# Patient Record
Sex: Male | Born: 1955
Health system: Southern US, Community
[De-identification: ages and names within clinical notes are randomized; demographics above are authoritative.]

## PROBLEM LIST (undated history)

## (undated) DIAGNOSIS — E119 Type 2 diabetes mellitus without complications: Secondary | ICD-10-CM

## (undated) DIAGNOSIS — I10 Essential (primary) hypertension: Secondary | ICD-10-CM

## (undated) DIAGNOSIS — N179 Acute kidney failure, unspecified: Secondary | ICD-10-CM

## (undated) DIAGNOSIS — J302 Other seasonal allergic rhinitis: Secondary | ICD-10-CM

## (undated) DIAGNOSIS — M1990 Unspecified osteoarthritis, unspecified site: Secondary | ICD-10-CM

## (undated) DIAGNOSIS — K219 Gastro-esophageal reflux disease without esophagitis: Secondary | ICD-10-CM

## (undated) DIAGNOSIS — R55 Syncope and collapse: Secondary | ICD-10-CM

## (undated) DIAGNOSIS — E785 Hyperlipidemia, unspecified: Secondary | ICD-10-CM

## (undated) DIAGNOSIS — J189 Pneumonia, unspecified organism: Secondary | ICD-10-CM

## (undated) HISTORY — PX: BUNIONECTOMY WITH HAMMERTOE RECONSTRUCTION: SHX5600

## (undated) HISTORY — DX: Gastro-esophageal reflux disease without esophagitis: K21.9

## (undated) HISTORY — DX: Essential (primary) hypertension: I10

## (undated) HISTORY — DX: Hyperlipidemia, unspecified: E78.5

## (undated) HISTORY — PX: POLYPECTOMY: SHX149

---

## 2004-09-20 ENCOUNTER — Ambulatory Visit: Payer: Self-pay | Admitting: Internal Medicine

## 2004-09-27 ENCOUNTER — Ambulatory Visit: Payer: Self-pay | Admitting: Internal Medicine

## 2004-10-18 ENCOUNTER — Ambulatory Visit: Payer: Self-pay | Admitting: Gastroenterology

## 2004-10-27 ENCOUNTER — Ambulatory Visit: Payer: Self-pay | Admitting: Gastroenterology

## 2004-10-27 HISTORY — PX: COLONOSCOPY: SHX174

## 2005-09-01 ENCOUNTER — Ambulatory Visit: Payer: Self-pay | Admitting: Internal Medicine

## 2005-09-01 LAB — CONVERTED CEMR LAB: PSA: 0.48 ng/mL

## 2007-02-13 ENCOUNTER — Ambulatory Visit: Payer: Self-pay | Admitting: Internal Medicine

## 2007-03-13 ENCOUNTER — Ambulatory Visit: Payer: Self-pay | Admitting: Internal Medicine

## 2007-03-13 LAB — CONVERTED CEMR LAB
ALT: 28 units/L (ref 0–53)
Albumin: 3.9 g/dL (ref 3.5–5.2)
Basophils Relative: 5.5 % — ABNORMAL HIGH (ref 0.0–1.0)
Bilirubin, Direct: 0.1 mg/dL (ref 0.0–0.3)
Calcium: 9.7 mg/dL (ref 8.4–10.5)
Cholesterol: 142 mg/dL (ref 0–200)
Creatinine, Ser: 1.3 mg/dL (ref 0.4–1.5)
Eosinophils Relative: 3.8 % (ref 0.0–5.0)
GFR calc non Af Amer: 61 mL/min
HCT: 41.6 % (ref 39.0–52.0)
Hemoglobin: 14.4 g/dL (ref 13.0–17.0)
Lymphocytes Relative: 40.1 % (ref 12.0–46.0)
MCHC: 34.7 g/dL (ref 30.0–36.0)
Monocytes Relative: 5.9 % (ref 3.0–11.0)
Neutro Abs: 3.8 10*3/uL (ref 1.4–7.7)
Neutrophils Relative %: 44.7 % (ref 43.0–77.0)
RBC: 4.57 M/uL (ref 4.22–5.81)
RDW: 13.4 % (ref 11.5–14.6)
Sodium: 141 meq/L (ref 135–145)
Total Bilirubin: 0.7 mg/dL (ref 0.3–1.2)
Total CHOL/HDL Ratio: 2.9
VLDL: 20 mg/dL (ref 0–40)

## 2007-03-19 ENCOUNTER — Ambulatory Visit: Payer: Self-pay

## 2007-03-25 ENCOUNTER — Ambulatory Visit: Payer: Self-pay

## 2007-03-28 ENCOUNTER — Ambulatory Visit: Payer: Self-pay | Admitting: Internal Medicine

## 2007-04-01 ENCOUNTER — Encounter: Payer: Self-pay | Admitting: Internal Medicine

## 2007-04-01 DIAGNOSIS — E785 Hyperlipidemia, unspecified: Secondary | ICD-10-CM | POA: Insufficient documentation

## 2007-04-01 DIAGNOSIS — E669 Obesity, unspecified: Secondary | ICD-10-CM | POA: Insufficient documentation

## 2007-04-01 DIAGNOSIS — F411 Generalized anxiety disorder: Secondary | ICD-10-CM | POA: Insufficient documentation

## 2007-04-01 DIAGNOSIS — I10 Essential (primary) hypertension: Secondary | ICD-10-CM | POA: Insufficient documentation

## 2007-09-18 ENCOUNTER — Ambulatory Visit: Payer: Self-pay | Admitting: Internal Medicine

## 2007-09-18 DIAGNOSIS — E118 Type 2 diabetes mellitus with unspecified complications: Secondary | ICD-10-CM | POA: Insufficient documentation

## 2009-06-27 ENCOUNTER — Inpatient Hospital Stay (HOSPITAL_COMMUNITY): Admission: EM | Admit: 2009-06-27 | Discharge: 2009-06-28 | Payer: Self-pay | Admitting: Emergency Medicine

## 2009-07-05 ENCOUNTER — Ambulatory Visit: Payer: Self-pay | Admitting: Internal Medicine

## 2009-07-05 DIAGNOSIS — K5289 Other specified noninfective gastroenteritis and colitis: Secondary | ICD-10-CM | POA: Insufficient documentation

## 2009-09-13 ENCOUNTER — Ambulatory Visit: Payer: Self-pay | Admitting: Internal Medicine

## 2009-09-13 DIAGNOSIS — K6289 Other specified diseases of anus and rectum: Secondary | ICD-10-CM | POA: Insufficient documentation

## 2009-12-29 ENCOUNTER — Encounter: Payer: Self-pay | Admitting: Internal Medicine

## 2010-06-29 ENCOUNTER — Encounter: Payer: Self-pay | Admitting: Internal Medicine

## 2010-09-27 NOTE — Letter (Signed)
Summary: Dermatology/[facility]  Dermatology/[facility]   Imported By: Sherian Rein 01/27/2010 11:54:14  _____________________________________________________________________  External Attachment:    Type:   Image     Comment:   External Document

## 2010-09-27 NOTE — Assessment & Plan Note (Signed)
Summary: DEMANDING APPT TODAY WILL NOT WAIT FOR DR Jonny Ruiz FOR TOMORROW/CD   Vital Signs:  Patient profile:   55 year old male Height:      70 inches (177.80 cm) Weight:      231.8 pounds (105.36 kg) O2 Sat:      99 % on Room air Temp:     97.6 degrees F (36.44 degrees C) oral Pulse rate:   83 / minute BP sitting:   132 / 82  (left arm) Cuff size:   large  Vitals Entered By: Orlan Leavens (September 13, 2009 2:45 PM)  O2 Flow:  Room air CC: ? rash or hemorrhoids Is Patient Diabetic? No   Primary Care Provider:  Corwin Levins MD  CC:  ? rash or hemorrhoids.  History of Present Illness: here today with complaint of rectal pain -   . onset of symptoms was 2 months ago. course has been gradual onset and now occurs in intermittent pattern. problem precipitated by colitis hospitalization 06/2009 (hosp dc summ reviewed) symptom characterized as burning and itching in rectum problem associated with pain during BM but not associated with bleeding, constipation or fever. symptoms improved by nothing - tried OTC Prep H and hydrocortisone cream for rash. symptoms worsened with activity such as working out (running, [employer]). no prior hx of same symptoms before 06/2009 hospitalization.  reports normal colo 2006  Clinical Review Panels:  Prevention   Last Colonoscopy:  Done (10/27/2004)   Last PSA:  0.48 (09/01/2005)  CBC   WBC:  8.5 (03/13/2007)   RBC:  4.57 (03/13/2007)   Hgb:  14.4 (03/13/2007)   Hct:  41.6 (03/13/2007)   Platelets:  197 (03/13/2007)   MCV  90.8 (03/13/2007)   MCHC  34.7 (03/13/2007)   RDW  13.4 (03/13/2007)   PMN:  44.7 (03/13/2007)   Lymphs:  40.1 (03/13/2007)   Monos:  5.9 (03/13/2007)   Eosinophils:  3.8 (03/13/2007)   Basophil:  5.5 (03/13/2007)  Complete Metabolic Panel   Glucose:  88 (03/13/2007)   Sodium:  141 (03/13/2007)   Potassium:  4.1 (03/13/2007)   Chloride:  102 (03/13/2007)   CO2:  33 (03/13/2007)   BUN:  18 (03/13/2007)  Creatinine:  1.3 (03/13/2007)   Albumin:  3.9 (03/13/2007)   Total Protein:  7.4 (03/13/2007)   Calcium:  9.7 (03/13/2007)   Total Bili:  0.7 (03/13/2007)   Alk Phos:  70 (03/13/2007)   SGPT (ALT):  28 (03/13/2007)   SGOT (AST):  34 (03/13/2007)   Current Medications (verified): 1)  Ranitidine Hcl 150 Mg  Tabs (Ranitidine Hcl) .Marland Kitchen.. 1po Qd 2)  Pravastatin Sodium 40 Mg  Tabs (Pravastatin Sodium) .Marland Kitchen.. 1 By Mouth Qd 3)  Lisinopril 40 Mg  Tabs (Lisinopril) .Marland Kitchen.. 1 By Mouth Qd 4)  Hydrochlorothiazide 25 Mg  Tabs (Hydrochlorothiazide) .... Take 1 Tab By Mouth Every Morning  Allergies (verified): No Known Drug Allergies  Past History:  Past Medical History: Last updated: 09/18/2007 Anxiety Hyperlipidemia Hypertension Obesity Diabetes mellitus, type II hx of shingles  Review of Systems  The patient denies fever, chest pain, and headaches.    Physical Exam  General:  Well-developed,well-nourished,in no acute distress; alert,appropriate and cooperative throughout examination Lungs:  Normal respiratory effort, chest expands symmetrically. Lungs are clear to auscultation, no crackles or wheezes. Heart:  Normal rate and regular rhythm. S1 and S2 normal without gallop, murmur, click, rub or other extra sounds. Abdomen:  soft, non-tender, normal bowel sounds, no distention; no masses and  no appreciable hepatomegaly or splenomegaly.   Rectal:  no external abnormalities, no hemorrhoids, normal sphincter tone, no masses, and no perianal rash.  FOB negative (dark color, thin stool) - pain with digital exam Prostate:  Prostate gland firm and smooth, no enlargement, nodularity, tenderness, mass, asymmetry or induration. Neurologic:  mild flatness to right facial muscles and nasolabial fold  (?chronic bell's - pt denies any change in his appearance and face expression)   Impression & Recommendations:  Problem # 1:  ANAL OR RECTAL PAIN (ICD-569.42)  high fissure or hemrrhoid most likely -  only pain appreciated on exam no hx of blood/melena or fever - doubt persisiting infx (hosp dc summary reviewed from 06/2009) will treat with analpram to reduce inflammation -  pt educated on keeping stools soft and avoid constipation/straining if still with symptoms >2 weeks, call for referral to GI as persisitng symptoms since colitis hosp 06/2009 (prior colo 2006 normal)  Orders: Hemoccult Guaiac-1 spec.(in office) ([id number]) Prescription Created Electronically [phone])  Complete Medication List: 1)  Ranitidine Hcl 150 Mg Tabs (Ranitidine hcl) .Marland Kitchen.. 1po qd 2)  Pravastatin Sodium 40 Mg Tabs (Pravastatin sodium) .Marland Kitchen.. 1 by mouth qd 3)  Lisinopril 40 Mg Tabs (Lisinopril) .Marland Kitchen.. 1 by mouth qd 4)  Hydrochlorothiazide 25 Mg Tabs (Hydrochlorothiazide) .... Take 1 tab by mouth every morning 5)  Losartan Potassium 100 Mg Tabs (Losartan potassium) .... Take 1 by mouth qd 6)  Analpram-hc Singles 1-2.5 % Crea (Hydrocortisone ace-pramoxine) .Marland Kitchen.. 1 per rectum two times a day x 10 days  Patient Instructions: 1)  it was good to see you today.  2)  try analpram suppository treatments for your pain -  3)  avoid constipation and straining, may use stool softeners as needed to help with this 4)  if continued symptoms after treatment, call for referral to GI to consider other evaluation and treatment Prescriptions: ANALPRAM-HC SINGLES 1-2.5 % CREA (HYDROCORTISONE ACE-PRAMOXINE) 1 per rectum two times a day x 10 days  #20 x 0   Entered by:   Orlan Leavens   Authorized by:   Newt Lukes MD   Signed by:   Newt Lukes MD on 09/13/2009   Method used:   Electronically to        [facility] [phone]* (retail)       [address]       [place], [place]  [address]       Ph: [phone] or [phone]       Fax: [phone]   RxID:   [id number] ANALPRAM-HC SINGLES 1-2.5 % CREA (HYDROCORTISONE ACE-PRAMOXINE) 1 per rectum two times a day x 10 days  #20 x 0   Entered and Authorized by:   Newt Lukes  MD   Signed by:   Newt Lukes MD on 09/13/2009   Method used:   Historical   RxID:   [id number]

## 2010-10-13 NOTE — Letter (Signed)
Summary: Dermatology/[facility]  Dermatology/[facility]   Imported By: Sherian Rein 10/06/2010 10:42:12  _____________________________________________________________________  External Attachment:    Type:   Image     Comment:   External Document

## 2010-11-30 LAB — CBC
HCT: 43.3 % (ref 39.0–52.0)
MCHC: 33.3 g/dL (ref 30.0–36.0)
RDW: 14.1 % (ref 11.5–15.5)

## 2010-11-30 LAB — DIFFERENTIAL
Basophils Absolute: 0 10*3/uL (ref 0.0–0.1)
Basophils Relative: 0 % (ref 0–1)
Eosinophils Absolute: 0 10*3/uL (ref 0.0–0.7)
Lymphocytes Relative: 22 % (ref 12–46)
Lymphs Abs: 2.1 10*3/uL (ref 0.7–4.0)
Neutrophils Relative %: 72 % (ref 43–77)

## 2010-11-30 LAB — COMPREHENSIVE METABOLIC PANEL
AST: 37 U/L (ref 0–37)
Alkaline Phosphatase: 82 U/L (ref 39–117)
BUN: 11 mg/dL (ref 6–23)
Calcium: 8.6 mg/dL (ref 8.4–10.5)
Chloride: 105 mEq/L (ref 96–112)
Creatinine, Ser: 1.15 mg/dL (ref 0.4–1.5)
GFR calc non Af Amer: >60 mL/min (ref >60)
Glucose, Bld: 123 mg/dL — ABNORMAL HIGH (ref 70–99)
Sodium: 139 mEq/L (ref 135–145)
Total Bilirubin: 0.7 mg/dL (ref 0.3–1.2)
Total Protein: 6.4 g/dL (ref 6.0–8.3)

## 2010-11-30 LAB — MAGNESIUM: Magnesium: 2.1 mg/dL (ref 1.5–2.5)

## 2010-12-01 LAB — URINALYSIS, ROUTINE W REFLEX MICROSCOPIC
Bilirubin Urine: NEGATIVE
Hgb urine dipstick: NEGATIVE
Nitrite: NEGATIVE
Protein, ur: NEGATIVE mg/dL
pH: 7 (ref 5.0–8.0)

## 2010-12-01 LAB — COMPREHENSIVE METABOLIC PANEL
AST: 36 U/L (ref 0–37)
Albumin: 3.8 g/dL (ref 3.5–5.2)
Alkaline Phosphatase: 95 U/L (ref 39–117)
CO2: 28 mEq/L (ref 19–32)
Calcium: 9.1 mg/dL (ref 8.4–10.5)
Glucose, Bld: 130 mg/dL — ABNORMAL HIGH (ref 70–99)
Potassium: 4.2 mEq/L (ref 3.5–5.1)
Sodium: 135 mEq/L (ref 135–145)
Total Bilirubin: 0.8 mg/dL (ref 0.3–1.2)
Total Protein: 7.1 g/dL (ref 6.0–8.3)

## 2010-12-01 LAB — CBC
HCT: 45 % (ref 39.0–52.0)
Hemoglobin: 15.1 g/dL (ref 13.0–17.0)
MCHC: 33.6 g/dL (ref 30.0–36.0)
RBC: 4.85 MIL/uL (ref 4.22–5.81)
RDW: 13.8 % (ref 11.5–15.5)

## 2010-12-01 LAB — HEMOCCULT GUIAC POC 1CARD (OFFICE): Fecal Occult Bld: NEGATIVE

## 2010-12-01 LAB — DIFFERENTIAL: Monocytes Absolute: 0.4 10*3/uL (ref 0.1–1.0)

## 2010-12-01 LAB — URINE MICROSCOPIC-ADD ON

## 2011-01-10 NOTE — Letter (Signed)
March 28, 2007    Brandon Shannon Levins, MD  [address]  [place], [place] [address]   RE:  Brandon Shannon, Shannon  MRN:  [id number]  /  DOB:  1955-12-13   Dear [name],   It was a pleasure to see Brandon Shannon Shannon today at your request because of  recurrent syncope.   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  He is the  father of two, who has a history of syncope that dates back five or ten  years.  These are somewhat different from each other.  The first episode  occurred after having gone to the dentist.  He drove home.  He took pain  medications; he had not eaten that morning.  He then awakened on the  floor.  He is not able to recount any significant prodromal symptoms.  He does not recount any significant recovery symptoms.   His next episode occurred while his wife was cutting his hair.  This is  not a stressful experience for him, as it was for me.  He found himself  sliding to the floor, unconscious.  He was not unconscious long.  Again,  there was no recognizable prodrome and no recognized symptoms from the  recovery phase and no recognition that the recovery phase of these  episodes was similar.   More recently, he got up in the middle of the night to go to the  bathroom, specifically to urinate.  He got halfway to the bathroom,  became very diaphoretic and light-headed.  He slumped to the floor.   He has had another episode of presyncope, the specifics of which he does  not recall, that was associated with a prodrome similar to this last  episode.   His cardiac risk factors are notable for diabetes and hypertension and  you, very appropriately, undertook a cardiac evaluation, including an  ultrasound, that demonstrated normal left ventricular function and a  Myoview that demonstrated soft-tissue attenuation, but no scar or  ischemia.   PAST MEDICAL HISTORY:  In addition to the above, is notable for  allergies.   PAST SURGICAL HISTORY:  Notable for foot surgery.   SOCIAL HISTORY:  As noted previously, he has two children, 12 and 14,  does not use cigarettes, alcohol or recreational drugs.   MEDICATIONS INCLUDE:  1. Zantac.  2. Prevacid.  3. Lisinopril 20.  4. Hydrochlorothiazide 25.  5. Metformin 500.   ALLERGIES:  He has no known drug allergies.   EXAMINATION:  He is a middle-aged African-American male, appearing his  stated age of 35.  His blood pressure was 117/76 with a pulse of 78.  There was minimal  change of orthostasis with a slight increase in heart rate to the mid-  eighties.  His HEENT exam demonstrated no icterus or xanthoma.  The neck veins were flat.  The carotids were brisk and full bilaterally,  without bruits.  The back was without kyphosis or scoliosis.  The lungs were clear.  Heart sounds were regular, without murmurs or gallops.  The abdomen was soft, but protuberant.  No midline pulsation was  appreciated.  There was no hepatomegaly.  Femoral pulses were 2+, distal pulses were intact.  There was no  clubbing, cyanosis or edema.  The neurological exam was grossly normal.  The skin had significant keloid formation on his anterior chest wall  from no specific trauma.   Electrocardiogram from his stress test demonstrated sinus rhythm at 82  with intervals  of 0.16/0.08/0.43.  The electrocardiogram was otherwise  normal.   IMPRESSION:  1. Recurrent syncope, probably neurally mediated.  2. Cardiac risk factors including diabetes and hypertension.  3. Obesity.   [name], I think these episodes are neurally mediated, although the fact  that they lack a stereotypical prodrome is concerning to me.  Still,  things being common, this is the most likely explanation.   From a diagnostic point of view, without things present themselves.  One  is tilt table testing and the other is an implantable loop recorder,  probably to be done in that order.  I have reviewed this with the  patient and he is not interested in pursuing  further diagnostic testing  at this time.  I think that is a not unreasonable decision.   From a therapeutic standpoint, it is hard to know for sure as this  prodrome is so short.  I have encouraged him to try to recognize any  prodromal stigmata, so that he can protect himself from syncope.   I have left him to follow up with you.   We also had a discussion regarding his obesity, diabetes and  hypertension and the importance for exercise.  Specifically now that he  is unemployed, I have encouraged him to find time to exercise on a  regular basis, trying to accomplish an hour a day.   I have also asked him to speak with you and with his wife as to whether  he may have obstructive sleep apnea and whether we might be able to  ameliorate some of his symptoms thereby.   Thanks for the consultation.    Sincerely,      Brandon Shannon Salvia, MD, [facility]  Electronically Signed    SCK/MedQ  DD: 03/28/2007  DT: 03/29/2007  Job #: [id number]

## 2011-09-19 ENCOUNTER — Encounter: Payer: Self-pay | Admitting: Gastroenterology

## 2012-07-25 LAB — HM DIABETES EYE EXAM: HM Diabetic Eye Exam: NORMAL

## 2013-03-31 ENCOUNTER — Telehealth: Payer: Self-pay | Admitting: Internal Medicine

## 2013-03-31 NOTE — Telephone Encounter (Signed)
Patient would like to switch from Dr. Jonny Ruiz to Dr. Yetta Barre because that is who is wife is seeing, please advise

## 2013-03-31 NOTE — Telephone Encounter (Signed)
Ok with me 

## 2013-03-31 NOTE — Telephone Encounter (Signed)
ok 

## 2013-03-31 NOTE — Telephone Encounter (Signed)
lmom to call

## 2013-04-02 ENCOUNTER — Other Ambulatory Visit (INDEPENDENT_AMBULATORY_CARE_PROVIDER_SITE_OTHER)

## 2013-04-02 ENCOUNTER — Ambulatory Visit (INDEPENDENT_AMBULATORY_CARE_PROVIDER_SITE_OTHER): Admitting: Internal Medicine

## 2013-04-02 ENCOUNTER — Encounter: Payer: Self-pay | Admitting: Internal Medicine

## 2013-04-02 VITALS — BP 118/72 | HR 78 | Temp 97.0°F | Resp 16 | Ht 69.0 in | Wt 238.0 lb

## 2013-04-02 DIAGNOSIS — E785 Hyperlipidemia, unspecified: Secondary | ICD-10-CM

## 2013-04-02 DIAGNOSIS — R109 Unspecified abdominal pain: Secondary | ICD-10-CM

## 2013-04-02 DIAGNOSIS — Z136 Encounter for screening for cardiovascular disorders: Secondary | ICD-10-CM

## 2013-04-02 DIAGNOSIS — E669 Obesity, unspecified: Secondary | ICD-10-CM

## 2013-04-02 DIAGNOSIS — IMO0001 Reserved for inherently not codable concepts without codable children: Secondary | ICD-10-CM

## 2013-04-02 DIAGNOSIS — I10 Essential (primary) hypertension: Secondary | ICD-10-CM

## 2013-04-02 DIAGNOSIS — Z23 Encounter for immunization: Secondary | ICD-10-CM

## 2013-04-02 DIAGNOSIS — E559 Vitamin D deficiency, unspecified: Secondary | ICD-10-CM | POA: Insufficient documentation

## 2013-04-02 LAB — COMPREHENSIVE METABOLIC PANEL
ALT: 28 U/L (ref 0–53)
Albumin: 4.1 g/dL (ref 3.5–5.2)
Alkaline Phosphatase: 82 U/L (ref 39–117)
Glucose, Bld: 84 mg/dL (ref 70–99)
Potassium: 3.7 mEq/L (ref 3.5–5.1)
Sodium: 139 mEq/L (ref 135–145)
Total Protein: 7.8 g/dL (ref 6.0–8.3)

## 2013-04-02 LAB — URINALYSIS, ROUTINE W REFLEX MICROSCOPIC
Bilirubin Urine: NEGATIVE
Hgb urine dipstick: NEGATIVE
Nitrite: NEGATIVE
Urobilinogen, UA: 0.2 (ref 0.0–1.0)

## 2013-04-02 LAB — CBC
BASO#: 0 /uL
BASO%: 0 %
EOS%: 3 %
HCT: 44 %
MCHC: 30.2
MCV: 87.7 fL
MONO%: 6 %
NEUT#: 3.38
NEUT%: 46 %
Platelets: 178 10*3/uL
RBC: 5.04
RDW-CV: 13.2
lymph#: 3.18

## 2013-04-02 LAB — BASIC METABOLIC PANEL
Albumin: 3.8
Glucose: 110
Urea Nitrogen: 13

## 2013-04-02 LAB — HEPATIC FUNCTION PANEL
AST: 25 U/L
Albumin: 3.8
Alkaline Phosphatase: 112 U/L
Bilirubin, Direct: 0.1 mg/dL (ref 0.01–0.4)
Protein: 8.3

## 2013-04-02 LAB — CBC WITH DIFFERENTIAL/PLATELET
Basophils Absolute: 0.1 10*3/uL (ref 0.0–0.1)
Eosinophils Absolute: 0.4 10*3/uL (ref 0.0–0.7)
Lymphs Abs: 3.5 10*3/uL (ref 0.7–4.0)
MCHC: 33.6 g/dL (ref 30.0–36.0)
MCV: 90.7 fl (ref 78.0–100.0)
Monocytes Absolute: 0.6 10*3/uL (ref 0.1–1.0)
Neutrophils Relative %: 51.9 % (ref 43.0–77.0)
Platelets: 199 10*3/uL (ref 150.0–400.0)
RDW: 13.8 % (ref 11.5–14.6)
WBC: 9.5 10*3/uL (ref 4.5–10.5)

## 2013-04-02 LAB — PSA: PSA: 0.594

## 2013-04-02 LAB — LIPID PANEL
Cholesterol, Total: 219
HDL: 62 mg/dL (ref 35–70)

## 2013-04-02 LAB — HM DIABETES FOOT EXAM

## 2013-04-02 LAB — AMYLASE: Amylase: 103 U/L (ref 27–131)

## 2013-04-02 MED ORDER — CHOLECALCIFEROL 1.25 MG (50000 UT) PO TABS: 1.0000 | ORAL_TABLET | ORAL | Status: DC

## 2013-04-02 MED ORDER — PITAVASTATIN CALCIUM 4 MG PO TABS: 1.0000 | ORAL_TABLET | Freq: Every day | ORAL | Status: DC

## 2013-04-02 NOTE — Progress Notes (Signed)
Subjective:    Patient ID: Brandon Shannon, male    DOB: [birth year], 56 y.o.   MRN: [id number]  HPI Comments: He was cutting the grass last weekend and while exerting himself he developed nausea, vomiting, loose BM, and lightheadedness. Those symptoms have since resolved.  Abdominal Pain This is a recurrent problem. The current episode started more than 1 month ago. The onset quality is gradual. The problem occurs intermittently. The problem has been gradually worsening. The pain is located in the generalized abdominal region. The pain is at a severity of 2/10. The pain is mild. The quality of the pain is aching and sharp. The abdominal pain does not radiate. Associated symptoms include nausea and vomiting. Pertinent negatives include no anorexia, arthralgias, belching, constipation, diarrhea, dysuria, fever, flatus, frequency, headaches, hematochezia, hematuria or myalgias. Associated symptoms comments: He had an episode of nausea and vomiting 3 days ago and since then he has felt fatigued and weak..      Review of Systems  Constitutional: Negative.  Negative for fever, chills, diaphoresis, appetite change and fatigue.  HENT: Negative.   Eyes: Negative.   Respiratory: Negative.  Negative for cough, chest tightness, shortness of breath, wheezing and stridor.   Cardiovascular: Negative.  Negative for chest pain, palpitations and leg swelling.  Gastrointestinal: Positive for nausea, vomiting and abdominal pain. Negative for diarrhea, constipation, hematochezia, abdominal distention, anal bleeding, anorexia and flatus.  Endocrine: Negative.  Negative for polydipsia and polyuria.  Genitourinary: Negative.  Negative for dysuria, frequency and hematuria.  Musculoskeletal: Negative.  Negative for myalgias, back pain, joint swelling, arthralgias and gait problem.  Skin: Negative.   Allergic/Immunologic: Negative.   Neurological: Positive for dizziness and light-headedness. Negative for tremors,  seizures, syncope, facial asymmetry, speech difficulty, weakness, numbness and headaches.  Hematological: Negative.  Negative for adenopathy. Does not bruise/bleed easily.  Psychiatric/Behavioral: Negative.        Objective:   Physical Exam  Vitals reviewed. Constitutional: He is oriented to person, place, and time. He appears well-developed and well-nourished. No distress.  HENT:  Head: Normocephalic and atraumatic.  Mouth/Throat: Oropharynx is clear and moist. No oropharyngeal exudate.  Eyes: Conjunctivae are normal. Right eye exhibits no discharge. Left eye exhibits no discharge. No scleral icterus.  Neck: Normal range of motion. Neck supple. No JVD present. No tracheal deviation present. No thyromegaly present.  Cardiovascular: Normal rate, regular rhythm, normal heart sounds and intact distal pulses.  Exam reveals no gallop and no friction rub.   No murmur heard. Pulmonary/Chest: Effort normal and breath sounds normal. No stridor. No respiratory distress. He has no wheezes. He has no rales. He exhibits no tenderness.  Abdominal: Soft. Bowel sounds are normal. He exhibits no distension and no mass. There is no tenderness. There is no rebound and no guarding.  Musculoskeletal: Normal range of motion. He exhibits no edema and no tenderness.  Lymphadenopathy:    He has no cervical adenopathy.  Neurological: He is oriented to person, place, and time.  Skin: Skin is warm and dry. No rash noted. He is not diaphoretic. No erythema. No pallor.  Psychiatric: He has a normal mood and affect. His behavior is normal. Judgment and thought content normal.     Lab Results  Component Value Date   WBC 7.28 03/13/2013   HGB 15.2 03/13/2013   HCT 44 03/13/2013   PLT 178 03/13/2013   GLUCOSE 123* 06/28/2009   CHOL 142 03/13/2007   TRIG 78 03/13/2013   HDL 62 03/13/2013   LDLCALC  141 03/13/2013   ALT 27 06/28/2009   AST 37 06/28/2009   NA 139 06/28/2009   K 4.4 06/28/2009   CL 105 06/28/2009    CREATININE 1.15 06/28/2009   BUN 11 06/28/2009   CO2 27 06/28/2009   TSH 0.54 03/13/2007   PSA 0.594 03/13/2013   HGBA1C 6.2* 03/13/2007       Assessment & Plan:

## 2013-04-02 NOTE — Patient Instructions (Signed)

## 2013-04-03 ENCOUNTER — Telehealth: Payer: Self-pay | Admitting: *Deleted

## 2013-04-03 DIAGNOSIS — E785 Hyperlipidemia, unspecified: Secondary | ICD-10-CM

## 2013-04-03 DIAGNOSIS — IMO0001 Reserved for inherently not codable concepts without codable children: Secondary | ICD-10-CM

## 2013-04-03 MED ORDER — ATORVASTATIN CALCIUM 80 MG PO TABS: 80.0000 mg | ORAL_TABLET | Freq: Every day | ORAL | Status: DC

## 2013-04-03 NOTE — Telephone Encounter (Signed)
Please call in the atorvastatin

## 2013-04-03 NOTE — Telephone Encounter (Signed)
Insurance is requiring a prior auth for Livalo 4mg . They are requiring that the pt must try one of the following meds first: Atorvastatin Atorvastatin-amlodipine Lovastatin Pravastatin simvastatin

## 2013-04-03 NOTE — Telephone Encounter (Signed)
rx called into pharmacy

## 2013-04-04 NOTE — Assessment & Plan Note (Addendum)
His BP is well controlled His EKG is normal

## 2013-04-04 NOTE — Assessment & Plan Note (Addendum)
His exam is benign today Labs are normal I will order an u/s to see if he has gallstones

## 2013-04-04 NOTE — Assessment & Plan Note (Signed)
I have asked him to start lipitor

## 2013-04-04 NOTE — Assessment & Plan Note (Signed)
He will work on his lifestyle modifications to lose weight 

## 2013-04-04 NOTE — Assessment & Plan Note (Signed)
He will start cholecalciferol

## 2013-04-04 NOTE — Assessment & Plan Note (Signed)
His recent A1C shows good control of the blood sugar

## 2013-04-10 ENCOUNTER — Encounter: Payer: Self-pay | Admitting: Internal Medicine

## 2013-04-10 ENCOUNTER — Ambulatory Visit
Admission: RE | Admit: 2013-04-10 | Discharge: 2013-04-10 | Disposition: A | Discharge status: Home / Self Care | Source: Ambulatory Visit | Attending: Internal Medicine | Admitting: Internal Medicine

## 2013-04-10 DIAGNOSIS — R109 Unspecified abdominal pain: Secondary | ICD-10-CM

## 2013-04-21 ENCOUNTER — Ambulatory Visit (INDEPENDENT_AMBULATORY_CARE_PROVIDER_SITE_OTHER): Admitting: Internal Medicine

## 2013-04-21 ENCOUNTER — Encounter: Payer: Self-pay | Admitting: Internal Medicine

## 2013-04-21 VITALS — BP 116/70 | HR 77 | Temp 98.7°F | Resp 16 | Ht 71.0 in | Wt 240.0 lb

## 2013-04-21 DIAGNOSIS — E785 Hyperlipidemia, unspecified: Secondary | ICD-10-CM

## 2013-04-21 DIAGNOSIS — IMO0001 Reserved for inherently not codable concepts without codable children: Secondary | ICD-10-CM

## 2013-04-21 DIAGNOSIS — E669 Obesity, unspecified: Secondary | ICD-10-CM

## 2013-04-21 DIAGNOSIS — I10 Essential (primary) hypertension: Secondary | ICD-10-CM

## 2013-04-21 MED ORDER — ATORVASTATIN CALCIUM 80 MG PO TABS: 40.0000 mg | ORAL_TABLET | Freq: Every day | ORAL | Status: DC

## 2013-04-21 NOTE — Assessment & Plan Note (Signed)
He agrees to improve his lifestyle modifications

## 2013-04-21 NOTE — Assessment & Plan Note (Signed)
His BP is well controlled 

## 2013-04-21 NOTE — Patient Instructions (Signed)
Type 2 Diabetes Mellitus, Adult Type 2 diabetes mellitus, often simply referred to as type 2 diabetes, is a long-lasting (chronic) disease. In type 2 diabetes, the pancreas does not make enough insulin (a hormone), the cells are less responsive to the insulin that is made (insulin resistance), or both. Normally, insulin moves sugars from food into the tissue cells. The tissue cells use the sugars for energy. The lack of insulin or the lack of normal response to insulin causes excess sugars to build up in the blood instead of going into the tissue cells. As a result, high blood sugar (hyperglycemia) develops. The effect of high sugar (glucose) levels can cause many complications. Type 2 diabetes was also previously called adult-onset diabetes but it can occur at any age.  RISK FACTORS  A person is predisposed to developing type 2 diabetes if someone in the family has the disease and also has one or more of the following primary risk factors:  Overweight.  An inactive lifestyle.  A history of consistently eating high-calorie foods. Maintaining a normal weight and regular physical activity can reduce the chance of developing type 2 diabetes. SYMPTOMS  A person with type 2 diabetes may not show symptoms initially. The symptoms of type 2 diabetes appear slowly. The symptoms include:  Increased thirst (polydipsia).  Increased urination (polyuria).  Increased urination during the night (nocturia).  Weight loss. This weight loss may be rapid.  Frequent, recurring infections.  Tiredness (fatigue).  Weakness.  Vision changes, such as blurred vision.  Fruity smell to your breath.  Abdominal pain.  Nausea or vomiting.  Cuts or bruises which are slow to heal.  Tingling or numbness in the hands or feet. DIAGNOSIS Type 2 diabetes is frequently not diagnosed until complications of diabetes are present. Type 2 diabetes is diagnosed when symptoms or complications are present and when blood  glucose levels are increased. Your blood glucose level may be checked by one or more of the following blood tests:  A fasting blood glucose test. You will not be allowed to eat for at least 8 hours before a blood sample is taken.  A random blood glucose test. Your blood glucose is checked at any time of the day regardless of when you ate.  A hemoglobin A1c blood glucose test. A hemoglobin A1c test provides information about blood glucose control over the previous 3 months.  An oral glucose tolerance test (OGTT). Your blood glucose is measured after you have not eaten (fasted) for 2 hours and then after you drink a glucose-containing beverage. TREATMENT   You may need to take insulin or diabetes medicine daily to keep blood glucose levels in the desired range.  You will need to match insulin dosing with exercise and healthy food choices. The treatment goal is to maintain the before meal blood sugar (preprandial glucose) level at 70 130 mg/dL. HOME CARE INSTRUCTIONS   Have your hemoglobin A1c level checked twice a year.  Perform daily blood glucose monitoring as directed by your caregiver.  Monitor urine ketones when you are ill and as directed by your caregiver.  Take your diabetes medicine or insulin as directed by your caregiver to maintain your blood glucose levels in the desired range.  Never run out of diabetes medicine or insulin. It is needed every day.  Adjust insulin based on your intake of carbohydrates. Carbohydrates can raise blood glucose levels but need to be included in your diet. Carbohydrates provide vitamins, minerals, and fiber which are an essential part of   a healthy diet. Carbohydrates are found in fruits, vegetables, whole grains, dairy products, legumes, and foods containing added sugars.    Eat healthy foods. Alternate 3 meals with 3 snacks.  Lose weight if overweight.  Carry a medical alert card or wear your medical alert jewelry.  Carry a 15 gram  carbohydrate snack with you at all times to treat low blood glucose (hypoglycemia). Some examples of 15 gram carbohydrate snacks include:  Glucose tablets, 3 or 4   Glucose gel, 15 gram tube  Raisins, 2 tablespoons (24 grams)  Jelly beans, 6  Animal crackers, 8  Regular pop, 4 ounces (120 mL)  Gummy treats, 9  Recognize hypoglycemia. Hypoglycemia occurs with blood glucose levels of 70 mg/dL and below. The risk for hypoglycemia increases when fasting or skipping meals, during or after intense exercise, and during sleep. Hypoglycemia symptoms can include:  Tremors or shakes.  Decreased ability to concentrate.  Sweating.  Increased heart rate.  Headache.  Dry mouth.  Hunger.  Irritability.  Anxiety.  Restless sleep.  Altered speech or coordination.  Confusion.  Treat hypoglycemia promptly. If you are alert and able to safely swallow, follow the 15:15 rule:  Take 15 20 grams of rapid-acting glucose or carbohydrate. Rapid-acting options include glucose gel, glucose tablets, or 4 ounces (120 mL) of fruit juice, regular soda, or low fat milk.  Check your blood glucose level 15 minutes after taking the glucose.  Take 15 20 grams more of glucose if the repeat blood glucose level is still 70 mg/dL or below.  Eat a meal or snack within 1 hour once blood glucose levels return to normal.    Be alert to polyuria and polydipsia which are early signs of hyperglycemia. An early awareness of hyperglycemia allows for prompt treatment. Treat hyperglycemia as directed by your caregiver.  Engage in at least 150 minutes of moderate-intensity physical activity a week, spread over at least 3 days of the week or as directed by your caregiver. In addition, you should engage in resistance exercise at least 2 times a week or as directed by your caregiver.  Adjust your medicine and food intake as needed if you start a new exercise or sport.  Follow your sick day plan at any time you  are unable to eat or drink as usual.  Avoid tobacco use.  Limit alcohol intake to no more than 1 drink per day for nonpregnant women and 2 drinks per day for men. You should drink alcohol only when you are also eating food. Talk with your caregiver whether alcohol is safe for you. Tell your caregiver if you drink alcohol several times a week.  Follow up with your caregiver regularly.  Schedule an eye exam soon after the diagnosis of type 2 diabetes and then annually.  Perform daily skin and foot care. Examine your skin and feet daily for cuts, bruises, redness, nail problems, bleeding, blisters, or sores. A foot exam by a caregiver should be done annually.  Brush your teeth and gums at least twice a day and floss at least once a day. Follow up with your dentist regularly.  Share your diabetes management plan with your workplace or school.  Stay up-to-date with immunizations.  Learn to manage stress.  Obtain ongoing diabetes education and support as needed.  Participate in, or seek rehabilitation as needed to maintain or improve independence and quality of life. Request a physical or occupational therapy referral if you are having foot or hand numbness or difficulties with grooming,   dressing, eating, or physical activity. SEEK MEDICAL CARE IF:   You are unable to eat food or drink fluids for more than 6 hours.  You have nausea and vomiting for more than 6 hours.  Your blood glucose level is over 240 mg/dL.  There is a change in mental status.  You develop an additional serious illness.  You have diarrhea for more than 6 hours.  You have been sick or have had a fever for a couple of days and are not getting better.  You have pain during any physical activity.  SEEK IMMEDIATE MEDICAL CARE IF:  You have difficulty breathing.  You have moderate to large ketone levels. MAKE SURE YOU:  Understand these instructions.  Will watch your condition.  Will get help right away if  you are not doing well or get worse. Document Released: 08/14/2005 Document Revised: 05/08/2012 Document Reviewed: 03/12/2012 [employer] Patient Information 2014 [employer].  

## 2013-04-21 NOTE — Assessment & Plan Note (Signed)
No meds needed at this time He will work on his lifestyle modifications

## 2013-04-21 NOTE — Progress Notes (Signed)
  Subjective:    Patient ID: Brandon Shannon, male    DOB: Sep 26, 1955, 57 y.o.   MRN: [id number]  Hypertension This is a chronic problem. The current episode started more than 1 year ago. The problem has been gradually improving since onset. The problem is controlled. Pertinent negatives include no anxiety, blurred vision, chest pain, headaches, malaise/fatigue, neck pain, orthopnea, palpitations, peripheral edema, PND, shortness of breath or sweats. Past treatments include angiotensin blockers and diuretics. The current treatment provides significant improvement. Compliance problems include exercise and diet.       Review of Systems  Constitutional: Negative.  Negative for malaise/fatigue.  HENT: Negative.  Negative for neck pain.   Eyes: Negative.  Negative for blurred vision.  Respiratory: Negative.  Negative for apnea, cough, choking, chest tightness, shortness of breath, wheezing and stridor.   Cardiovascular: Negative.  Negative for chest pain, palpitations, orthopnea, leg swelling and PND.  Gastrointestinal: Negative.  Negative for nausea, vomiting, abdominal pain, diarrhea and constipation.  Endocrine: Negative.  Negative for polydipsia, polyphagia and polyuria.  Genitourinary: Negative.   Musculoskeletal: Negative.   Skin: Negative.   Allergic/Immunologic: Negative.   Neurological: Negative.  Negative for dizziness, syncope, weakness, light-headedness and headaches.  Hematological: Negative.  Negative for adenopathy. Does not bruise/bleed easily.  Psychiatric/Behavioral: Negative.        Objective:   Physical Exam  Vitals reviewed. Constitutional: He is oriented to person, place, and time. He appears well-developed and well-nourished. No distress.  HENT:  Head: Normocephalic and atraumatic.  Mouth/Throat: Oropharynx is clear and moist. No oropharyngeal exudate.  Eyes: Conjunctivae are normal. Right eye exhibits no discharge. Left eye exhibits no discharge. No scleral icterus.   Neck: Normal range of motion. Neck supple. No JVD present. No tracheal deviation present. No thyromegaly present.  Cardiovascular: Normal rate, regular rhythm, normal heart sounds and intact distal pulses.  Exam reveals no gallop and no friction rub.   No murmur heard. Pulmonary/Chest: Effort normal and breath sounds normal. No stridor. No respiratory distress. He has no wheezes. He has no rales. He exhibits no tenderness.  Abdominal: Soft. Bowel sounds are normal. He exhibits no distension and no mass. There is no tenderness. There is no rebound and no guarding.  Musculoskeletal: Normal range of motion. He exhibits no edema and no tenderness.  Lymphadenopathy:    He has no cervical adenopathy.  Neurological: He is oriented to person, place, and time.  Skin: Skin is warm and dry. No rash noted. He is not diaphoretic. No erythema. No pallor.  Psychiatric: He has a normal mood and affect. His behavior is normal. Judgment and thought content normal.     Lab Results  Component Value Date   WBC 9.5 04/02/2013   HGB 15.3 04/02/2013   HCT 45.6 04/02/2013   PLT 199.0 04/02/2013   GLUCOSE 84 04/02/2013   CHOL 142 03/13/2007   TRIG 78 03/13/2013   HDL 62 03/13/2013   LDLCALC 141 03/13/2013   ALT 28 04/02/2013   AST 25 04/02/2013   NA 139 04/02/2013   K 3.7 04/02/2013   CL 104 04/02/2013   CREATININE 1.2 04/02/2013   BUN 18 04/02/2013   CO2 27 04/02/2013   TSH 0.54 03/13/2007   PSA 0.594 03/13/2013   HGBA1C 6.5* 03/13/2013       Assessment & Plan:

## 2013-07-31 ENCOUNTER — Other Ambulatory Visit: Payer: Self-pay | Admitting: Internal Medicine

## 2013-09-02 ENCOUNTER — Encounter: Payer: Self-pay | Admitting: Internal Medicine

## 2013-09-02 ENCOUNTER — Other Ambulatory Visit (INDEPENDENT_AMBULATORY_CARE_PROVIDER_SITE_OTHER)

## 2013-09-02 ENCOUNTER — Ambulatory Visit (INDEPENDENT_AMBULATORY_CARE_PROVIDER_SITE_OTHER): Admitting: Internal Medicine

## 2013-09-02 VITALS — BP 126/82 | HR 64 | Temp 97.5°F | Resp 16 | Ht 70.0 in | Wt 242.0 lb

## 2013-09-02 DIAGNOSIS — E559 Vitamin D deficiency, unspecified: Secondary | ICD-10-CM

## 2013-09-02 DIAGNOSIS — E785 Hyperlipidemia, unspecified: Secondary | ICD-10-CM

## 2013-09-02 DIAGNOSIS — IMO0001 Reserved for inherently not codable concepts without codable children: Secondary | ICD-10-CM

## 2013-09-02 DIAGNOSIS — E1165 Type 2 diabetes mellitus with hyperglycemia: Principal | ICD-10-CM

## 2013-09-02 DIAGNOSIS — I10 Essential (primary) hypertension: Secondary | ICD-10-CM

## 2013-09-02 LAB — BASIC METABOLIC PANEL
BUN: 14 mg/dL (ref 6–23)
CALCIUM: 9.3 mg/dL (ref 8.4–10.5)
CO2: 29 meq/L (ref 19–32)
CREATININE: 1.2 mg/dL (ref 0.4–1.5)
Chloride: 103 mEq/L (ref 96–112)
GFR: 80.32 mL/min (ref >60.00)
Glucose, Bld: 130 mg/dL — ABNORMAL HIGH (ref 70–99)
Potassium: 4.1 mEq/L (ref 3.5–5.1)
Sodium: 137 mEq/L (ref 135–145)

## 2013-09-02 LAB — HEMOGLOBIN A1C: Hgb A1c MFr Bld: 6.7 % — ABNORMAL HIGH (ref 4.6–6.5)

## 2013-09-02 LAB — LIPID PANEL
Cholesterol: 133 mg/dL (ref 0–200)
HDL: 52 mg/dL (ref >39.00)
LDL Cholesterol: 70 mg/dL (ref 0–99)
TRIGLYCERIDES: 54 mg/dL (ref 0.0–149.0)
Total CHOL/HDL Ratio: 3
VLDL: 10.8 mg/dL (ref 0.0–40.0)

## 2013-09-02 NOTE — Patient Instructions (Signed)
Type 2 Diabetes Mellitus, Adult Type 2 diabetes mellitus, often simply referred to as type 2 diabetes, is a long-lasting (chronic) disease. In type 2 diabetes, the pancreas does not make enough insulin (a hormone), the cells are less responsive to the insulin that is made (insulin resistance), or both. Normally, insulin moves sugars from food into the tissue cells. The tissue cells use the sugars for energy. The lack of insulin or the lack of normal response to insulin causes excess sugars to build up in the blood instead of going into the tissue cells. As a result, high blood sugar (hyperglycemia) develops. The effect of high sugar (glucose) levels can cause many complications. Type 2 diabetes was also previously called adult-onset diabetes but it can occur at any age.  RISK FACTORS  A person is predisposed to developing type 2 diabetes if someone in the family has the disease and also has one or more of the following primary risk factors:  Overweight.  An inactive lifestyle.  A history of consistently eating high-calorie foods. Maintaining a normal weight and regular physical activity can reduce the chance of developing type 2 diabetes. SYMPTOMS  A person with type 2 diabetes may not show symptoms initially. The symptoms of type 2 diabetes appear slowly. The symptoms include:  Increased thirst (polydipsia).  Increased urination (polyuria).  Increased urination during the night (nocturia).  Weight loss. This weight loss may be rapid.  Frequent, recurring infections.  Tiredness (fatigue).  Weakness.  Vision changes, such as blurred vision.  Fruity smell to your breath.  Abdominal pain.  Nausea or vomiting.  Cuts or bruises which are slow to heal.  Tingling or numbness in the hands or feet. DIAGNOSIS Type 2 diabetes is frequently not diagnosed until complications of diabetes are present. Type 2 diabetes is diagnosed when symptoms or complications are present and when blood  glucose levels are increased. Your blood glucose level may be checked by one or more of the following blood tests:  A fasting blood glucose test. You will not be allowed to eat for at least 8 hours before a blood sample is taken.  A random blood glucose test. Your blood glucose is checked at any time of the day regardless of when you ate.  A hemoglobin A1c blood glucose test. A hemoglobin A1c test provides information about blood glucose control over the previous 3 months.  An oral glucose tolerance test (OGTT). Your blood glucose is measured after you have not eaten (fasted) for 2 hours and then after you drink a glucose-containing beverage. TREATMENT   You may need to take insulin or diabetes medicine daily to keep blood glucose levels in the desired range.  You will need to match insulin dosing with exercise and healthy food choices. The treatment goal is to maintain the before meal blood sugar (preprandial glucose) level at 70 130 mg/dL. HOME CARE INSTRUCTIONS   Have your hemoglobin A1c level checked twice a year.  Perform daily blood glucose monitoring as directed by your caregiver.  Monitor urine ketones when you are ill and as directed by your caregiver.  Take your diabetes medicine or insulin as directed by your caregiver to maintain your blood glucose levels in the desired range.  Never run out of diabetes medicine or insulin. It is needed every day.  Adjust insulin based on your intake of carbohydrates. Carbohydrates can raise blood glucose levels but need to be included in your diet. Carbohydrates provide vitamins, minerals, and fiber which are an essential part of   a healthy diet. Carbohydrates are found in fruits, vegetables, whole grains, dairy products, legumes, and foods containing added sugars.    Eat healthy foods. Alternate 3 meals with 3 snacks.  Lose weight if overweight.  Carry a medical alert card or wear your medical alert jewelry.  Carry a 15 gram  carbohydrate snack with you at all times to treat low blood glucose (hypoglycemia). Some examples of 15 gram carbohydrate snacks include:  Glucose tablets, 3 or 4   Glucose gel, 15 gram tube  Raisins, 2 tablespoons (24 grams)  Jelly beans, 6  Animal crackers, 8  Regular pop, 4 ounces (120 mL)  Gummy treats, 9  Recognize hypoglycemia. Hypoglycemia occurs with blood glucose levels of 70 mg/dL and below. The risk for hypoglycemia increases when fasting or skipping meals, during or after intense exercise, and during sleep. Hypoglycemia symptoms can include:  Tremors or shakes.  Decreased ability to concentrate.  Sweating.  Increased heart rate.  Headache.  Dry mouth.  Hunger.  Irritability.  Anxiety.  Restless sleep.  Altered speech or coordination.  Confusion.  Treat hypoglycemia promptly. If you are alert and able to safely swallow, follow the 15:15 rule:  Take 15 20 grams of rapid-acting glucose or carbohydrate. Rapid-acting options include glucose gel, glucose tablets, or 4 ounces (120 mL) of fruit juice, regular soda, or low fat milk.  Check your blood glucose level 15 minutes after taking the glucose.  Take 15 20 grams more of glucose if the repeat blood glucose level is still 70 mg/dL or below.  Eat a meal or snack within 1 hour once blood glucose levels return to normal.    Be alert to polyuria and polydipsia which are early signs of hyperglycemia. An early awareness of hyperglycemia allows for prompt treatment. Treat hyperglycemia as directed by your caregiver.  Engage in at least 150 minutes of moderate-intensity physical activity a week, spread over at least 3 days of the week or as directed by your caregiver. In addition, you should engage in resistance exercise at least 2 times a week or as directed by your caregiver.  Adjust your medicine and food intake as needed if you start a new exercise or sport.  Follow your sick day plan at any time you  are unable to eat or drink as usual.  Avoid tobacco use.  Limit alcohol intake to no more than 1 drink per day for nonpregnant women and 2 drinks per day for men. You should drink alcohol only when you are also eating food. Talk with your caregiver whether alcohol is safe for you. Tell your caregiver if you drink alcohol several times a week.  Follow up with your caregiver regularly.  Schedule an eye exam soon after the diagnosis of type 2 diabetes and then annually.  Perform daily skin and foot care. Examine your skin and feet daily for cuts, bruises, redness, nail problems, bleeding, blisters, or sores. A foot exam by a caregiver should be done annually.  Brush your teeth and gums at least twice a day and floss at least once a day. Follow up with your dentist regularly.  Share your diabetes management plan with your workplace or school.  Stay up-to-date with immunizations.  Learn to manage stress.  Obtain ongoing diabetes education and support as needed.  Participate in, or seek rehabilitation as needed to maintain or improve independence and quality of life. Request a physical or occupational therapy referral if you are having foot or hand numbness or difficulties with grooming,   dressing, eating, or physical activity. SEEK MEDICAL CARE IF:   You are unable to eat food or drink fluids for more than 6 hours.  You have nausea and vomiting for more than 6 hours.  Your blood glucose level is over 240 mg/dL.  There is a change in mental status.  You develop an additional serious illness.  You have diarrhea for more than 6 hours.  You have been sick or have had a fever for a couple of days and are not getting better.  You have pain during any physical activity.  SEEK IMMEDIATE MEDICAL CARE IF:  You have difficulty breathing.  You have moderate to large ketone levels. MAKE SURE YOU:  Understand these instructions.  Will watch your condition.  Will get help right away if  you are not doing well or get worse. Document Released: 08/14/2005 Document Revised: 05/08/2012 Document Reviewed: 03/12/2012 [employer] Patient Information 2014 [employer].  

## 2013-09-02 NOTE — Progress Notes (Signed)
Pre visit review using our clinic review tool, if applicable. No additional management support is needed unless otherwise documented below in the visit note. 

## 2013-09-02 NOTE — Progress Notes (Signed)
   Subjective:    Patient ID: Brandon Shannon, male    DOB: [birth year], 58 y.o.   MRN: [id number]  Hypertension This is a chronic problem. The current episode started more than 1 year ago. The problem has been gradually improving since onset. The problem is controlled. Pertinent negatives include no anxiety, blurred vision, chest pain, headaches, malaise/fatigue, neck pain, orthopnea, palpitations, peripheral edema, PND, shortness of breath or sweats. Past treatments include diuretics and angiotensin blockers. The current treatment provides moderate improvement. Compliance problems include exercise and diet.       Review of Systems  Constitutional: Negative.  Negative for fever, chills, malaise/fatigue, diaphoresis, appetite change and fatigue.  HENT: Negative.   Eyes: Negative.  Negative for blurred vision.  Respiratory: Negative.  Negative for cough, choking, chest tightness, shortness of breath and stridor.   Cardiovascular: Negative.  Negative for chest pain, palpitations, orthopnea, leg swelling and PND.  Gastrointestinal: Negative.  Negative for nausea, vomiting, abdominal pain, diarrhea, constipation and blood in stool.  Endocrine: Negative.  Negative for polydipsia, polyphagia and polyuria.  Genitourinary: Negative.   Musculoskeletal: Negative.  Negative for arthralgias, back pain, myalgias and neck pain.  Skin: Negative.   Allergic/Immunologic: Negative.   Neurological: Negative.  Negative for dizziness, syncope, speech difficulty, weakness, light-headedness and headaches.  Hematological: Negative.  Negative for adenopathy. Does not bruise/bleed easily.  Psychiatric/Behavioral: Negative.        Objective:   Physical Exam  Vitals reviewed. Constitutional: He is oriented to person, place, and time. He appears well-developed and well-nourished. No distress.  HENT:  Head: Normocephalic and atraumatic.  Mouth/Throat: Oropharynx is clear and moist. No oropharyngeal exudate.  Eyes:  Conjunctivae are normal. Right eye exhibits no discharge. Left eye exhibits no discharge. No scleral icterus.  Neck: Normal range of motion. Neck supple. No JVD present. No tracheal deviation present. No thyromegaly present.  Cardiovascular: Normal rate, regular rhythm, normal heart sounds and intact distal pulses.  Exam reveals no gallop and no friction rub.   No murmur heard. Pulmonary/Chest: Breath sounds normal. No stridor. No respiratory distress. He has no wheezes. He has no rales. He exhibits no tenderness.  Abdominal: Soft. Bowel sounds are normal. He exhibits no distension and no mass. There is no tenderness. There is no rebound and no guarding.  Musculoskeletal: Normal range of motion. He exhibits no edema and no tenderness.  Lymphadenopathy:    He has no cervical adenopathy.  Neurological: He is oriented to person, place, and time.  Skin: Skin is warm and dry. No rash noted. He is not diaphoretic. No erythema. No pallor.  Psychiatric: He has a normal mood and affect. His behavior is normal. Judgment and thought content normal.     Lab Results  Component Value Date   WBC 9.5 04/02/2013   HGB 15.3 04/02/2013   HCT 45.6 04/02/2013   PLT 199.0 04/02/2013   GLUCOSE 84 04/02/2013   CHOL 142 03/13/2007   TRIG 78 03/13/2013   HDL 62 03/13/2013   LDLCALC 141 03/13/2013   ALT 28 04/02/2013   AST 25 04/02/2013   NA 139 04/02/2013   K 3.7 04/02/2013   CL 104 04/02/2013   CREATININE 1.2 04/02/2013   BUN 18 04/02/2013   CO2 27 04/02/2013   TSH 0.54 03/13/2007   PSA 0.594 03/13/2013   HGBA1C 6.5* 03/13/2013       Assessment & Plan:

## 2013-09-03 ENCOUNTER — Encounter: Payer: Self-pay | Admitting: Internal Medicine

## 2013-09-03 LAB — VITAMIN D 25 HYDROXY (VIT D DEFICIENCY, FRACTURES): VIT D 25 HYDROXY: 47 ng/mL (ref 30–89)

## 2013-09-04 ENCOUNTER — Encounter: Payer: Self-pay | Admitting: Internal Medicine

## 2013-09-04 NOTE — Assessment & Plan Note (Signed)
His BP is well controlled 

## 2013-09-04 NOTE — Assessment & Plan Note (Signed)
Improvement noted 

## 2013-09-04 NOTE — Assessment & Plan Note (Signed)
His A1C has increased some but he does not need a medication yet He will improve his lifestyle modifications

## 2014-02-11 ENCOUNTER — Ambulatory Visit (INDEPENDENT_AMBULATORY_CARE_PROVIDER_SITE_OTHER): Admitting: Internal Medicine

## 2014-02-11 ENCOUNTER — Encounter: Payer: Self-pay | Admitting: Internal Medicine

## 2014-02-11 ENCOUNTER — Other Ambulatory Visit (INDEPENDENT_AMBULATORY_CARE_PROVIDER_SITE_OTHER)

## 2014-02-11 VITALS — BP 110/80 | HR 74 | Temp 97.8°F | Resp 16 | Ht 70.0 in | Wt 243.0 lb

## 2014-02-11 DIAGNOSIS — K5289 Other specified noninfective gastroenteritis and colitis: Secondary | ICD-10-CM

## 2014-02-11 DIAGNOSIS — R0989 Other specified symptoms and signs involving the circulatory and respiratory systems: Secondary | ICD-10-CM

## 2014-02-11 DIAGNOSIS — R0600 Dyspnea, unspecified: Secondary | ICD-10-CM | POA: Insufficient documentation

## 2014-02-11 DIAGNOSIS — IMO0001 Reserved for inherently not codable concepts without codable children: Secondary | ICD-10-CM

## 2014-02-11 DIAGNOSIS — E1165 Type 2 diabetes mellitus with hyperglycemia: Principal | ICD-10-CM

## 2014-02-11 DIAGNOSIS — R0609 Other forms of dyspnea: Secondary | ICD-10-CM | POA: Insufficient documentation

## 2014-02-11 DIAGNOSIS — K529 Noninfective gastroenteritis and colitis, unspecified: Secondary | ICD-10-CM

## 2014-02-11 DIAGNOSIS — I10 Essential (primary) hypertension: Secondary | ICD-10-CM

## 2014-02-11 DIAGNOSIS — R9431 Abnormal electrocardiogram [ECG] [EKG]: Secondary | ICD-10-CM

## 2014-02-11 LAB — CBC WITH DIFFERENTIAL/PLATELET
Basophils Absolute: 0 10*3/uL (ref 0.0–0.1)
Basophils Relative: 0.5 % (ref 0.0–3.0)
Eosinophils Absolute: 0.3 10*3/uL (ref 0.0–0.7)
Eosinophils Relative: 2.4 % (ref 0.0–5.0)
HCT: 47.7 % (ref 39.0–52.0)
Hemoglobin: 16.1 g/dL (ref 13.0–17.0)
Lymphocytes Relative: 33.1 % (ref 12.0–46.0)
Lymphs Abs: 3.5 10*3/uL (ref 0.7–4.0)
MCHC: 33.7 g/dL (ref 30.0–36.0)
MCV: 91.5 fl (ref 78.0–100.0)
Monocytes Absolute: 0.7 10*3/uL (ref 0.1–1.0)
Monocytes Relative: 6.3 % (ref 3.0–12.0)
NEUTROS PCT: 57.7 % (ref 43.0–77.0)
Neutro Abs: 6.1 10*3/uL (ref 1.4–7.7)
PLATELETS: 131 10*3/uL — AB (ref 150.0–400.0)
RBC: 5.22 Mil/uL (ref 4.22–5.81)
RDW: 13.9 % (ref 11.5–15.5)
WBC: 10.6 10*3/uL — ABNORMAL HIGH (ref 4.0–10.5)

## 2014-02-11 LAB — COMPREHENSIVE METABOLIC PANEL
ALT: 31 U/L (ref 0–53)
AST: 25 U/L (ref 0–37)
Albumin: 4.3 g/dL (ref 3.5–5.2)
Alkaline Phosphatase: 102 U/L (ref 39–117)
BILIRUBIN TOTAL: 0.8 mg/dL (ref 0.2–1.2)
BUN: 12 mg/dL (ref 6–23)
CALCIUM: 9.6 mg/dL (ref 8.4–10.5)
CO2: 28 mEq/L (ref 19–32)
CREATININE: 1.1 mg/dL (ref 0.4–1.5)
Chloride: 105 mEq/L (ref 96–112)
GFR: 87.82 mL/min (ref >60.00)
Glucose, Bld: 132 mg/dL — ABNORMAL HIGH (ref 70–99)
Potassium: 3.7 mEq/L (ref 3.5–5.1)
Sodium: 141 mEq/L (ref 135–145)
Total Protein: 8 g/dL (ref 6.0–8.3)

## 2014-02-11 LAB — BRAIN NATRIURETIC PEPTIDE: Pro B Natriuretic peptide (BNP): 18 pg/mL (ref 0.0–100.0)

## 2014-02-11 LAB — CARDIAC PANEL
CK MB: 1.9 ng/mL (ref 0.3–4.0)
CK TOTAL: 98 U/L (ref 7–232)
RELATIVE INDEX: 1.9 calc (ref 0.0–2.5)

## 2014-02-11 LAB — HEMOGLOBIN A1C: Hgb A1c MFr Bld: 7.1 % — ABNORMAL HIGH (ref 4.6–6.5)

## 2014-02-11 NOTE — Assessment & Plan Note (Signed)
This appears to be viral He is getting better and dose not require any treatment at this time

## 2014-02-11 NOTE — Progress Notes (Signed)
Subjective:    Patient ID: Brandon Shannon, male    DOB: 06-28-1956, 58 y.o.   MRN: [id number]  HPI Comments: He returns for f/up and he tells me that he has had nausea, chills, myalgias, and poor appetite for 4 days after eating at a buffet. He denies vomiting, pain, or diarrhea.  Hypertension This is a chronic problem. The current episode started more than 1 year ago. The problem is unchanged. The problem is controlled. Associated symptoms include malaise/fatigue and shortness of breath (he had a brief episode of DOE a few  weeks ago). Pertinent negatives include no anxiety, blurred vision, chest pain, headaches, neck pain, orthopnea, palpitations, peripheral edema, PND or sweats. There are no associated agents to hypertension. Past treatments include diuretics, angiotensin blockers and alpha 1 blockers. The current treatment provides moderate improvement. Compliance problems include medication side effects, exercise and diet (he complains of feeling lightheaded).       Review of Systems  Constitutional: Positive for chills, malaise/fatigue, appetite change and fatigue. Negative for fever, diaphoresis, activity change and unexpected weight change.  HENT: Negative.   Eyes: Negative.  Negative for blurred vision.  Respiratory: Positive for shortness of breath (he had a brief episode of DOE a few  weeks ago). Negative for apnea, cough, choking, chest tightness, wheezing and stridor.   Cardiovascular: Negative.  Negative for chest pain, palpitations, orthopnea, leg swelling and PND.  Gastrointestinal: Negative.  Negative for nausea, vomiting, abdominal pain, diarrhea, constipation and blood in stool.  Endocrine: Negative.   Genitourinary: Negative.   Musculoskeletal: Negative.  Negative for arthralgias, myalgias and neck pain.  Skin: Negative.  Negative for rash.  Allergic/Immunologic: Negative.   Neurological: Positive for light-headedness. Negative for dizziness, tremors, seizures, syncope,  speech difficulty, weakness, numbness and headaches.  Hematological: Negative.  Negative for adenopathy. Does not bruise/bleed easily.  Psychiatric/Behavioral: Negative.        Objective:   Physical Exam  Vitals reviewed. Constitutional: He is oriented to person, place, and time. He appears well-developed and well-nourished. No distress.  HENT:  Head: Normocephalic and atraumatic.  Mouth/Throat: Oropharynx is clear and moist. No oropharyngeal exudate.  Eyes: Conjunctivae are normal. Right eye exhibits no discharge. Left eye exhibits no discharge. No scleral icterus.  Neck: Normal range of motion. Neck supple. No JVD present. No tracheal deviation present. No thyromegaly present.  Cardiovascular: Normal rate, regular rhythm, normal heart sounds and intact distal pulses.  Exam reveals no gallop and no friction rub.   No murmur heard. Pulmonary/Chest: Effort normal and breath sounds normal. No stridor. No respiratory distress. He has no wheezes. He has no rales. He exhibits no tenderness.  Abdominal: Soft. Bowel sounds are normal. He exhibits no distension and no mass. There is no tenderness. There is no rebound and no guarding.  Musculoskeletal: Normal range of motion. He exhibits no edema and no tenderness.  Lymphadenopathy:    He has no cervical adenopathy.  Neurological: He is oriented to person, place, and time.  Skin: Skin is warm and dry. No rash noted. He is not diaphoretic. No erythema. No pallor.  Psychiatric: He has a normal mood and affect. His behavior is normal. Judgment and thought content normal.     Lab Results  Component Value Date   WBC 9.5 04/02/2013   HGB 15.3 04/02/2013   HCT 45.6 04/02/2013   PLT 199.0 04/02/2013   GLUCOSE 130* 09/02/2013   CHOL 133 09/02/2013   TRIG 54.0 09/02/2013   HDL 52.00 09/02/2013  LDLCALC 70 09/02/2013   ALT 28 04/02/2013   AST 25 04/02/2013   NA 137 09/02/2013   K 4.1 09/02/2013   CL 103 09/02/2013   CREATININE 1.2 09/02/2013   BUN 14 09/02/2013   CO2 29  09/02/2013   TSH 0.54 03/13/2007   PSA 0.594 03/13/2013   HGBA1C 6.7* 09/02/2013      Assessment & Plan:

## 2014-02-11 NOTE — Assessment & Plan Note (Signed)
The EKG shows early repol, but with his symptoms I will get a lexiscan done to see if there is CAD

## 2014-02-11 NOTE — Assessment & Plan Note (Signed)
His EKG is abnormal but it appears that her may have early repol, none the less he has s/s that may be ischemia and he has rick factors so I have asked him to have a lexiscan done.

## 2014-02-11 NOTE — Patient Instructions (Signed)

## 2014-02-11 NOTE — Assessment & Plan Note (Signed)
His blood sugars have gone up some so I have asked him to improve on his lifestyle modifications

## 2014-02-11 NOTE — Assessment & Plan Note (Signed)
His BP is well controlled and he reports episodes of light headedness so I have asked him to stop the HCTZ for now His lytes and renal function are stable

## 2014-02-12 ENCOUNTER — Telehealth: Payer: Self-pay | Admitting: Internal Medicine

## 2014-02-12 NOTE — Telephone Encounter (Signed)
Relevant patient education assigned to patient using Emmi. ° °

## 2014-02-19 ENCOUNTER — Ambulatory Visit (HOSPITAL_COMMUNITY): Attending: Internal Medicine | Admitting: Radiology

## 2014-02-19 VITALS — BP 138/94 | Ht 70.0 in | Wt 242.0 lb

## 2014-02-19 DIAGNOSIS — R9431 Abnormal electrocardiogram [ECG] [EKG]: Secondary | ICD-10-CM

## 2014-02-19 DIAGNOSIS — R0602 Shortness of breath: Secondary | ICD-10-CM

## 2014-02-19 DIAGNOSIS — R0989 Other specified symptoms and signs involving the circulatory and respiratory systems: Secondary | ICD-10-CM | POA: Insufficient documentation

## 2014-02-19 DIAGNOSIS — R42 Dizziness and giddiness: Secondary | ICD-10-CM | POA: Insufficient documentation

## 2014-02-19 DIAGNOSIS — R0600 Dyspnea, unspecified: Secondary | ICD-10-CM

## 2014-02-19 DIAGNOSIS — R0609 Other forms of dyspnea: Secondary | ICD-10-CM | POA: Insufficient documentation

## 2014-02-19 MED ORDER — TECHNETIUM TC 99M SESTAMIBI GENERIC - CARDIOLITE
11.0000 | Freq: Once | INTRAVENOUS | Status: AC | PRN
  Administered 2014-02-19: 11 via INTRAVENOUS

## 2014-02-19 MED ORDER — TECHNETIUM TC 99M SESTAMIBI GENERIC - CARDIOLITE
33.0000 | Freq: Once | INTRAVENOUS | Status: AC | PRN
  Administered 2014-02-19: 33 via INTRAVENOUS

## 2014-02-19 MED ORDER — REGADENOSON 0.4 MG/5ML IV SOLN
0.4000 mg | Freq: Once | INTRAVENOUS | Status: AC
  Administered 2014-02-19: 0.4 mg via INTRAVENOUS

## 2014-02-19 NOTE — Progress Notes (Signed)
[facility] [address] [place], [place] [address] [phone]    Cardiology Nuclear Med Study  Brandon Shannon is a 58 y.o. male     MRN : [id number]     DOB: July 09, 1956  Procedure Date: 02/19/2014  Nuclear Med Background Indication for Stress Test:  Evaluation for Ischemia and Abnormal EKG History:No Known History of CAD;Echo 03/25/07 EF:55-65%;Previous Nuclear Study 02/2007 EF:68 lower anterior uptake , no ischemia  Cardiac Risk Factors: Family History - CAD, Hypertension, Lipids and NIDDM  Symptoms:  Dizziness and DOE   Nuclear Pre-Procedure Caffeine/Decaff Intake:  None> 12 hrs NPO After: 7:00pm   Lungs:  clear O2 Sat: 96% on room air. IV 0.9% NS with Angio Cath:  22g  IV Site: R Forearm x 1, tolerated well IV Started by:  Irven Baltimore, RN  Chest Size (in):  44 Cup Size: n/a  Height: 5\' 10"  (1.778 m)  Weight:  242 lb (109.77 kg)  BMI:  Body mass index is 34.72 kg/(m^2). Tech Comments:  Patient took Cozaar, and Xanax  this am per patient.Irven Baltimore, RN.    Nuclear Med Study 1 or 2 day study: 1 day  Stress Test Type:  Treadmill/Lexiscan  Reading MD: N/A  Order Authorizing Provider:  Scarlette Calico, MD  Resting Radionuclide: Technetium 56m Sestamibi  Resting Radionuclide Dose: 11.0 mCi   Stress Radionuclide:  Technetium 44m Sestamibi  Stress Radionuclide Dose: 33.0 mCi           Stress Protocol Rest HR: 64 Stress HR: 107  Rest BP: 138/94 Stress BP: 121/60  Exercise Time (min): 2:00 METS: n/a   Predicted Max HR: 160 bpm % Max HR: 66.88 bpm Rate Pressure Product: 14766   Dose of Adenosine (mg):  n/a Dose of Lexiscan: 0.4 mg  Dose of Atropine (mg): n/a Dose of Dobutamine: n/a mcg/kg/min (at max HR)  Stress Test Technologist: Ileene Hutchinson, EMT-P  Nuclear Technologist:  Charlton Amor, CNMT     Rest Procedure:  Myocardial perfusion imaging was performed at rest 45 minutes following the intravenous administration of Technetium 83m  Sestamibi. Rest ECG: NSR - Normal EKG  Stress Procedure:  The patient received IV Lexiscan 0.4 mg over 15-seconds with concurrent low level exercise and then Technetium 59m Sestamibi was injected at 30-seconds while the patient continued walking one more minute.  Quantitative spect images were obtained after a 45-minute delay. Stress ECG: No significant change from baseline ECG  QPS Raw Data Images:  Mild diaphragmatic attenuation.  Normal left ventricular size. Stress Images:  Small area of decreased uptake in the apical anterior wall and basal and mid inferior wall Rest Images:  small area of decreased uptake in the apical anterior wall and entire inferior wall Subtraction (SDS):  There is a fixed defect in the inferior wall, worse on rest images, that is most consistent with diaphragmatic attenuation. There is a small fixed defect in the apical anterior wall. Transient Ischemic Dilatation (Normal <1.22):  1.02 Lung/Heart Ratio (Normal <0.45):  0.24  Quantitative Gated Spect Images QGS EDV:  120 ml QGS ESV:  44 ml  Impression Exercise Capacity:  Lexiscan with low level exercise. BP Response:  Normal blood pressure response. Clinical Symptoms:  lightheaded ECG Impression:  No significant ST segment change suggestive of ischemia. Comparison with Prior Nuclear Study: No images to compare  Overall Impression:  Low risk stress nuclear study There is a fixed defect in the apical anterior wall with no ischemia.  There  is a fixed defect in the inferior wall that is worse on rest images and is consistent with diaphragmatic attenuation.,.  LV Ejection Fraction: 63%.  LV Wall Motion:  NL LV Function; NL Wall Motion  Signed: Fransico Him, MD [facility]

## 2014-02-20 ENCOUNTER — Encounter: Payer: Self-pay | Admitting: Internal Medicine

## 2014-02-22 ENCOUNTER — Encounter: Payer: Self-pay | Admitting: Internal Medicine

## 2014-03-03 ENCOUNTER — Ambulatory Visit: Admitting: Internal Medicine

## 2014-03-04 ENCOUNTER — Encounter: Payer: Self-pay | Admitting: Internal Medicine

## 2014-03-04 ENCOUNTER — Ambulatory Visit (INDEPENDENT_AMBULATORY_CARE_PROVIDER_SITE_OTHER): Admitting: Internal Medicine

## 2014-03-04 VITALS — BP 126/80 | HR 80 | Temp 98.0°F | Resp 16 | Ht 70.0 in | Wt 246.0 lb

## 2014-03-04 DIAGNOSIS — R0989 Other specified symptoms and signs involving the circulatory and respiratory systems: Secondary | ICD-10-CM

## 2014-03-04 DIAGNOSIS — F411 Generalized anxiety disorder: Secondary | ICD-10-CM | POA: Insufficient documentation

## 2014-03-04 DIAGNOSIS — R0609 Other forms of dyspnea: Secondary | ICD-10-CM

## 2014-03-04 DIAGNOSIS — R0600 Dyspnea, unspecified: Secondary | ICD-10-CM

## 2014-03-04 DIAGNOSIS — E1165 Type 2 diabetes mellitus with hyperglycemia: Secondary | ICD-10-CM

## 2014-03-04 DIAGNOSIS — I10 Essential (primary) hypertension: Secondary | ICD-10-CM

## 2014-03-04 DIAGNOSIS — IMO0001 Reserved for inherently not codable concepts without codable children: Secondary | ICD-10-CM

## 2014-03-04 MED ORDER — SERTRALINE HCL 50 MG PO TABS: 50.0000 mg | ORAL_TABLET | Freq: Every day | ORAL | Status: DC

## 2014-03-04 NOTE — Assessment & Plan Note (Signed)
He does not want to start a medication He will work on his lifestyle modifications and will see nutrition/diabetic education

## 2014-03-04 NOTE — Progress Notes (Signed)
Pre visit review using our clinic review tool, if applicable. No additional management support is needed unless otherwise documented below in the visit note. 

## 2014-03-04 NOTE — Patient Instructions (Signed)

## 2014-03-04 NOTE — Assessment & Plan Note (Signed)
I have asked him to start an SSRI

## 2014-03-04 NOTE — Progress Notes (Signed)
Subjective:    Patient ID: Brandon Shannon, male    DOB: 06-08-1956, 58 y.o.   MRN: [id number]  Diabetes He presents for his follow-up diabetic visit. He has type 2 diabetes mellitus. His disease course has been stable. Hypoglycemia symptoms include nervousness/anxiousness. Pertinent negatives for hypoglycemia include no confusion, dizziness, headaches or tremors. Pertinent negatives for diabetes include no blurred vision, no chest pain, no fatigue, no foot paresthesias, no foot ulcerations, no polydipsia, no polyphagia, no polyuria, no visual change, no weakness and no weight loss. There are no hypoglycemic complications. Symptoms are stable. There are no diabetic complications. Current diabetic treatment includes diet. He is compliant with treatment most of the time. He is following a generally healthy diet. Meal planning includes avoidance of concentrated sweets. He participates in exercise intermittently. There is no change in his home blood glucose trend. An ACE inhibitor/angiotensin II receptor blocker is being taken. He does not see a podiatrist.Eye exam is not current.      Review of Systems  Constitutional: Negative.  Negative for fever, chills, weight loss, diaphoresis, appetite change and fatigue.  HENT: Negative.   Eyes: Negative.  Negative for blurred vision.  Respiratory: Negative.  Negative for cough, choking, chest tightness, shortness of breath and stridor.   Cardiovascular: Negative.  Negative for chest pain, palpitations and leg swelling.  Gastrointestinal: Negative.  Negative for vomiting, abdominal pain, diarrhea, constipation and blood in stool.  Endocrine: Negative.  Negative for polydipsia, polyphagia and polyuria.  Genitourinary: Negative.   Musculoskeletal: Negative.  Negative for arthralgias, back pain, myalgias and neck pain.  Skin: Negative.   Allergic/Immunologic: Negative.   Neurological: Negative.  Negative for dizziness, tremors, syncope, weakness,  light-headedness, numbness and headaches.  Hematological: Negative.  Negative for adenopathy. Does not bruise/bleed easily.  Psychiatric/Behavioral: Positive for sleep disturbance, dysphoric mood and decreased concentration. Negative for suicidal ideas, hallucinations, behavioral problems, confusion, self-injury and agitation. The patient is nervous/anxious. The patient is not hyperactive.        Objective:   Physical Exam  Vitals reviewed. Constitutional: He is oriented to person, place, and time. He appears well-developed and well-nourished. No distress.  HENT:  Head: Normocephalic and atraumatic.  Mouth/Throat: Oropharynx is clear and moist. No oropharyngeal exudate.  Eyes: Conjunctivae are normal. Right eye exhibits no discharge. Left eye exhibits no discharge. No scleral icterus.  Neck: Normal range of motion. Neck supple. No JVD present. No tracheal deviation present. No thyromegaly present.  Cardiovascular: Normal rate, regular rhythm, normal heart sounds and intact distal pulses.  Exam reveals no gallop and no friction rub.   No murmur heard. Pulmonary/Chest: Effort normal and breath sounds normal. No stridor. No respiratory distress. He has no wheezes. He has no rales. He exhibits no tenderness.  Abdominal: Soft. Bowel sounds are normal. He exhibits no distension and no mass. There is no tenderness. There is no rebound and no guarding.  Musculoskeletal: Normal range of motion. He exhibits no edema and no tenderness.  Lymphadenopathy:    He has no cervical adenopathy.  Neurological: He is oriented to person, place, and time.  Skin: Skin is warm and dry. No rash noted. He is not diaphoretic. No erythema. No pallor.  Psychiatric: His speech is normal and behavior is normal. Judgment and thought content normal. His mood appears anxious. His affect is not angry, not blunt, not labile and not inappropriate. Cognition and memory are normal. He exhibits a depressed mood. He expresses no  homicidal and no suicidal ideation. He expresses  no suicidal plans and no homicidal plans.          Assessment & Plan:

## 2014-03-04 NOTE — Assessment & Plan Note (Signed)
His BP is well controlled 

## 2014-03-04 NOTE — Assessment & Plan Note (Signed)
This appears to be related to conditioning, weight

## 2014-03-10 ENCOUNTER — Other Ambulatory Visit (INDEPENDENT_AMBULATORY_CARE_PROVIDER_SITE_OTHER)

## 2014-03-10 ENCOUNTER — Encounter: Payer: Self-pay | Admitting: Internal Medicine

## 2014-03-10 ENCOUNTER — Ambulatory Visit (INDEPENDENT_AMBULATORY_CARE_PROVIDER_SITE_OTHER)
Admission: RE | Admit: 2014-03-10 | Discharge: 2014-03-10 | Disposition: A | Discharge status: Home / Self Care | Source: Ambulatory Visit | Attending: Internal Medicine | Admitting: Internal Medicine

## 2014-03-10 ENCOUNTER — Ambulatory Visit (INDEPENDENT_AMBULATORY_CARE_PROVIDER_SITE_OTHER): Admitting: Internal Medicine

## 2014-03-10 VITALS — BP 130/78 | HR 72 | Temp 102.8°F | Resp 16 | Wt 244.0 lb

## 2014-03-10 DIAGNOSIS — R509 Fever, unspecified: Secondary | ICD-10-CM | POA: Insufficient documentation

## 2014-03-10 DIAGNOSIS — R059 Cough, unspecified: Secondary | ICD-10-CM

## 2014-03-10 DIAGNOSIS — R05 Cough: Secondary | ICD-10-CM

## 2014-03-10 DIAGNOSIS — R259 Unspecified abnormal involuntary movements: Secondary | ICD-10-CM

## 2014-03-10 DIAGNOSIS — G252 Other specified forms of tremor: Secondary | ICD-10-CM

## 2014-03-10 LAB — CBC WITH DIFFERENTIAL/PLATELET
BASOS PCT: 0.3 % (ref 0.0–3.0)
Basophils Absolute: 0 10*3/uL (ref 0.0–0.1)
Eosinophils Absolute: 0 10*3/uL (ref 0.0–0.7)
Eosinophils Relative: 0 % (ref 0.0–5.0)
HCT: 42.1 % (ref 39.0–52.0)
Hemoglobin: 13.8 g/dL (ref 13.0–17.0)
LYMPHS ABS: 1.4 10*3/uL (ref 0.7–4.0)
Lymphocytes Relative: 13.3 % (ref 12.0–46.0)
MCHC: 32.8 g/dL (ref 30.0–36.0)
MCV: 91.7 fl (ref 78.0–100.0)
MONO ABS: 1.5 10*3/uL — AB (ref 0.1–1.0)
MONOS PCT: 14 % — AB (ref 3.0–12.0)
Neutro Abs: 7.7 10*3/uL (ref 1.4–7.7)
Neutrophils Relative %: 72.4 % (ref 43.0–77.0)
Platelets: 194 10*3/uL (ref 150.0–400.0)
RBC: 4.59 Mil/uL (ref 4.22–5.81)
RDW: 14.4 % (ref 11.5–15.5)
WBC: 10.6 10*3/uL — AB (ref 4.0–10.5)

## 2014-03-10 LAB — URINALYSIS, ROUTINE W REFLEX MICROSCOPIC
BILIRUBIN URINE: NEGATIVE
Ketones, ur: NEGATIVE
Leukocytes, UA: NEGATIVE
Nitrite: NEGATIVE
Specific Gravity, Urine: 1.02 (ref 1.000–1.030)
TOTAL PROTEIN, URINE-UPE24: 100 — AB
URINE GLUCOSE: NEGATIVE
UROBILINOGEN UA: 0.2 (ref 0.0–1.0)
pH: 6 (ref 5.0–8.0)

## 2014-03-10 LAB — BASIC METABOLIC PANEL
BUN: 17 mg/dL (ref 6–23)
CALCIUM: 8.6 mg/dL (ref 8.4–10.5)
CO2: 25 mEq/L (ref 19–32)
Chloride: 91 mEq/L — ABNORMAL LOW (ref 96–112)
Creatinine, Ser: 1.2 mg/dL (ref 0.4–1.5)
GFR: 77.91 mL/min (ref >60.00)
Glucose, Bld: 157 mg/dL — ABNORMAL HIGH (ref 70–99)
POTASSIUM: 4.3 meq/L (ref 3.5–5.1)
Sodium: 125 mEq/L — ABNORMAL LOW (ref 135–145)

## 2014-03-10 LAB — HEPATIC FUNCTION PANEL
ALBUMIN: 3.1 g/dL — AB (ref 3.5–5.2)
ALK PHOS: 97 U/L (ref 39–117)
ALT: 47 U/L (ref 0–53)
AST: 59 U/L — AB (ref 0–37)
Bilirubin, Direct: 0.2 mg/dL (ref 0.0–0.3)
Total Bilirubin: 0.8 mg/dL (ref 0.2–1.2)
Total Protein: 7.3 g/dL (ref 6.0–8.3)

## 2014-03-10 LAB — SEDIMENTATION RATE: Sed Rate: 63 mm/hr — ABNORMAL HIGH (ref 0–22)

## 2014-03-10 MED ORDER — BENZONATATE 200 MG PO CAPS: 200.0000 mg | ORAL_CAPSULE | Freq: Two times a day (BID) | ORAL | Status: DC | PRN

## 2014-03-10 MED ORDER — ALPRAZOLAM 0.25 MG PO TABS: 0.2500 mg | ORAL_TABLET | Freq: Two times a day (BID) | ORAL | Status: DC | PRN

## 2014-03-10 MED ORDER — LEVOFLOXACIN 500 MG PO TABS: 500.0000 mg | ORAL_TABLET | Freq: Every day | ORAL | Status: DC

## 2014-03-10 MED ORDER — HYDROCOD POLST-CHLORPHEN POLST 10-8 MG/5ML PO LQCR: 5.0000 mL | Freq: Two times a day (BID) | ORAL | Status: DC | PRN

## 2014-03-10 NOTE — Assessment & Plan Note (Signed)
CXR Labs Levaquin To ER if worse Dr Ronnald Ramp in 2 d

## 2014-03-10 NOTE — Assessment & Plan Note (Signed)
R/o PNA Tessalon prn

## 2014-03-10 NOTE — Patient Instructions (Signed)
Stop Zoloft Go to ER if worse

## 2014-03-10 NOTE — Progress Notes (Signed)
Pre visit review using our clinic review tool, if applicable. No additional management support is needed unless otherwise documented below in the visit note. 

## 2014-03-10 NOTE — Progress Notes (Signed)
   Subjective:    Patient ID: Brandon Shannon, male    DOB: [birth year], 58 y.o.   MRN: [id number]  Cough This is a new problem. The current episode started 1 to 4 weeks ago. The problem has been gradually worsening. The problem occurs constantly. The cough is non-productive. Associated symptoms include chest pain, a fever, headaches and a sore throat. Pertinent negatives include no rhinorrhea or wheezing. The treatment provided no relief. There is no history of emphysema.      Review of Systems  Constitutional: Positive for fever and fatigue.  HENT: Positive for congestion and sore throat. Negative for rhinorrhea.   Respiratory: Positive for cough. Negative for wheezing.   Cardiovascular: Positive for chest pain. Negative for palpitations.  Genitourinary: Negative for urgency and flank pain.  Neurological: Positive for weakness and headaches.  Psychiatric/Behavioral: The patient is not nervous/anxious.        Objective:   Physical Exam  Constitutional: He is oriented to person, place, and time. He appears well-developed. No distress.  Obese Looks tired  HENT:  Mouth/Throat: No oropharyngeal exudate.  eryth throat  Eyes: Conjunctivae are normal. Pupils are equal, round, and reactive to light. Right eye exhibits no discharge.  Neck: Normal range of motion. No JVD present. No thyromegaly present.  Cardiovascular: Normal rate, regular rhythm, normal heart sounds and intact distal pulses.  Exam reveals no gallop and no friction rub.   No murmur heard. Pulmonary/Chest: Effort normal and breath sounds normal. No respiratory distress. He has no wheezes. He has no rales. He exhibits no tenderness.  No rales  Abdominal: Soft. Bowel sounds are normal. He exhibits no distension and no mass. There is no tenderness. There is no rebound and no guarding.  Musculoskeletal: Normal range of motion. He exhibits no edema and no tenderness.  Lymphadenopathy:    He has no cervical adenopathy.    Neurological: He is alert and oriented to person, place, and time. He has normal reflexes. No cranial nerve deficit. He exhibits normal muscle tone. He displays a negative Romberg sign. Coordination abnormal. Gait normal.  No meningeal signs Tremor  Skin: Skin is warm. No rash noted. He is diaphoretic. No erythema.  Psychiatric: He has a normal mood and affect. His behavior is normal. Judgment and thought content normal.          Assessment & Plan:

## 2014-03-10 NOTE — Assessment & Plan Note (Signed)
7/15 ?etiology -?due to [place]

## 2014-03-11 ENCOUNTER — Telehealth: Payer: Self-pay | Admitting: Internal Medicine

## 2014-03-11 NOTE — Telephone Encounter (Signed)
Pt informed

## 2014-03-11 NOTE — Telephone Encounter (Signed)
Brandon Shannon, please, inform patient that he has a pneumonia on the left and some abnormal labs. If not better - go to ER.  Thx  PS: I send him results via MyChart

## 2014-03-12 ENCOUNTER — Encounter: Payer: Self-pay | Admitting: Internal Medicine

## 2014-03-12 ENCOUNTER — Ambulatory Visit (INDEPENDENT_AMBULATORY_CARE_PROVIDER_SITE_OTHER): Admitting: Internal Medicine

## 2014-03-12 ENCOUNTER — Telehealth: Payer: Self-pay | Admitting: Internal Medicine

## 2014-03-12 VITALS — BP 92/60 | HR 72 | Resp 16 | Wt 241.0 lb

## 2014-03-12 DIAGNOSIS — G252 Other specified forms of tremor: Secondary | ICD-10-CM

## 2014-03-12 DIAGNOSIS — R0600 Dyspnea, unspecified: Secondary | ICD-10-CM

## 2014-03-12 DIAGNOSIS — R0989 Other specified symptoms and signs involving the circulatory and respiratory systems: Secondary | ICD-10-CM

## 2014-03-12 DIAGNOSIS — J189 Pneumonia, unspecified organism: Secondary | ICD-10-CM | POA: Insufficient documentation

## 2014-03-12 DIAGNOSIS — E871 Hypo-osmolality and hyponatremia: Secondary | ICD-10-CM | POA: Insufficient documentation

## 2014-03-12 DIAGNOSIS — R0609 Other forms of dyspnea: Secondary | ICD-10-CM

## 2014-03-12 DIAGNOSIS — R259 Unspecified abnormal involuntary movements: Secondary | ICD-10-CM

## 2014-03-12 DIAGNOSIS — R3129 Other microscopic hematuria: Secondary | ICD-10-CM | POA: Insufficient documentation

## 2014-03-12 NOTE — Progress Notes (Signed)
Pre visit review using our clinic review tool, if applicable. No additional management support is needed unless otherwise documented below in the visit note. 

## 2014-03-12 NOTE — Progress Notes (Signed)
   Subjective:    Patient ID: Brandon Shannon, male    DOB: 19-Mar-1956, 58 y.o.   MRN: [id number]  Cough This is a new problem. The current episode started 1 to 4 weeks ago. The problem has been gradually improving. The problem occurs constantly. The cough is non-productive. Pertinent negatives include no chest pain, fever, headaches, rhinorrhea, sore throat or wheezing. He has tried prescription cough suppressant for the symptoms. The treatment provided significant relief. There is no history of emphysema.   F/u PNA, low sodium, tremor, abn labs  Feeling better   Review of Systems  Constitutional: Positive for fatigue. Negative for fever.  HENT: Positive for congestion. Negative for rhinorrhea and sore throat.   Respiratory: Positive for cough. Negative for wheezing.   Cardiovascular: Negative for chest pain and palpitations.  Genitourinary: Negative for urgency and flank pain.  Neurological: Positive for weakness. Negative for headaches.  Psychiatric/Behavioral: The patient is not nervous/anxious.        Objective:   Physical Exam  Constitutional: He is oriented to person, place, and time. He appears well-developed. No distress.  Obese Looks tired  HENT:  Mouth/Throat: No oropharyngeal exudate.  eryth throat  Eyes: Conjunctivae are normal. Pupils are equal, round, and reactive to light. Right eye exhibits no discharge.  Neck: Normal range of motion. No JVD present. No thyromegaly present.  Cardiovascular: Normal rate, regular rhythm, normal heart sounds and intact distal pulses.  Exam reveals no gallop and no friction rub.   No murmur heard. Pulmonary/Chest: Effort normal and breath sounds normal. No respiratory distress. He has no wheezes. He has no rales. He exhibits no tenderness.  No rales  Abdominal: Soft. Bowel sounds are normal. He exhibits no distension and no mass. There is no tenderness. There is no rebound and no guarding.  Musculoskeletal: Normal range of motion. He  exhibits no edema and no tenderness.  Lymphadenopathy:    He has no cervical adenopathy.  Neurological: He is alert and oriented to person, place, and time. He has normal reflexes. No cranial nerve deficit. He exhibits normal muscle tone. He displays a negative Romberg sign. Coordination abnormal. Gait normal.  No meningeal signs Tremor  Skin: Skin is warm. No rash noted. He is diaphoretic. No erythema.  Psychiatric: He has a normal mood and affect. His behavior is normal. Judgment and thought content normal.    CXR IMPRESSION:  Left upper lobe -lingular opacity most consistent with pneumonia  with a small left effusion. Recommend followup to ensure clearing.   Lab Results  Component Value Date   WBC 10.6* 03/10/2014   HGB 13.8 03/10/2014   HCT 42.1 03/10/2014   PLT 194.0 03/10/2014   GLUCOSE 157* 03/10/2014   CHOL 133 09/02/2013   TRIG 54.0 09/02/2013   HDL 52.00 09/02/2013   LDLCALC 70 09/02/2013   ALT 47 03/10/2014   AST 59* 03/10/2014   NA 125* 03/10/2014   K 4.3 03/10/2014   CL 91* 03/10/2014   CREATININE 1.2 03/10/2014   BUN 17 03/10/2014   CO2 25 03/10/2014   TSH 0.54 03/13/2007   PSA 0.594 03/13/2013   HGBA1C 7.1* 02/11/2014        Assessment & Plan:

## 2014-03-12 NOTE — Telephone Encounter (Signed)
It was ordered Thx

## 2014-03-12 NOTE — Assessment & Plan Note (Signed)
Resolved

## 2014-03-12 NOTE — Assessment & Plan Note (Signed)
Better  

## 2014-03-12 NOTE — Telephone Encounter (Signed)
Pt stated that Dr. Camila Li told him that he going to be doing x-ray a week before he see Dr. Ronnald Ramp on 04/08/14. Please advise, do not see order in the system.

## 2014-03-12 NOTE — Assessment & Plan Note (Signed)
RTC 3 wks w/a UA

## 2014-03-12 NOTE — Assessment & Plan Note (Signed)
7/15 IMPRESSION:  Left upper lobe -lingular opacity most consistent with pneumonia  with a small left effusion. Recommend followup to ensure clearing.  On Levaquin RTC 2-3 wks

## 2014-03-12 NOTE — Assessment & Plan Note (Signed)
7/15 due to PNA Re-check

## 2014-03-12 NOTE — Telephone Encounter (Signed)
Pt is aware.  

## 2014-03-16 LAB — CULTURE, BLOOD (SINGLE): Organism ID, Bacteria: NO GROWTH

## 2014-04-01 ENCOUNTER — Ambulatory Visit (INDEPENDENT_AMBULATORY_CARE_PROVIDER_SITE_OTHER)
Admission: RE | Admit: 2014-04-01 | Discharge: 2014-04-01 | Disposition: A | Discharge status: Home / Self Care | Source: Ambulatory Visit | Attending: Internal Medicine | Admitting: Internal Medicine

## 2014-04-01 ENCOUNTER — Other Ambulatory Visit (INDEPENDENT_AMBULATORY_CARE_PROVIDER_SITE_OTHER)

## 2014-04-01 DIAGNOSIS — J189 Pneumonia, unspecified organism: Secondary | ICD-10-CM

## 2014-04-01 DIAGNOSIS — R0609 Other forms of dyspnea: Secondary | ICD-10-CM

## 2014-04-01 DIAGNOSIS — R3129 Other microscopic hematuria: Secondary | ICD-10-CM

## 2014-04-01 DIAGNOSIS — E871 Hypo-osmolality and hyponatremia: Secondary | ICD-10-CM

## 2014-04-01 DIAGNOSIS — R0989 Other specified symptoms and signs involving the circulatory and respiratory systems: Secondary | ICD-10-CM

## 2014-04-01 DIAGNOSIS — R0600 Dyspnea, unspecified: Secondary | ICD-10-CM

## 2014-04-01 LAB — CBC WITH DIFFERENTIAL/PLATELET
BASOS ABS: 0 10*3/uL (ref 0.0–0.1)
Basophils Relative: 0.5 % (ref 0.0–3.0)
EOS PCT: 5.3 % — AB (ref 0.0–5.0)
Eosinophils Absolute: 0.4 10*3/uL (ref 0.0–0.7)
HCT: 40 % (ref 39.0–52.0)
Hemoglobin: 13.4 g/dL (ref 13.0–17.0)
LYMPHS PCT: 45.7 % (ref 12.0–46.0)
Lymphs Abs: 3.1 10*3/uL (ref 0.7–4.0)
MCHC: 33.4 g/dL (ref 30.0–36.0)
MCV: 90.5 fl (ref 78.0–100.0)
Monocytes Absolute: 0.5 10*3/uL (ref 0.1–1.0)
Monocytes Relative: 7.8 % (ref 3.0–12.0)
NEUTROS PCT: 40.7 % — AB (ref 43.0–77.0)
Neutro Abs: 2.8 10*3/uL (ref 1.4–7.7)
PLATELETS: 197 10*3/uL (ref 150.0–400.0)
RBC: 4.42 Mil/uL (ref 4.22–5.81)
RDW: 13.9 % (ref 11.5–15.5)
WBC: 6.8 10*3/uL (ref 4.0–10.5)

## 2014-04-01 LAB — BASIC METABOLIC PANEL
BUN: 9 mg/dL (ref 6–23)
CALCIUM: 8.9 mg/dL (ref 8.4–10.5)
CHLORIDE: 108 meq/L (ref 96–112)
CO2: 28 meq/L (ref 19–32)
Creatinine, Ser: 1.2 mg/dL (ref 0.4–1.5)
GFR: 77.89 mL/min (ref >60.00)
Glucose, Bld: 114 mg/dL — ABNORMAL HIGH (ref 70–99)
POTASSIUM: 4.3 meq/L (ref 3.5–5.1)
SODIUM: 139 meq/L (ref 135–145)

## 2014-04-01 LAB — URINALYSIS
BILIRUBIN URINE: NEGATIVE
HGB URINE DIPSTICK: NEGATIVE
Ketones, ur: NEGATIVE
LEUKOCYTES UA: NEGATIVE
Nitrite: NEGATIVE
Specific Gravity, Urine: <=1.005 — AB (ref 1.000–1.030)
Total Protein, Urine: NEGATIVE
Urine Glucose: NEGATIVE
Urobilinogen, UA: 0.2 (ref 0.0–1.0)
pH: 7 (ref 5.0–8.0)

## 2014-04-08 ENCOUNTER — Other Ambulatory Visit (INDEPENDENT_AMBULATORY_CARE_PROVIDER_SITE_OTHER)

## 2014-04-08 ENCOUNTER — Ambulatory Visit (INDEPENDENT_AMBULATORY_CARE_PROVIDER_SITE_OTHER): Admitting: Internal Medicine

## 2014-04-08 ENCOUNTER — Encounter: Payer: Self-pay | Admitting: Internal Medicine

## 2014-04-08 VITALS — BP 140/80 | HR 87 | Temp 98.2°F | Resp 16 | Ht 70.0 in | Wt 234.6 lb

## 2014-04-08 DIAGNOSIS — IMO0001 Reserved for inherently not codable concepts without codable children: Secondary | ICD-10-CM

## 2014-04-08 DIAGNOSIS — R778 Other specified abnormalities of plasma proteins: Secondary | ICD-10-CM

## 2014-04-08 DIAGNOSIS — Z23 Encounter for immunization: Secondary | ICD-10-CM

## 2014-04-08 DIAGNOSIS — R799 Abnormal finding of blood chemistry, unspecified: Secondary | ICD-10-CM

## 2014-04-08 DIAGNOSIS — I10 Essential (primary) hypertension: Secondary | ICD-10-CM

## 2014-04-08 DIAGNOSIS — E785 Hyperlipidemia, unspecified: Secondary | ICD-10-CM

## 2014-04-08 DIAGNOSIS — R0609 Other forms of dyspnea: Secondary | ICD-10-CM

## 2014-04-08 DIAGNOSIS — R0600 Dyspnea, unspecified: Secondary | ICD-10-CM

## 2014-04-08 DIAGNOSIS — E1165 Type 2 diabetes mellitus with hyperglycemia: Secondary | ICD-10-CM

## 2014-04-08 DIAGNOSIS — R0989 Other specified symptoms and signs involving the circulatory and respiratory systems: Secondary | ICD-10-CM

## 2014-04-08 LAB — COMPREHENSIVE METABOLIC PANEL
ALK PHOS: 97 U/L (ref 39–117)
ALT: 22 U/L (ref 0–53)
AST: 20 U/L (ref 0–37)
Albumin: 3.4 g/dL — ABNORMAL LOW (ref 3.5–5.2)
BUN: 9 mg/dL (ref 6–23)
CALCIUM: 9.2 mg/dL (ref 8.4–10.5)
CO2: 26 mEq/L (ref 19–32)
CREATININE: 1.1 mg/dL (ref 0.4–1.5)
Chloride: 107 mEq/L (ref 96–112)
GFR: 90.62 mL/min (ref >60.00)
Glucose, Bld: 131 mg/dL — ABNORMAL HIGH (ref 70–99)
Potassium: 4 mEq/L (ref 3.5–5.1)
Sodium: 138 mEq/L (ref 135–145)
Total Bilirubin: 0.6 mg/dL (ref 0.2–1.2)
Total Protein: 7.1 g/dL (ref 6.0–8.3)

## 2014-04-08 MED ORDER — PNEUMOCOCCAL VAC POLYVALENT 25 MCG/0.5ML IJ INJ: 0.5000 mL | INJECTION | INTRAMUSCULAR | Status: DC

## 2014-04-08 NOTE — Patient Instructions (Signed)

## 2014-04-08 NOTE — Progress Notes (Signed)
Pre visit review using our clinic review tool, if applicable. No additional management support is needed unless otherwise documented below in the visit note. 

## 2014-04-08 NOTE — Progress Notes (Signed)
   Subjective:    Patient ID: Brandon Shannon, male    DOB: 1955/11/04, 58 y.o.   MRN: [id number]  HPI Comments: He had labs done at the [place] that revealed an elevated total protein, he returns here for further evaluation.     Review of Systems  Constitutional: Negative.  Negative for fever, chills, diaphoresis, appetite change and fatigue.  HENT: Negative.   Eyes: Negative.   Respiratory: Negative.  Negative for cough, choking, chest tightness, shortness of breath, wheezing and stridor.   Cardiovascular: Negative.  Negative for chest pain, palpitations and leg swelling.  Gastrointestinal: Negative.  Negative for nausea, vomiting, abdominal pain, diarrhea and blood in stool.  Endocrine: Negative.   Genitourinary: Negative.   Musculoskeletal: Negative.  Negative for arthralgias, back pain and myalgias.  Skin: Negative.  Negative for rash.  Allergic/Immunologic: Negative.   Neurological: Negative.   Hematological: Negative.  Negative for adenopathy. Does not bruise/bleed easily.  Psychiatric/Behavioral: Negative.        Objective:   Physical Exam  Vitals reviewed. Constitutional: He is oriented to person, place, and time. He appears well-developed and well-nourished. No distress.  HENT:  Head: Normocephalic and atraumatic.  Mouth/Throat: Oropharynx is clear and moist. No oropharyngeal exudate.  Eyes: Conjunctivae are normal. Right eye exhibits no discharge. Left eye exhibits no discharge. No scleral icterus.  Neck: Normal range of motion. Neck supple. No JVD present. No tracheal deviation present. No thyromegaly present.  Cardiovascular: Normal rate, regular rhythm, normal heart sounds and intact distal pulses.  Exam reveals no gallop and no friction rub.   No murmur heard. Pulmonary/Chest: Effort normal and breath sounds normal. No stridor. No respiratory distress. He has no wheezes. He has no rales. He exhibits no tenderness.  Abdominal: Soft. Bowel sounds are normal. He exhibits  no distension and no mass. There is no tenderness. There is no rebound and no guarding.  Musculoskeletal: Normal range of motion. He exhibits no edema and no tenderness.  Lymphadenopathy:    He has no cervical adenopathy.  Neurological: He is oriented to person, place, and time.  Skin: Skin is warm and dry. No rash noted. He is not diaphoretic. No erythema. No pallor.     Lab Results  Component Value Date   WBC 6.8 04/01/2014   HGB 13.4 04/01/2014   HCT 40.0 04/01/2014   PLT 197.0 04/01/2014   GLUCOSE 114* 04/01/2014   CHOL 133 09/02/2013   TRIG 54.0 09/02/2013   HDL 52.00 09/02/2013   LDLCALC 70 09/02/2013   ALT 47 03/10/2014   AST 59* 03/10/2014   NA 139 04/01/2014   K 4.3 04/01/2014   CL 108 04/01/2014   CREATININE 1.2 04/01/2014   BUN 9 04/01/2014   CO2 28 04/01/2014   TSH 0.54 03/13/2007   PSA 0.594 03/13/2013   HGBA1C 7.1* 02/11/2014       Assessment & Plan:

## 2014-04-09 ENCOUNTER — Encounter: Payer: Self-pay | Admitting: Internal Medicine

## 2014-04-09 LAB — CBC
BASO%: 1 %
BASOS ABS: 0 /uL
EOS ABS: 0 /uL
EOS%: 7 %
HEMATOCRIT: 44 %
HEMOGLOBIN: 14.8 g/dL
LYMPH#: 2.95
LYMPH%: 36 %
MCH: 29.8
MCHC: 33.6
MCV (KUC): 88.9 fL (ref 80–98)
MONO#: 0.67
MONO%: 8 %
MPV: 11.2 fL (ref 7.5–11.5)
NEUT%: 55 %
NEUTROS ABS: 4.56
Platelets: 353 10*3/uL
RBC: 4.96
RDW-CV: 13.2
RDW-SD: 41.9
WBC: 8.28

## 2014-04-09 LAB — LIPID PANEL
CHOLESTEROL: 187 mg/dL (ref 0–200)
HDL: 48 mg/dL (ref 35–70)
HEMOGLOBIN A1C: 6.8 % — AB (ref 4.0–6.0)
LDL (calc): 124
Triglycerides: 73

## 2014-04-09 LAB — URINALYSIS
LEUKOCYTE ESTERASE: NEGATIVE
Nitrite: NEGATIVE
PH UR, DRUG SCRN: 7
PROTEIN UR: NEGATIVE
SPECIFIC GRAVITY, URINE: 1.015
Urine Glucose: NEGATIVE

## 2014-04-09 LAB — CMP AND LIVER
ALBUMIN: 3.5
ALBUMIN: 3.5
ALT: 41
AST: 20 U/L
Alkaline Phosphatase: 147 U/L
Bilirubin, Direct (Micro): 0.2
CO2: 28 mmol/L
CREATININE: 1.2
Calcium: 9.4 mg/dL
Chloride: 104 mmol/L
GLUCOSE: 115
Potassium: 4.1 mmol/L
Protein: 8.4
Sodium: 138 mmol/L (ref 137–147)
Total Bilirubin: 0.7 mg/dL
UREA NITROGEN: 13

## 2014-04-10 ENCOUNTER — Encounter: Payer: Self-pay | Admitting: Internal Medicine

## 2014-04-10 LAB — SPEP & IFE WITH QIG
Albumin ELP: 51.5 % — ABNORMAL LOW (ref 55.8–66.1)
Alpha-1-Globulin: 5.8 % — ABNORMAL HIGH (ref 2.9–4.9)
Alpha-2-Globulin: 9.3 % (ref 7.1–11.8)
BETA 2: 7 % — AB (ref 3.2–6.5)
Beta Globulin: 6.9 % (ref 4.7–7.2)
Gamma Globulin: 19.5 % — ABNORMAL HIGH (ref 11.1–18.8)
IGA: 453 mg/dL — AB (ref 68–379)
IGM, SERUM: 132 mg/dL (ref 41–251)
IgG (Immunoglobin G), Serum: 1480 mg/dL (ref 650–1600)
TOTAL PROTEIN, SERUM ELECTROPHOR: 7.1 g/dL (ref 6.0–8.3)

## 2014-04-10 NOTE — Assessment & Plan Note (Signed)
His blood sugars are adequately well controlled 

## 2014-04-10 NOTE — Assessment & Plan Note (Signed)
lipitor was restarted

## 2014-04-10 NOTE — Assessment & Plan Note (Signed)
His BP is well controlled 

## 2014-04-10 NOTE — Assessment & Plan Note (Signed)
Repeat testing today is all WNL

## 2014-04-19 ENCOUNTER — Encounter: Payer: Self-pay | Admitting: Internal Medicine

## 2014-04-28 ENCOUNTER — Ambulatory Visit: Admitting: *Deleted

## 2014-05-08 ENCOUNTER — Telehealth: Payer: Self-pay | Admitting: Internal Medicine

## 2014-05-08 NOTE — Telephone Encounter (Signed)
Patient believes it is time to be referred to GI for a colonoscopy.  He is requesting a call back.

## 2014-05-08 NOTE — Telephone Encounter (Signed)
Spoke with patient and advised last colon was 10/27/04 and not due until 10/2014. Also updated system to reflect shingles given at [place].

## 2014-09-12 ENCOUNTER — Telehealth: Payer: Self-pay

## 2014-09-12 ENCOUNTER — Ambulatory Visit (INDEPENDENT_AMBULATORY_CARE_PROVIDER_SITE_OTHER): Admitting: Family Medicine

## 2014-09-12 VITALS — BP 130/70 | HR 110 | Temp 97.4°F | Resp 18 | Ht 71.0 in | Wt 252.4 lb

## 2014-09-12 DIAGNOSIS — I776 Arteritis, unspecified: Secondary | ICD-10-CM

## 2014-09-12 MED ORDER — PREDNISONE 20 MG PO TABS: 40.0000 mg | ORAL_TABLET | Freq: Every day | ORAL | Status: DC

## 2014-09-12 NOTE — Progress Notes (Signed)
° °  Subjective:    Patient ID: Brandon Shannon, male    DOB: Jan 01, 1956, 59 y.o.   MRN: [id number] This chart was scribed for Brandon Haber, MD by [name], Medical Scribe. This patient was seen in Room  and the patient's care was started a 10:00 AM.  Chief Complaint  Patient presents with   Rash    x4 days; itches and have tried OTC creams without any relief;  on both legs    HPI HPI Comments: Brandon Shannon is a 59 y.o. male with a recent surgery on the left leg and a hx of HTN, HLD and DM who presents to [facility] complaining of rash on bilateral legs for the last four days. Pt endorses associated pruritis, bilaterally. Pt denies rash on hands. Pt states he was prescribed hydrocodone for his surgery, which he stopped four days ago. Pt states that he has used OTC antihistamine creams on his rash without relief.     Review of Systems  Constitutional: Negative for fever and chills.  Musculoskeletal: Positive for gait problem.  Skin: Positive for color change, rash and wound.       Objective:   Physical Exam  Constitutional: He is oriented to person, place, and time. He appears well-developed and well-nourished.  HENT:  Head: Normocephalic and atraumatic.  Eyes: Pupils are equal, round, and reactive to light.  Neck: Neck supple.  Cardiovascular: Normal rate and regular rhythm.   Pulmonary/Chest: Effort normal and breath sounds normal. No respiratory distress.  Neurological: He is alert and oriented to person, place, and time.  Skin: Skin is warm and dry. Rash noted.  Eczematous rash on bilateral lower legs.  Psychiatric: He has a normal mood and affect. His behavior is normal.  Nursing note and vitals reviewed.      Assessment & Plan:   This chart was scribed in my presence and reviewed by me personally.    ICD-9-CM ICD-10-CM   1. Vasculitis 447.6 I77.6 predniSONE (DELTASONE) 20 MG tablet     Signed, Brandon Haber, MD

## 2014-09-12 NOTE — Telephone Encounter (Signed)
Error

## 2014-09-17 ENCOUNTER — Other Ambulatory Visit: Payer: Self-pay | Admitting: Internal Medicine

## 2014-09-17 ENCOUNTER — Encounter: Payer: Self-pay | Admitting: Internal Medicine

## 2014-09-17 DIAGNOSIS — Z1211 Encounter for screening for malignant neoplasm of colon: Secondary | ICD-10-CM | POA: Insufficient documentation

## 2014-10-07 ENCOUNTER — Encounter: Payer: Self-pay | Admitting: Gastroenterology

## 2014-11-24 ENCOUNTER — Ambulatory Visit (AMBULATORY_SURGERY_CENTER): Payer: Self-pay | Admitting: *Deleted

## 2014-11-24 VITALS — Ht 69.0 in | Wt 243.0 lb

## 2014-11-24 DIAGNOSIS — Z1211 Encounter for screening for malignant neoplasm of colon: Secondary | ICD-10-CM

## 2014-11-24 MED ORDER — NA SULFATE-K SULFATE-MG SULF 17.5-3.13-1.6 GM/177ML PO SOLN: 1.0000 | Freq: Once | ORAL | Status: DC

## 2014-11-24 NOTE — Progress Notes (Signed)
No egg or soy allergy No home 02 No diet pills No issues with past sedation No problems with constipation Declined emmi

## 2014-12-08 ENCOUNTER — Ambulatory Visit (AMBULATORY_SURGERY_CENTER): Admitting: Gastroenterology

## 2014-12-08 ENCOUNTER — Encounter: Payer: Self-pay | Admitting: Gastroenterology

## 2014-12-08 VITALS — BP 146/90 | HR 74 | Temp 96.6°F | Resp 19 | Ht 69.0 in | Wt 243.0 lb

## 2014-12-08 DIAGNOSIS — D124 Benign neoplasm of descending colon: Secondary | ICD-10-CM

## 2014-12-08 DIAGNOSIS — K5730 Diverticulosis of large intestine without perforation or abscess without bleeding: Secondary | ICD-10-CM

## 2014-12-08 DIAGNOSIS — Z1211 Encounter for screening for malignant neoplasm of colon: Secondary | ICD-10-CM | POA: Diagnosis present

## 2014-12-08 HISTORY — PX: COLONOSCOPY WITH PROPOFOL: SHX5780

## 2014-12-08 MED ORDER — SODIUM CHLORIDE 0.9 % IV SOLN: 500.0000 mL | INTRAVENOUS | Status: DC

## 2014-12-08 NOTE — Progress Notes (Signed)
Called to room to assist during endoscopic procedure.  Patient ID and intended procedure confirmed with present staff. Received instructions for my participation in the procedure from the performing physician.  

## 2014-12-08 NOTE — Op Note (Signed)
[place]  [address] [place], [address]   COLONOSCOPY PROCEDURE REPORT  PATIENT: Brandon, Shannon  MR#: [id number] BIRTHDATE: [birth year] , 58  yrs. old GENDER: male ENDOSCOPIST: Inda Castle, MD REFERRED QP:[id number] Evalina Field, M.D. PROCEDURE DATE:  12/08/2014 PROCEDURE:   Colonoscopy with snare polypectomy and Colonoscopy, screening First Screening Colonoscopy - Avg.  risk and is 50 yrs.  old or older - No.  Prior Negative Screening - Now for repeat screening. 10 or more years since last screening  History of Adenoma - Now for follow-up colonoscopy & has been > or = to 3 yrs.  N/A ASA CLASS:   Class II INDICATIONS:Colorectal Neoplasm Risk Assessment for this procedure is average risk. MEDICATIONS: Monitored anesthesia care and Propofol 250 mg IV  DESCRIPTION OF PROCEDURE:   After the risks benefits and alternatives of the procedure were thoroughly explained, informed consent was obtained.  The digital rectal exam revealed no abnormalities of the rectum.   The LB [id number]  endoscope was introduced through the anus and advanced to the ileum. No adverse events experienced.   The quality of the prep was (Suprep was used) good.  The instrument was then slowly withdrawn as the colon was fully examined.      COLON FINDINGS: A sessile polyp measuring 3 mm in size was found in the descending colon.  Polypectomies were performed with a cold snare.  The resection was complete, the polyp tissue was completely retrieved and sent to histology.   There was moderate diverticulosis noted in the sigmoid colon.  Retroflexed views revealed no abnormalities. The time to cecum = 5.7 Withdrawal time = 7.3   The scope was withdrawn and the procedure completed. COMPLICATIONS: There were no immediate complications.  ENDOSCOPIC IMPRESSION: 1.   Sessile polyp was found in the descending colon; polypectomies were performed with a cold snare 2.   Moderate diverticulosis  was noted in the sigmoid colon  RECOMMENDATIONS: If the polyp(s) removed today are proven to be adenomatous (pre-cancerous) polyps, you will need a repeat colonoscopy in 5 years.  Otherwise you should continue to follow colorectal cancer screening guidelines for "routine risk" patients with colonoscopy in 10 years.  You will receive a letter within 1-2 weeks with the results of your biopsy as well as final recommendations.  Please call my office if you have not received a letter after 3 weeks.  eSigned:  Inda Castle, MD 12/08/2014 9:18 AM   cc:   PATIENT NAME:  Brandon, Shannon MR#: [id number]

## 2014-12-08 NOTE — Progress Notes (Signed)
Pt awake and alert, pleased with MAC, Report to RN

## 2014-12-08 NOTE — Progress Notes (Signed)
Pt awake and alert, vss, pleased with MAC. Report to rn

## 2014-12-08 NOTE — Patient Instructions (Signed)
YOU HAD AN ENDOSCOPIC PROCEDURE TODAY AT [facility]:   Refer to the procedure report that was given to you for any specific questions about what was found during the examination.  If the procedure report does not answer your questions, please call your gastroenterologist to clarify.  If you requested that your care partner not be given the details of your procedure findings, then the procedure report has been included in a sealed envelope for you to review at your convenience later.  YOU SHOULD EXPECT: Some feelings of bloating in the abdomen. Passage of more gas than usual.  Walking can help get rid of the air that was put into your GI tract during the procedure and reduce the bloating. If you had a lower endoscopy (such as a colonoscopy or flexible sigmoidoscopy) you may notice spotting of blood in your stool or on the toilet paper. If you underwent a bowel prep for your procedure, you may not have a normal bowel movement for a few days.  Please Note:  You might notice some irritation and congestion in your nose or some drainage.  This is from the oxygen used during your procedure.  There is no need for concern and it should clear up in a day or so.  SYMPTOMS TO REPORT IMMEDIATELY:   Following lower endoscopy (colonoscopy or flexible sigmoidoscopy):  Excessive amounts of blood in the stool  Significant tenderness or worsening of abdominal pains  Swelling of the abdomen that is new, acute  Fever of 100F or higher  For urgent or emergent issues, a gastroenterologist can be reached at any hour by calling [phone].   DIET: Your first meal following the procedure should be a small meal and then it is ok to progress to your normal diet. Heavy or fried foods are harder to digest and may make you feel nauseous or bloated.  Likewise, meals heavy in dairy and vegetables can increase bloating.  Drink plenty of fluids but you should avoid alcoholic beverages for 24  hours.  ACTIVITY:  You should plan to take it easy for the rest of today and you should NOT DRIVE or use heavy machinery until tomorrow (because of the sedation medicines used during the test).    FOLLOW UP: Our staff will call the number listed on your records the next business day following your procedure to check on you and address any questions or concerns that you may have regarding the information given to you following your procedure. If we do not reach you, we will leave a message.  However, if you are feeling well and you are not experiencing any problems, there is no need to return our call.  We will assume that you have returned to your regular daily activities without incident.  If any biopsies were taken you will be contacted by phone or by letter within the next 1-3 weeks.  Please call us at [phone] if you have not heard about the biopsies in 3 weeks.    SIGNATURES/CONFIDENTIALITY: You and/or your care partner have signed paperwork which will be entered into your electronic medical record.  These signatures attest to the fact that that the information above on your After Visit Summary has been reviewed and is understood.  Full responsibility of the confidentiality of this discharge information lies with you and/or your care-partner.  Recommendations Next colonoscopy determined by pathology results; 5 or 10 years. Discharge instructions provided to patient and/or care partner. Polyp, diverticulosis, and high fiber diet  handouts provided.

## 2014-12-09 ENCOUNTER — Telehealth: Payer: Self-pay | Admitting: *Deleted

## 2014-12-09 NOTE — Telephone Encounter (Signed)
  Follow up Call-  Call back number 12/08/2014  Post procedure Call Back phone  # [phone]  Permission to leave phone message Yes     Patient questions:  Do you have a fever, pain , or abdominal swelling? No. Pain Score  0 *  Have you tolerated food without any problems? Yes.    Have you been able to return to your normal activities? Yes.    Do you have any questions about your discharge instructions: Diet   No. Medications  No. Follow up visit  No.  Do you have questions or concerns about your Care? No.  Actions: * If pain score is 4 or above: No action needed, pain <4.

## 2014-12-16 ENCOUNTER — Encounter: Payer: Self-pay | Admitting: Gastroenterology

## 2014-12-19 LAB — HM COLONOSCOPY

## 2015-01-04 ENCOUNTER — Ambulatory Visit (INDEPENDENT_AMBULATORY_CARE_PROVIDER_SITE_OTHER): Admitting: Physician Assistant

## 2015-01-04 VITALS — BP 132/82 | HR 65 | Temp 97.6°F | Resp 18 | Ht 70.75 in | Wt 237.6 lb

## 2015-01-04 DIAGNOSIS — J069 Acute upper respiratory infection, unspecified: Secondary | ICD-10-CM | POA: Diagnosis not present

## 2015-01-04 MED ORDER — BENZONATATE 100 MG PO CAPS: 100.0000 mg | ORAL_CAPSULE | Freq: Three times a day (TID) | ORAL | Status: DC | PRN

## 2015-01-04 MED ORDER — IPRATROPIUM BROMIDE 0.03 % NA SOLN: 2.0000 | Freq: Two times a day (BID) | NASAL | Status: DC

## 2015-01-04 MED ORDER — GUAIFENESIN ER 1200 MG PO TB12: 1.0000 | ORAL_TABLET | Freq: Two times a day (BID) | ORAL | Status: DC | PRN

## 2015-01-04 NOTE — Progress Notes (Signed)
   Subjective:    Patient ID: Brandon Shannon, male    DOB: [birth year], 59 y.o.   MRN: [id number]  HPI Patient presents for nasal and chest congestion that have been present for past 10 days. Symptom are accompanied by sore throat, sinus pressure, dry cough, and chills at times. Denies fever, HA, ear pain, SOB/CP, wheezing, change in appetite, or vomiting. H/o allergies, but not asthma. Does not smoke. Sick contacts include brother. Has tried Day/NightQuil without relief. NKDA.    Review of Systems  Constitutional: Positive for chills. Negative for fever, appetite change and fatigue.  HENT: Positive for congestion, rhinorrhea, sinus pressure and sore throat. Negative for ear discharge, ear pain, postnasal drip, sneezing and trouble swallowing.   Respiratory: Positive for cough. Negative for shortness of breath and wheezing.   Cardiovascular: Negative for chest pain.  Gastrointestinal: Positive for nausea. Negative for vomiting.  Neurological: Negative for dizziness and headaches.       Objective:   Physical Exam  Constitutional: He is oriented to person, place, and time. He appears well-developed and well-nourished. No distress.  Blood pressure 132/82, pulse 65, temperature 97.6 F (36.4 C), temperature source Oral, resp. rate 18, height 5' 10.75" (1.797 m), weight 237 lb 9.6 oz (107.775 kg), SpO2 98 %.  HENT:  Head: Normocephalic and atraumatic.  Right Ear: Tympanic membrane, external ear and ear canal normal.  Left Ear: Tympanic membrane, external ear and ear canal normal.  Nose: Rhinorrhea (with mild erythema) present. No mucosal edema or sinus tenderness. Right sinus exhibits no maxillary sinus tenderness and no frontal sinus tenderness. Left sinus exhibits no frontal sinus tenderness.  Mouth/Throat: Uvula is midline and mucous membranes are normal. Posterior oropharyngeal erythema present. No oropharyngeal exudate or posterior oropharyngeal edema.  Tonsils 2+ hypertrophic with  erythema and without exudate.  Eyes: Conjunctivae are normal. Pupils are equal, round, and reactive to light. Right eye exhibits no discharge. Left eye exhibits no discharge. No scleral icterus.  Neck: Neck supple. No thyromegaly present.  Cardiovascular: Normal rate, regular rhythm and normal heart sounds.  Exam reveals no gallop and no friction rub.   No murmur heard. Pulmonary/Chest: Effort normal and breath sounds normal. No respiratory distress. He has no wheezes. He has no rales.  Lymphadenopathy:    He has no cervical adenopathy.  Neurological: He is alert and oriented to person, place, and time.  Skin: Skin is warm and dry. No rash noted. He is not diaphoretic. No erythema. No pallor.       Assessment & Plan:  1. Acute upper respiratory infection Plenty of water with mucinex.  - benzonatate (TESSALON) 100 MG capsule; Take 1-2 capsules (100-200 mg total) by mouth 3 (three) times daily as needed for cough.  Dispense: 40 capsule; Refill: 0 - ipratropium (ATROVENT) 0.03 % nasal spray; Place 2 sprays into both nostrils 2 (two) times daily.  Dispense: 30 mL; Refill: 0 - Guaifenesin (MUCINEX MAXIMUM STRENGTH) 1200 MG TB12; Take 1 tablet (1,200 mg total) by mouth every 12 (twelve) hours as needed.  Dispense: 14 tablet; Refill: G[place] PA-C  [facility] 01/04/2015 8:42 AM

## 2015-01-04 NOTE — Patient Instructions (Signed)
Upper Respiratory Infection, Adult An upper respiratory infection (URI) is also sometimes known as the common cold. The upper respiratory tract includes the nose, sinuses, throat, trachea, and bronchi. Bronchi are the airways leading to the lungs. Most people improve within 1 week, but symptoms can last up to 2 weeks. A residual cough may last even longer.  CAUSES Many different viruses can infect the tissues lining the upper respiratory tract. The tissues become irritated and inflamed and often become very moist. Mucus production is also common. A cold is contagious. You can easily spread the virus to others by oral contact. This includes kissing, sharing a glass, coughing, or sneezing. Touching your mouth or nose and then touching a surface, which is then touched by another person, can also spread the virus. SYMPTOMS  Symptoms typically develop 1 to 3 days after you come in contact with a cold virus. Symptoms vary from person to person. They may include:  Runny nose.  Sneezing.  Nasal congestion.  Sinus irritation.  Sore throat.  Loss of voice (laryngitis).  Cough.  Fatigue.  Muscle aches.  Loss of appetite.  Headache.  Low-grade fever. DIAGNOSIS  You might diagnose your own cold based on familiar symptoms, since most people get a cold 2 to 3 times a year. Your caregiver can confirm this based on your exam. Most importantly, your caregiver can check that your symptoms are not due to another disease such as strep throat, sinusitis, pneumonia, asthma, or epiglottitis. Blood tests, throat tests, and X-rays are not necessary to diagnose a common cold, but they may sometimes be helpful in excluding other more serious diseases. Your caregiver will decide if any further tests are required. RISKS AND COMPLICATIONS  You may be at risk for a more severe case of the common cold if you smoke cigarettes, have chronic heart disease (such as heart failure) or lung disease (such as asthma), or if  you have a weakened immune system. The very young and very old are also at risk for more serious infections. Bacterial sinusitis, middle ear infections, and bacterial pneumonia can complicate the common cold. The common cold can worsen asthma and chronic obstructive pulmonary disease (COPD). Sometimes, these complications can require emergency medical care and may be life-threatening. PREVENTION  The best way to protect against getting a cold is to practice good hygiene. Avoid oral or hand contact with people with cold symptoms. Wash your hands often if contact occurs. There is no clear evidence that vitamin C, vitamin E, echinacea, or exercise reduces the chance of developing a cold. However, it is always recommended to get plenty of rest and practice good nutrition. TREATMENT  Treatment is directed at relieving symptoms. There is no cure. Antibiotics are not effective, because the infection is caused by a virus, not by bacteria. Treatment may include:  Increased fluid intake. Sports drinks offer valuable electrolytes, sugars, and fluids.  Breathing heated mist or steam (vaporizer or shower).  Eating chicken soup or other clear broths, and maintaining good nutrition.  Getting plenty of rest.  Using gargles or lozenges for comfort.  Controlling fevers with ibuprofen or acetaminophen as directed by your caregiver.  Increasing usage of your inhaler if you have asthma. Zinc gel and zinc lozenges, taken in the first 24 hours of the common cold, can shorten the duration and lessen the severity of symptoms. Pain medicines may help with fever, muscle aches, and throat pain. A variety of non-prescription medicines are available to treat congestion and runny nose. Your caregiver   can make recommendations and may suggest nasal or lung inhalers for other symptoms.  HOME CARE INSTRUCTIONS   Only take over-the-counter or prescription medicines for pain, discomfort, or fever as directed by your  caregiver.  Use a warm mist humidifier or inhale steam from a shower to increase air moisture. This may keep secretions moist and make it easier to breathe.  Drink enough water and fluids to keep your urine clear or pale yellow.  Rest as needed.  Return to work when your temperature has returned to normal or as your caregiver advises. You may need to stay home longer to avoid infecting others. You can also use a face mask and careful hand washing to prevent spread of the virus. SEEK MEDICAL CARE IF:   After the first few days, you feel you are getting worse rather than better.  You need your caregiver's advice about medicines to control symptoms.  You develop chills, worsening shortness of breath, or brown or red sputum. These may be signs of pneumonia.  You develop yellow or brown nasal discharge or pain in the face, especially when you bend forward. These may be signs of sinusitis.  You develop a fever, swollen neck glands, pain with swallowing, or white areas in the back of your throat. These may be signs of strep throat. SEEK IMMEDIATE MEDICAL CARE IF:   You have a fever.  You develop severe or persistent headache, ear pain, sinus pain, or chest pain.  You develop wheezing, a prolonged cough, cough up blood, or have a change in your usual mucus (if you have chronic lung disease).  You develop sore muscles or a stiff neck. Document Released: 02/07/2001 Document Revised: 11/06/2011 Document Reviewed: 11/19/2013 ExitCare Patient Information 2015 [employer]. This information is not intended to replace advice given to you by your health care provider. Make sure you discuss any questions you have with your health care provider.  

## 2015-01-07 ENCOUNTER — Telehealth: Payer: Self-pay | Admitting: Physician Assistant

## 2015-01-07 MED ORDER — AMOXICILLIN-POT CLAVULANATE 875-125 MG PO TABS: 1.0000 | ORAL_TABLET | Freq: Two times a day (BID) | ORAL | Status: DC

## 2015-01-07 NOTE — Telephone Encounter (Signed)
Patient saw Brandon Heimlich, PA-C on 01/04/2015 for an upper respiratory problem. Patient states that he was instructed to call back and ask for an antibiotic if he is not feeling any better. Patient states that he only wants to speak with Tishira. Also stated that if he does not hear anything within an hour he is going to call back and ask to speak with me [facility]) to see why his message has not been handled yet. Advised patient that it is not necessary to call back and ask for me. Our phone calls can take up to 24 hours and Tishira or a clinical staff member will be happy to evaluate his message and get him an antibiotic as soon as they are able to.  [phone]

## 2015-01-07 NOTE — Telephone Encounter (Signed)
Spoke with patient and is only marginally better from last OV. Congestion is unchanged and cough is not better. Would like antibiotic. Sent RX to pharmacy.

## 2015-01-12 ENCOUNTER — Encounter: Payer: Self-pay | Admitting: Internal Medicine

## 2015-01-12 ENCOUNTER — Ambulatory Visit (INDEPENDENT_AMBULATORY_CARE_PROVIDER_SITE_OTHER)
Admission: RE | Admit: 2015-01-12 | Discharge: 2015-01-12 | Disposition: A | Discharge status: Home / Self Care | Source: Ambulatory Visit | Attending: Internal Medicine | Admitting: Internal Medicine

## 2015-01-12 ENCOUNTER — Ambulatory Visit (INDEPENDENT_AMBULATORY_CARE_PROVIDER_SITE_OTHER): Admitting: Internal Medicine

## 2015-01-12 VITALS — BP 118/70 | HR 80 | Temp 97.9°F | Resp 16 | Ht 70.75 in | Wt 236.0 lb

## 2015-01-12 DIAGNOSIS — J209 Acute bronchitis, unspecified: Secondary | ICD-10-CM | POA: Diagnosis not present

## 2015-01-12 DIAGNOSIS — R05 Cough: Secondary | ICD-10-CM | POA: Diagnosis not present

## 2015-01-12 DIAGNOSIS — R059 Cough, unspecified: Secondary | ICD-10-CM | POA: Insufficient documentation

## 2015-01-12 MED ORDER — AZITHROMYCIN 500 MG PO TABS: 500.0000 mg | ORAL_TABLET | Freq: Every day | ORAL | Status: DC

## 2015-01-12 MED ORDER — HYDROCODONE-HOMATROPINE 5-1.5 MG/5ML PO SYRP: 5.0000 mL | ORAL_SOLUTION | Freq: Three times a day (TID) | ORAL | Status: DC | PRN

## 2015-01-12 NOTE — Progress Notes (Signed)
Subjective:  Patient ID: Brandon Shannon, male    DOB: 08-02-1956  Age: 59 y.o. MRN: [id number]  CC: Cough   HPI Brandon Shannon presents for cough, persistent for several weeks, productive of scant phlegm with ST, chills, and post-nasal drip.  Outpatient Prescriptions Prior to Visit  Medication Sig Dispense Refill  . atorvastatin (LIPITOR) 80 MG tablet TAKE 1 TABLET EVERY DAY 90 tablet 3  . Cholecalciferol 50000 UNITS TABS Take 1 tablet by mouth once a week. 12 tablet 3  . ipratropium (ATROVENT) 0.03 % nasal spray Place 2 sprays into both nostrils 2 (two) times daily. 30 mL 0  . valsartan (DIOVAN) 160 MG tablet Take 160 mg by mouth daily.    Marland Kitchen ALPRAZolam (XANAX) 0.25 MG tablet Take 1 tablet (0.25 mg total) by mouth 2 (two) times daily as needed for anxiety. (Patient not taking: Reported on 01/12/2015) 30 tablet 1  . amoxicillin-clavulanate (AUGMENTIN) 875-125 MG per tablet Take 1 tablet by mouth 2 (two) times daily. (Patient not taking: Reported on 01/12/2015) 14 tablet 0  . benzonatate (TESSALON) 100 MG capsule Take 1-2 capsules (100-200 mg total) by mouth 3 (three) times daily as needed for cough. (Patient not taking: Reported on 01/12/2015) 40 capsule 0  . Guaifenesin (MUCINEX MAXIMUM STRENGTH) 1200 MG TB12 Take 1 tablet (1,200 mg total) by mouth every 12 (twelve) hours as needed. (Patient not taking: Reported on 01/12/2015) 14 tablet 1  . pneumococcal 23 valent vaccine (PNU-IMMUNE) injection 0.5 mL      No facility-administered medications prior to visit.    ROS Review of Systems  Constitutional: Positive for chills. Negative for fever, diaphoresis, activity change, appetite change, fatigue and unexpected weight change.  HENT: Positive for congestion, postnasal drip and rhinorrhea. Negative for facial swelling, nosebleeds, sinus pressure, trouble swallowing and voice change.   Eyes: Negative.   Respiratory: Negative.  Negative for apnea, cough, choking, chest tightness, shortness of  breath, wheezing and stridor.   Cardiovascular: Negative.  Negative for chest pain, palpitations and leg swelling.  Gastrointestinal: Negative.  Negative for vomiting, abdominal pain, diarrhea, constipation and blood in stool.  Endocrine: Negative.   Genitourinary: Negative.   Musculoskeletal: Negative.  Negative for myalgias.  Skin: Negative.   Allergic/Immunologic: Negative.   Neurological: Negative.  Negative for dizziness.  Hematological: Negative.  Negative for adenopathy. Does not bruise/bleed easily.  Psychiatric/Behavioral: Negative for sleep disturbance, dysphoric mood and decreased concentration. The patient is nervous/anxious.     Objective:  BP 118/70 mmHg  Pulse 80  Temp(Src) 97.9 F (36.6 C) (Oral)  Resp 16  Ht 5' 10.75" (1.797 m)  Wt 236 lb (107.049 kg)  BMI 33.15 kg/m2  BP Readings from Last 3 Encounters:  01/12/15 118/70  01/04/15 132/82  12/08/14 146/90    Wt Readings from Last 3 Encounters:  01/12/15 236 lb (107.049 kg)  01/04/15 237 lb 9.6 oz (107.775 kg)  12/08/14 243 lb (110.224 kg)    Physical Exam  Constitutional: He is oriented to person, place, and time. He appears well-developed and well-nourished.  Non-toxic appearance. He does not have a sickly appearance. He does not appear ill. No distress.  HENT:  Head: Normocephalic and atraumatic.  Mouth/Throat: Oropharynx is clear and moist. No oropharyngeal exudate.  Eyes: Conjunctivae are normal. Right eye exhibits no discharge. Left eye exhibits no discharge. No scleral icterus.  Neck: Normal range of motion. Neck supple. No JVD present. No tracheal deviation present. No thyromegaly present.  Cardiovascular: Normal rate, regular rhythm  and intact distal pulses.  Exam reveals no gallop and no friction rub.   No murmur heard. Pulmonary/Chest: Effort normal and breath sounds normal. No stridor. No respiratory distress. He has no wheezes. He has no rales. He exhibits no tenderness.  Abdominal: Soft. Bowel  sounds are normal. He exhibits no distension and no mass. There is no tenderness. There is no rebound and no guarding.  Musculoskeletal: Normal range of motion. He exhibits no edema or tenderness.  Lymphadenopathy:    He has no cervical adenopathy.  Neurological: He is oriented to person, place, and time.  Skin: Skin is warm and dry. No rash noted. He is not diaphoretic. No erythema. No pallor.  Vitals reviewed.   Lab Results  Component Value Date   WBC 6.8 04/01/2014   HGB 13.4 04/01/2014   HCT 40.0 04/01/2014   PLT 197.0 04/01/2014   GLUCOSE 131* 04/08/2014   CHOL 187 03/25/2014   TRIG 73 03/25/2014   HDL 48 03/25/2014   LDLCALC 124 03/25/2014   ALT 22 04/08/2014   AST 20 04/08/2014   NA 138 04/08/2014   K 4.0 04/08/2014   CL 107 04/08/2014   CREATININE 1.1 04/08/2014   BUN 9 04/08/2014   CO2 26 04/08/2014   TSH 0.54 03/13/2007   PSA 0.594 03/13/2013   HGBA1C 6.8* 03/25/2014    Dg Chest 2 View  04/01/2014   CLINICAL DATA:  Pneumonia  EXAM: CHEST  2 VIEW  COMPARISON:  03/10/2014  FINDINGS: The heart size and mediastinal contours are within normal limits. Both lungs are clear. The visualized skeletal structures are unremarkable.  IMPRESSION: No active cardiopulmonary disease.   Electronically Signed   By: Maryclare Bean M.D.   On: 04/01/2014 09:02    Assessment & Plan:   Brandon Shannon was seen today for cough.  Diagnoses and all orders for this visit:  Cough Orders: -     DG Chest 2 View; Future -     HYDROcodone-homatropine (HYCODAN) 5-1.5 MG/5ML syrup; Take 5 mLs by mouth every 8 (eight) hours as needed for cough. -     Pulmonary Function Test; Future  Acute bronchitis, unspecified organism Orders: -     HYDROcodone-homatropine (HYCODAN) 5-1.5 MG/5ML syrup; Take 5 mLs by mouth every 8 (eight) hours as needed for cough. -     azithromycin (ZITHROMAX) 500 MG tablet; Take 1 tablet (500 mg total) by mouth daily.   I have discontinued Mr. Brandon Shannon's benzonatate, Guaifenesin, and  amoxicillin-clavulanate. I am also having him start on HYDROcodone-homatropine and azithromycin. Additionally, I am having him maintain his Cholecalciferol, atorvastatin, ALPRAZolam, valsartan, and ipratropium. We will stop administering pneumococcal 23 valent vaccine.  Meds ordered this encounter  Medications  . HYDROcodone-homatropine (HYCODAN) 5-1.5 MG/5ML syrup    Sig: Take 5 mLs by mouth every 8 (eight) hours as needed for cough.    Dispense:  120 mL    Refill:  0  . azithromycin (ZITHROMAX) 500 MG tablet    Sig: Take 1 tablet (500 mg total) by mouth daily.    Dispense:  3 tablet    Refill:  0     Follow-up: Return in about 3 weeks (around 02/02/2015).  Scarlette Calico, MD

## 2015-01-12 NOTE — Progress Notes (Signed)
Pre visit review using our clinic review tool, if applicable. No additional management support is needed unless otherwise documented below in the visit note. 

## 2015-01-12 NOTE — Patient Instructions (Signed)
Cough, Adult  A cough is a reflex that helps clear your throat and airways. It can help heal the body or may be a reaction to an irritated airway. A cough may only last 2 or 3 weeks (acute) or may last more than 8 weeks (chronic).  CAUSES Acute cough:  Viral or bacterial infections. Chronic cough:  Infections.  Allergies.  Asthma.  Post-nasal drip.  Smoking.  Heartburn or acid reflux.  Some medicines.  Chronic lung problems (COPD).  Cancer. SYMPTOMS   Cough.  Fever.  Chest pain.  Increased breathing rate.  High-pitched whistling sound when breathing (wheezing).  Colored mucus that you cough up (sputum). TREATMENT   A bacterial cough may be treated with antibiotic medicine.  A viral cough must run its course and will not respond to antibiotics.  Your caregiver may recommend other treatments if you have a chronic cough. HOME CARE INSTRUCTIONS   Only take over-the-counter or prescription medicines for pain, discomfort, or fever as directed by your caregiver. Use cough suppressants only as directed by your caregiver.  Use a cold steam vaporizer or humidifier in your bedroom or home to help loosen secretions.  Sleep in a semi-upright position if your cough is worse at night.  Rest as needed.  Stop smoking if you smoke. SEEK IMMEDIATE MEDICAL CARE IF:   You have pus in your sputum.  Your cough starts to worsen.  You cannot control your cough with suppressants and are losing sleep.  You begin coughing up blood.  You have difficulty breathing.  You develop pain which is getting worse or is uncontrolled with medicine.  You have a fever. MAKE SURE YOU:   Understand these instructions.  Will watch your condition.  Will get help right away if you are not doing well or get worse. Document Released: 02/10/2011 Document Revised: 11/06/2011 Document Reviewed: 02/10/2011 Exit[employer] Patient Information 2015 [employer]. This information is not intended  to replace advice given to you by your health care provider. Make sure you discuss any questions you have with your health care provider.  

## 2015-03-24 LAB — HM DIABETES EYE EXAM

## 2015-04-01 ENCOUNTER — Encounter

## 2015-04-08 ENCOUNTER — Telehealth: Payer: Self-pay | Admitting: Internal Medicine

## 2015-04-08 NOTE — Telephone Encounter (Signed)
Talked with pulmonary, they advised that the 3 procedure codes used are [id number], [phone], and [id number]--cost is appx $97-$175 billed to insurance co, no co-pay is required at time of test---patient was advised and patient is to contact his insurance co to see what percentage after insurance is billed that he will be responsible for

## 2015-04-08 NOTE — Telephone Encounter (Signed)
Please call patient back in regards  °

## 2015-04-08 NOTE — Telephone Encounter (Signed)
Pt called in and wanted to know what the procedure code is for his pulmonary function test?  He wants to check with ins to see how much it is going to cost for this test?     Best number is [phone]

## 2015-04-08 NOTE — Telephone Encounter (Signed)
Left message for patient to call me back, when patient calls back he needs to talk with tamara

## 2015-06-17 IMAGING — CR DG CHEST 2V
2 series · 2 of 2 positions shown · non-contrast
Comparison: Chest x-ray of 04/01/2014

CLINICAL DATA: Cough, congestion, shortness of breath for 1 month

EXAM:
CHEST  2 VIEW

[view not recorded (1 of 2)]
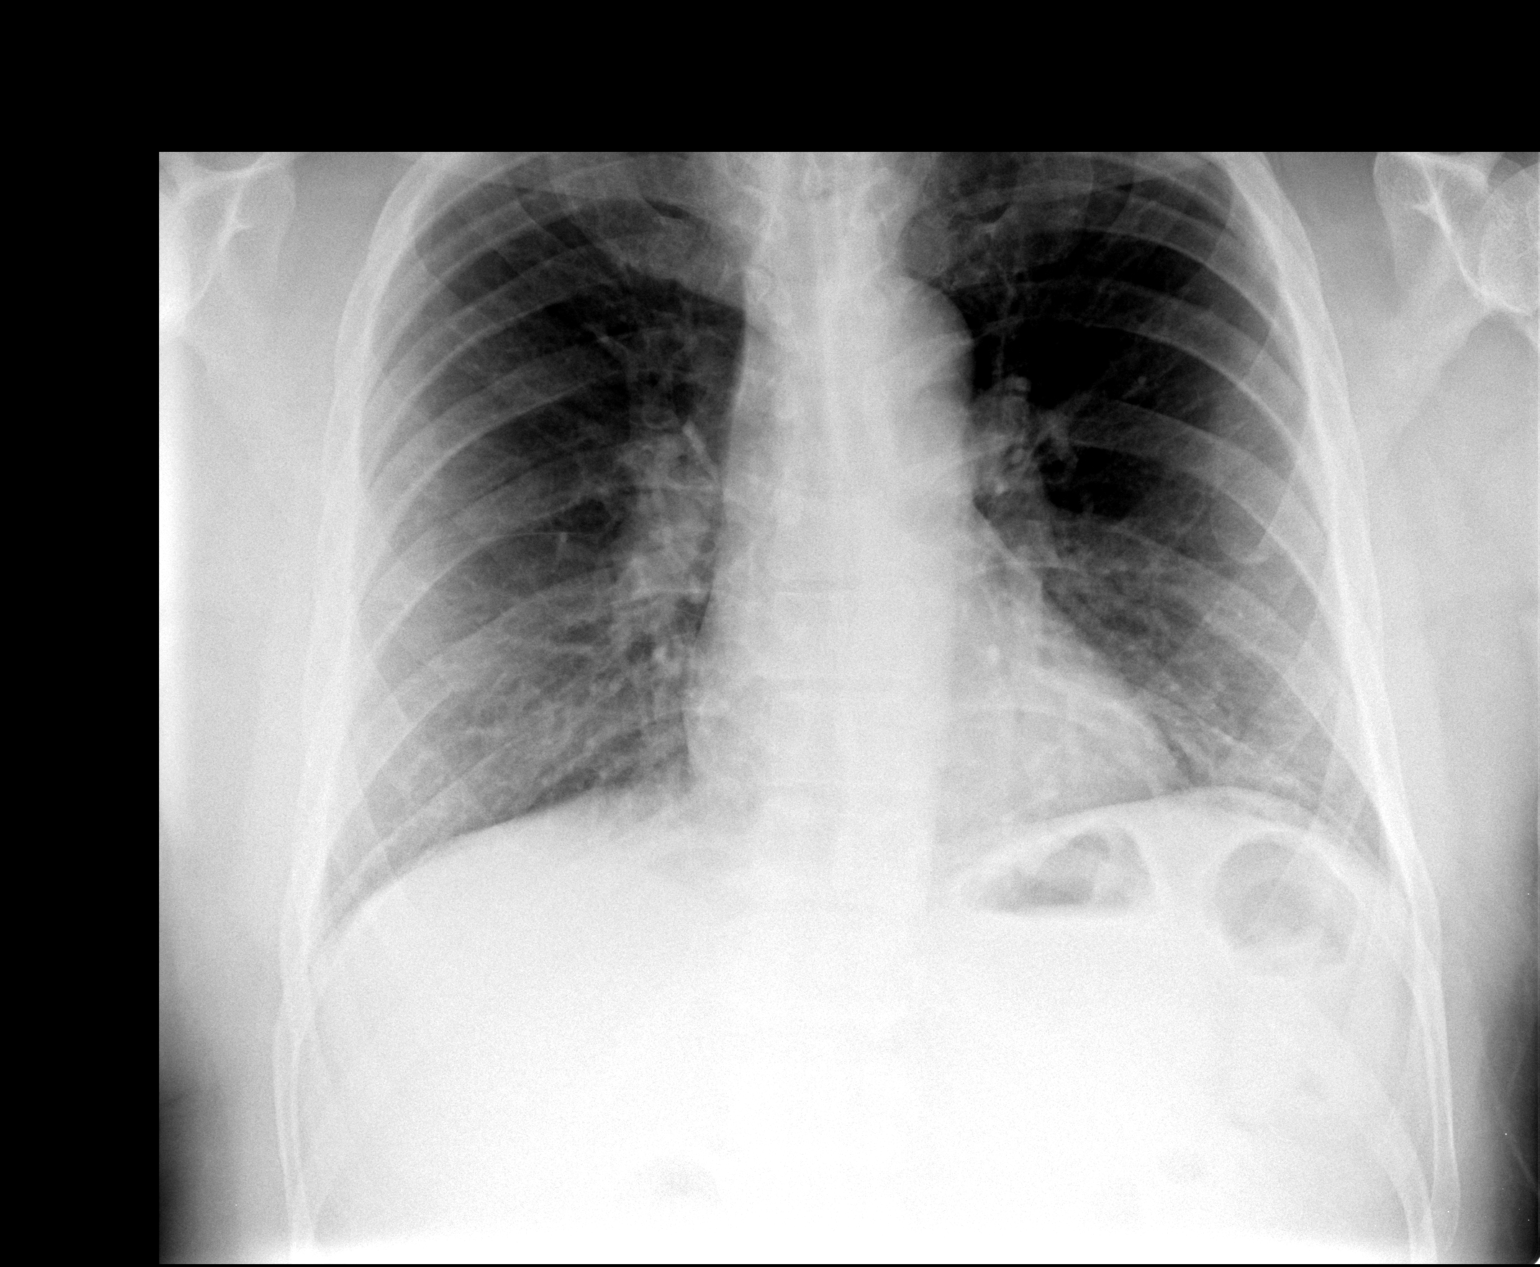

[view not recorded (2 of 2)]
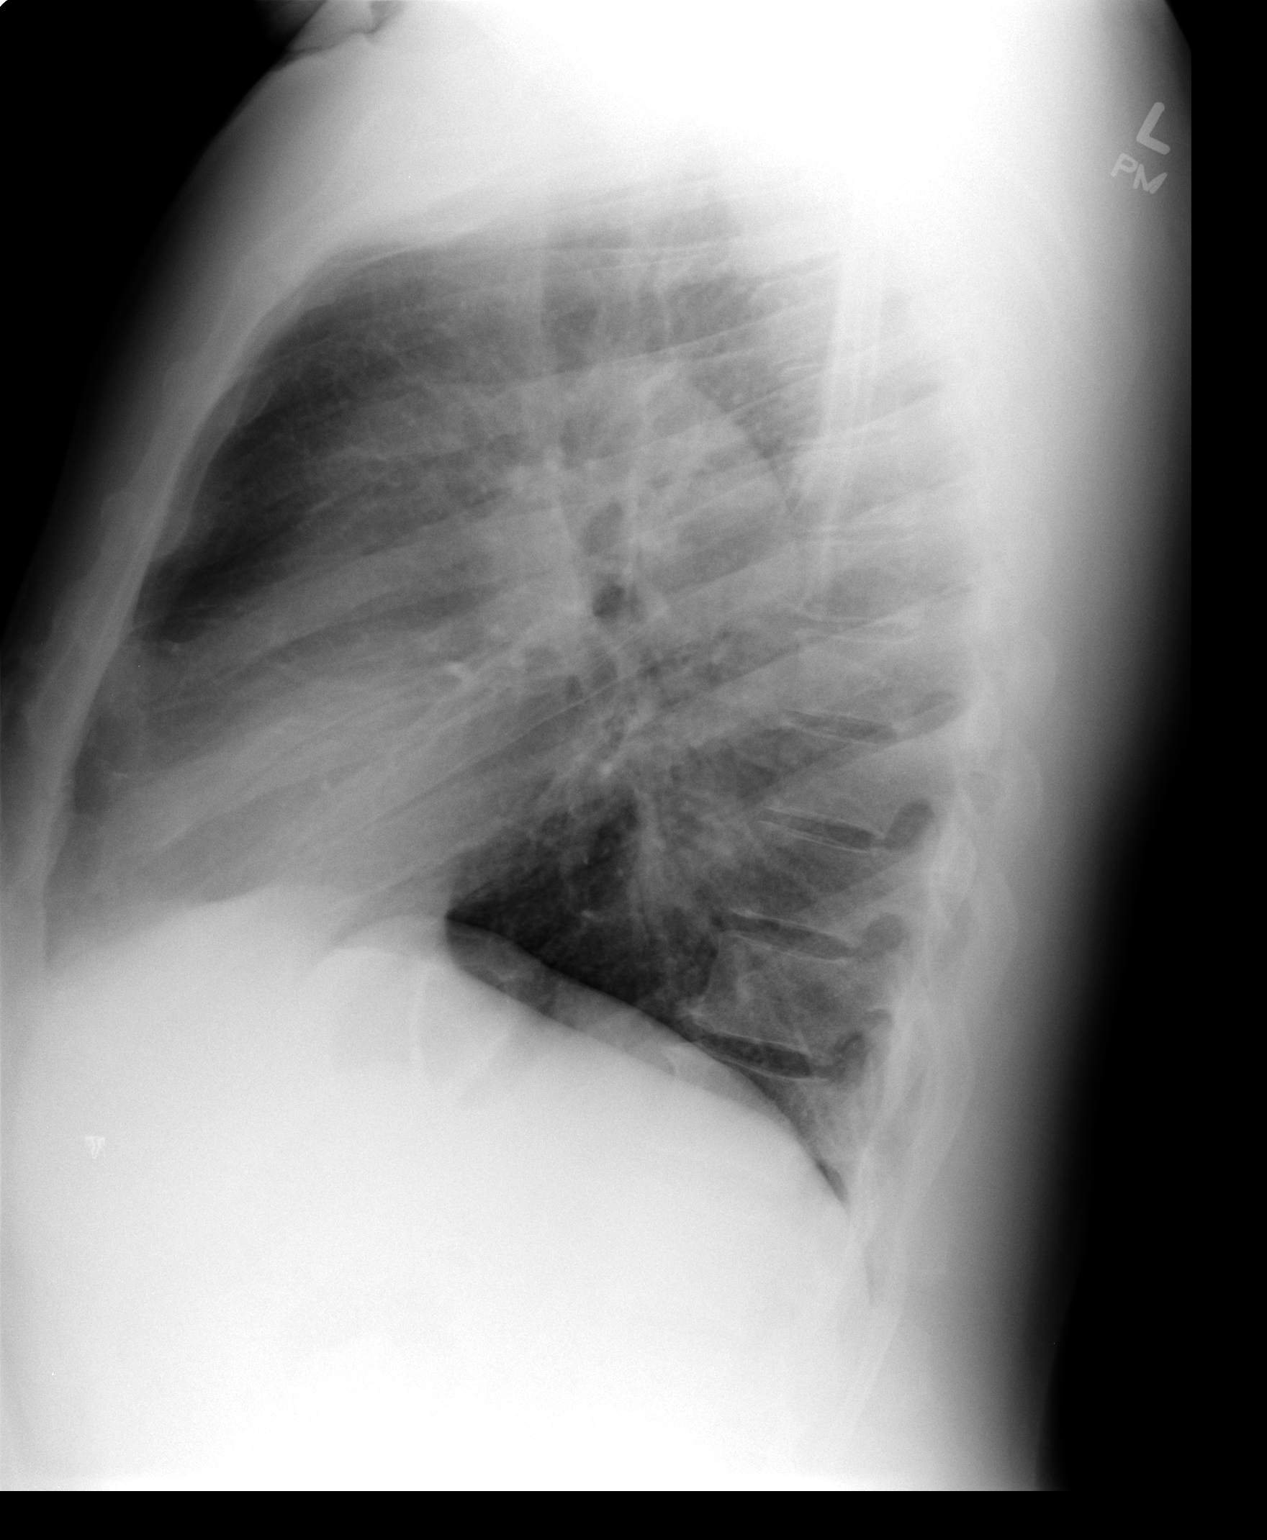

[2 of 2 positions shown; findings below may reference images not displayed]

FINDINGS: No active infiltrate or effusion is seen. Mediastinal and hilar
contours are unchanged. The heart is within normal limits in size.
No bony abnormality is seen.
IMPRESSION: No active cardiopulmonary disease.

## 2015-09-01 ENCOUNTER — Ambulatory Visit: Admitting: Internal Medicine

## 2015-09-16 ENCOUNTER — Ambulatory Visit (INDEPENDENT_AMBULATORY_CARE_PROVIDER_SITE_OTHER): Admitting: Internal Medicine

## 2015-09-16 ENCOUNTER — Encounter: Payer: Self-pay | Admitting: Internal Medicine

## 2015-09-16 ENCOUNTER — Telehealth: Payer: Self-pay | Admitting: *Deleted

## 2015-09-16 ENCOUNTER — Other Ambulatory Visit (INDEPENDENT_AMBULATORY_CARE_PROVIDER_SITE_OTHER)

## 2015-09-16 ENCOUNTER — Other Ambulatory Visit: Payer: Self-pay | Admitting: Internal Medicine

## 2015-09-16 VITALS — BP 130/96 | HR 69 | Temp 97.6°F | Resp 16 | Ht 71.0 in | Wt 240.0 lb

## 2015-09-16 DIAGNOSIS — E785 Hyperlipidemia, unspecified: Secondary | ICD-10-CM | POA: Diagnosis not present

## 2015-09-16 DIAGNOSIS — I10 Essential (primary) hypertension: Secondary | ICD-10-CM | POA: Diagnosis not present

## 2015-09-16 DIAGNOSIS — R778 Other specified abnormalities of plasma proteins: Secondary | ICD-10-CM

## 2015-09-16 DIAGNOSIS — E118 Type 2 diabetes mellitus with unspecified complications: Secondary | ICD-10-CM

## 2015-09-16 DIAGNOSIS — R202 Paresthesia of skin: Secondary | ICD-10-CM

## 2015-09-16 LAB — CBC WITH DIFFERENTIAL/PLATELET
BASOS ABS: 0 10*3/uL (ref 0.0–0.1)
BASOS PCT: 0.4 % (ref 0.0–3.0)
Eosinophils Absolute: 0.3 10*3/uL (ref 0.0–0.7)
Eosinophils Relative: 3.4 % (ref 0.0–5.0)
HCT: 48.3 % (ref 39.0–52.0)
Hemoglobin: 15.9 g/dL (ref 13.0–17.0)
LYMPHS ABS: 3.5 10*3/uL (ref 0.7–4.0)
Lymphocytes Relative: 44.3 % (ref 12.0–46.0)
MCHC: 32.8 g/dL (ref 30.0–36.0)
MCV: 91.7 fl (ref 78.0–100.0)
MONOS PCT: 6.3 % (ref 3.0–12.0)
Monocytes Absolute: 0.5 10*3/uL (ref 0.1–1.0)
NEUTROS ABS: 3.7 10*3/uL (ref 1.4–7.7)
NEUTROS PCT: 45.6 % (ref 43.0–77.0)
PLATELETS: 190 10*3/uL (ref 150.0–400.0)
RBC: 5.27 Mil/uL (ref 4.22–5.81)
RDW: 13.7 % (ref 11.5–15.5)
WBC: 8 10*3/uL (ref 4.0–10.5)

## 2015-09-16 LAB — URINALYSIS, ROUTINE W REFLEX MICROSCOPIC
Bilirubin Urine: NEGATIVE
Hgb urine dipstick: NEGATIVE
KETONES UR: NEGATIVE
Leukocytes, UA: NEGATIVE
Nitrite: NEGATIVE
PH: 6.5 (ref 5.0–8.0)
RBC / HPF: NONE SEEN (ref 0–?)
SPECIFIC GRAVITY, URINE: 1.01 (ref 1.000–1.030)
Total Protein, Urine: NEGATIVE
Urine Glucose: NEGATIVE
Urobilinogen, UA: 0.2 (ref 0.0–1.0)
WBC UA: NONE SEEN (ref 0–?)

## 2015-09-16 LAB — COMPREHENSIVE METABOLIC PANEL
ALT: 19 U/L (ref 0–53)
AST: 19 U/L (ref 0–37)
Albumin: 4.2 g/dL (ref 3.5–5.2)
Alkaline Phosphatase: 115 U/L (ref 39–117)
BILIRUBIN TOTAL: 0.6 mg/dL (ref 0.2–1.2)
BUN: 12 mg/dL (ref 6–23)
CHLORIDE: 104 meq/L (ref 96–112)
CO2: 29 meq/L (ref 19–32)
CREATININE: 1.2 mg/dL (ref 0.40–1.50)
Calcium: 9.7 mg/dL (ref 8.4–10.5)
GFR: 79.01 mL/min (ref >60.00)
GLUCOSE: 132 mg/dL — AB (ref 70–99)
Potassium: 4.9 mEq/L (ref 3.5–5.1)
Sodium: 140 mEq/L (ref 135–145)
Total Protein: 7.7 g/dL (ref 6.0–8.3)

## 2015-09-16 LAB — MICROALBUMIN / CREATININE URINE RATIO
CREATININE, U: 173.1 mg/dL
MICROALB/CREAT RATIO: 0.4 mg/g (ref 0.0–30.0)

## 2015-09-16 LAB — LIPID PANEL
CHOL/HDL RATIO: 4
Cholesterol: 174 mg/dL (ref 0–200)
HDL: 48.1 mg/dL (ref >39.00)
LDL CALC: 111 mg/dL — AB (ref 0–99)
NONHDL: 126.26
Triglycerides: 75 mg/dL (ref 0.0–149.0)
VLDL: 15 mg/dL (ref 0.0–40.0)

## 2015-09-16 LAB — TSH: TSH: 0.96 u[IU]/mL (ref 0.35–4.50)

## 2015-09-16 LAB — HEMOGLOBIN A1C: Hgb A1c MFr Bld: 7.5 % — ABNORMAL HIGH (ref 4.6–6.5)

## 2015-09-16 MED ORDER — ROSUVASTATIN CALCIUM 20 MG PO TABS: 20.0000 mg | ORAL_TABLET | Freq: Every day | ORAL | Status: DC

## 2015-09-16 MED ORDER — ASPIRIN EC 81 MG PO TBEC: 81.0000 mg | DELAYED_RELEASE_TABLET | Freq: Every day | ORAL | Status: DC

## 2015-09-16 MED ORDER — AZILSARTAN MEDOXOMIL 40 MG PO TABS: 1.0000 | ORAL_TABLET | Freq: Every day | ORAL | Status: DC

## 2015-09-16 MED ORDER — METFORMIN HCL ER 750 MG PO TB24: 1500.0000 mg | ORAL_TABLET | Freq: Every day | ORAL | Status: DC

## 2015-09-16 NOTE — Progress Notes (Signed)
Pre visit review using our clinic review tool, if applicable. No additional management support is needed unless otherwise documented below in the visit note. 

## 2015-09-16 NOTE — Telephone Encounter (Signed)
Received PA form has been completed and fax back to [facility] waiting on approval status...Johny Chess

## 2015-09-16 NOTE — Progress Notes (Signed)
Subjective:  Patient ID: Brandon Shannon, male    DOB: November 15, 1955  Age: 60 y.o. MRN: [id number]  CC: Hypertension; Diabetes; and Hyperlipidemia   HPI Brandon Shannon presents for follow up, he does not like diovan and atorvastatin, there have not been any specific side effects. He complains of tingling and burning in his feet and is concerned he may have diabetic nerve damage.  Outpatient Prescriptions Prior to Visit  Medication Sig Dispense Refill  . atorvastatin (LIPITOR) 80 MG tablet TAKE 1 TABLET EVERY DAY 90 tablet 3  . valsartan (DIOVAN) 160 MG tablet Take 160 mg by mouth daily.    Marland Kitchen ALPRAZolam (XANAX) 0.25 MG tablet Take 1 tablet (0.25 mg total) by mouth 2 (two) times daily as needed for anxiety. (Patient not taking: Reported on 01/12/2015) 30 tablet 1  . azithromycin (ZITHROMAX) 500 MG tablet Take 1 tablet (500 mg total) by mouth daily. 3 tablet 0  . Cholecalciferol 50000 UNITS TABS Take 1 tablet by mouth once a week. 12 tablet 3  . HYDROcodone-homatropine (HYCODAN) 5-1.5 MG/5ML syrup Take 5 mLs by mouth every 8 (eight) hours as needed for cough. 120 mL 0  . ipratropium (ATROVENT) 0.03 % nasal spray Place 2 sprays into both nostrils 2 (two) times daily. 30 mL 0   No facility-administered medications prior to visit.    ROS Review of Systems  Constitutional: Positive for unexpected weight change. Negative for fever, chills, diaphoresis, appetite change and fatigue.  HENT: Negative.   Eyes: Negative.   Respiratory: Negative.  Negative for cough, choking, chest tightness, shortness of breath and stridor.   Cardiovascular: Negative.  Negative for chest pain, palpitations and leg swelling.  Gastrointestinal: Negative.  Negative for nausea, vomiting, abdominal pain, diarrhea and constipation.  Endocrine: Negative.  Negative for polydipsia, polyphagia and polyuria.  Genitourinary: Negative.  Negative for difficulty urinating.  Musculoskeletal: Negative.  Negative for myalgias, back  pain and arthralgias.  Skin: Negative.  Negative for color change and rash.  Allergic/Immunologic: Negative.   Neurological: Negative.  Negative for dizziness.  Hematological: Negative.  Negative for adenopathy. Does not bruise/bleed easily.  Psychiatric/Behavioral: Negative.     Objective:  BP 130/96 mmHg  Pulse 69  Temp(Src) 97.6 F (36.4 C) (Oral)  Resp 16  Ht 5\' 11"  (1.803 m)  Wt 240 lb (108.863 kg)  BMI 33.49 kg/m2  SpO2 99%  BP Readings from Last 3 Encounters:  09/16/15 130/96  01/12/15 118/70  01/04/15 132/82    Wt Readings from Last 3 Encounters:  09/16/15 240 lb (108.863 kg)  01/12/15 236 lb (107.049 kg)  01/04/15 237 lb 9.6 oz (107.775 kg)    Physical Exam  Constitutional: He is oriented to person, place, and time. No distress.  HENT:  Mouth/Throat: Oropharynx is clear and moist. No oropharyngeal exudate.  Eyes: Conjunctivae are normal. Right eye exhibits no discharge. Left eye exhibits no discharge. No scleral icterus.  Neck: Normal range of motion. Neck supple. No JVD present. No tracheal deviation present. No thyromegaly present.  Cardiovascular: Normal rate, regular rhythm and intact distal pulses.  Exam reveals no gallop and no friction rub.   No murmur heard. Pulmonary/Chest: Effort normal and breath sounds normal. No stridor. No respiratory distress. He has no wheezes. He has no rales. He exhibits no tenderness.  Abdominal: Soft. Bowel sounds are normal. He exhibits no distension and no mass. There is no tenderness. There is no rebound and no guarding.  Musculoskeletal: Normal range of motion. He exhibits no  edema or tenderness.  Lymphadenopathy:    He has no cervical adenopathy.  Neurological: He is oriented to person, place, and time.  Skin: Skin is warm and dry. No rash noted. He is not diaphoretic. No erythema. No pallor.  Vitals reviewed.   Lab Results  Component Value Date   WBC 8.0 09/16/2015   HGB 15.9 09/16/2015   HCT 48.3 09/16/2015    PLT 190.0 09/16/2015   GLUCOSE 132* 09/16/2015   CHOL 174 09/16/2015   TRIG 75.0 09/16/2015   HDL 48.10 09/16/2015   LDLCALC 111* 09/16/2015   ALT 19 09/16/2015   AST 19 09/16/2015   NA 140 09/16/2015   K 4.9 09/16/2015   CL 104 09/16/2015   CREATININE 1.20 09/16/2015   BUN 12 09/16/2015   CO2 29 09/16/2015   TSH 0.96 09/16/2015   PSA 0.594 03/13/2013   HGBA1C 7.5* 09/16/2015   MICROALBUR <0.7 09/16/2015    Dg Chest 2 View  01/12/2015  CLINICAL DATA:  Cough, congestion, shortness of breath for 1 month EXAM: CHEST  2 VIEW COMPARISON:  Chest x-ray of 04/01/2014 FINDINGS: No active infiltrate or effusion is seen. Mediastinal and hilar contours are unchanged. The heart is within normal limits in size. No bony abnormality is seen. IMPRESSION: No active cardiopulmonary disease. Electronically Signed   By: Ivar Drape M.D.   On: 01/12/2015 15:05    Assessment & Plan:   Edger was seen today for hypertension, diabetes and hyperlipidemia.  Diagnoses and all orders for this visit:  Elevated total protein -     Comprehensive metabolic panel; Future -     CBC with Differential/Platelet; Future -     SPEP & IFE with QIG; Future  Essential hypertension- his blood pressure is not adequately well controlled, there is no evidence of secondary causes or end organ damage, will change into a different ARB. -     Comprehensive metabolic panel; Future -     TSH; Future -     Urinalysis, Routine w reflex microscopic (not at [facility]); Future -     Azilsartan Medoxomil (EDARBI) 40 MG TABS; Take 1 tablet by mouth daily.  Hyperlipidemia with target LDL less than 100- he has not achieved his LDL goal, will try different statin. -     Lipid panel; Future -     Comprehensive metabolic panel; Future -     TSH; Future -     rosuvastatin (CRESTOR) 20 MG tablet; Take 1 tablet (20 mg total) by mouth daily.  Type 2 diabetes mellitus with complication, without long-term current use of insulin (HCC)- his A1c  is up to 7.5%, will start treating this with metformin, will screen for diabetic neuropathy with a nerve conduction study and EMG. -     Lipid panel; Future -     Comprehensive metabolic panel; Future -     Hemoglobin A1c; Future -     Microalbumin / creatinine urine ratio; Future -     Azilsartan Medoxomil (EDARBI) 40 MG TABS; Take 1 tablet by mouth daily. -     rosuvastatin (CRESTOR) 20 MG tablet; Take 1 tablet (20 mg total) by mouth daily. -     aspirin EC 81 MG tablet; Take 1 tablet (81 mg total) by mouth daily. -     Ambulatory referral to Neurology -     metFORMIN (GLUCOPHAGE XR) 750 MG 24 hr tablet; Take 2 tablets (1,500 mg total) by mouth daily with breakfast.  Paresthesia of both feet- will  schedule a NCS and EMG -     Ambulatory referral to Neurology   I have discontinued Mr. Minten Cholecalciferol, atorvastatin, ALPRAZolam, valsartan, ipratropium, HYDROcodone-homatropine, and azithromycin. I am also having him start on Azilsartan Medoxomil, rosuvastatin, aspirin EC, and metFORMIN.  Meds ordered this encounter  Medications  . Azilsartan Medoxomil (EDARBI) 40 MG TABS    Sig: Take 1 tablet by mouth daily.    Dispense:  30 tablet    Refill:  11  . rosuvastatin (CRESTOR) 20 MG tablet    Sig: Take 1 tablet (20 mg total) by mouth daily.    Dispense:  30 tablet    Refill:  11  . aspirin EC 81 MG tablet    Sig: Take 1 tablet (81 mg total) by mouth daily.    Dispense:  90 tablet    Refill:  3  . metFORMIN (GLUCOPHAGE XR) 750 MG 24 hr tablet    Sig: Take 2 tablets (1,500 mg total) by mouth daily with breakfast.    Dispense:  180 tablet    Refill:  1     Follow-up: Return in about 4 months (around 01/14/2016).  Scarlette Calico, MD

## 2015-09-16 NOTE — Telephone Encounter (Signed)
Pharmacy left msg on triage stating pt is needing PA on Crestor. Pls call [phone]. Called [employer] spoke with rep gave pt info. They will faxed PA over to be completed. Case ID # [id number]....Johny Chess

## 2015-09-16 NOTE — Patient Instructions (Signed)

## 2015-09-21 LAB — PROTEIN ELECTROPHORESIS, SERUM
Albumin ELP: 4 g/dL (ref 3.8–4.8)
Alpha-1-Globulin: 0.4 g/dL — ABNORMAL HIGH (ref 0.2–0.3)
Alpha-2-Globulin: 0.6 g/dL (ref 0.5–0.9)
Beta 2: 0.5 g/dL (ref 0.2–0.5)
Beta Globulin: 0.5 g/dL (ref 0.4–0.6)
GAMMA GLOBULIN: 1.3 g/dL (ref 0.8–1.7)
TOTAL PROTEIN, SERUM ELECTROPHOR: 7.3 g/dL (ref 6.1–8.1)

## 2015-09-21 LAB — IMMUNOFIX ELECTROPH
IGA: 498 mg/dL — AB (ref 68–379)
IGM, SERUM: 170 mg/dL (ref 41–251)
IgG (Immunoglobin G), Serum: 1490 mg/dL (ref 650–1600)

## 2015-09-22 ENCOUNTER — Telehealth: Payer: Self-pay

## 2015-09-22 NOTE — Telephone Encounter (Signed)
Called [employer] to verify if PA was approved she stated  PA is not applicable for this pt. No PA is further needed...Brandon Shannon

## 2015-09-22 NOTE — Telephone Encounter (Signed)
Quinntin Miedema (Key: [id number])   Approved. Pharmacy notified

## 2015-09-23 ENCOUNTER — Other Ambulatory Visit: Payer: Self-pay | Admitting: *Deleted

## 2015-09-23 DIAGNOSIS — R202 Paresthesia of skin: Secondary | ICD-10-CM

## 2015-09-28 ENCOUNTER — Ambulatory Visit (INDEPENDENT_AMBULATORY_CARE_PROVIDER_SITE_OTHER): Admitting: Neurology

## 2015-09-28 DIAGNOSIS — R202 Paresthesia of skin: Secondary | ICD-10-CM | POA: Diagnosis not present

## 2015-09-28 DIAGNOSIS — M5417 Radiculopathy, lumbosacral region: Secondary | ICD-10-CM

## 2015-09-28 NOTE — Procedures (Signed)
[facility]  [address], [address]  [place], [place] [address] Tel: [phone] Fax:  [phone] Test Date:  09/28/2015  Patient: Brandon Shannon DOB: 11/14/1955 Physician: Narda Amber, DO  Sex: Male Height: 5\' 10"  Ref Phys: Scarlette Calico, M.D.  ID#: [id number] Temp: 33.1C Technician: Jerilynn Mages. Dean   Patient Complaints: This is a 60 year old gentleman referred for evaluation of bilateral feet numbness.  NCV & EMG Findings: Extensive electrodiagnostic testing of the left lower extremity and additional studies of the right shows: 1. Bilateral sural and superficial peroneal sensory responses are within normal limits. 2. Bilateral peroneal and tibial motor responses are within normal limits. 3. Bilateral H reflex studies are absent and is of unclear clinical significance given no evidence of an S1 radiculopathy or sensorimotor polyneuropathy. 4. Chronic motor axon loss changes are seen affecting L5 myotomes bilaterally, without accompanied active denervation.  Impression: 1. Chronic L5 radiculopathy affecting bilateral lower extremities, mild in degree electrically. 2. There is no evidence of a large fiber sensorimotor polyneuropathy affecting the lower extremities. However, small fiber neuropathy cannot be excluded by this study.   ___________________________ Narda Amber, DO    Nerve Conduction Studies Anti Sensory Summary Table   Site NR Peak (ms) Norm Peak (ms) P-T Amp (V) Norm P-T Amp  Left Sup Peroneal Anti Sensory (Ant Lat Mall)  33.1C  12 cm    3.1 <4.6 6.7 >3  Right Sup Peroneal Anti Sensory (Ant Lat Mall)  33.1C  12 cm    4.5 <4.6 6.6 >3  Left Sural Anti Sensory (Lat Mall)  33.1C    Long distance used  Calf    5.5 <4.6 3.9 >3  Right Sural Anti Sensory (Lat Mall)  33.1C    Long distance used  Calf    5.1 <4.6 4.2 >3   Motor Summary Table   Site NR Onset (ms) Norm Onset (ms) O-P Amp (mV) Norm O-P Amp Site1 Site2 Delta-0 (ms) Dist (cm) Vel (m/s) Norm Vel  (m/s)  Left Peroneal Motor (Ext Dig Brev)  33.1C  Ankle    4.7 <6.0 4.5 >2.5 B Fib Ankle 8.7 38.0 44 >40  B Fib    13.4  4.3  Poplt B Fib 2.1 10.0 48 >40  Poplt    15.5  4.2         Right Peroneal Motor (Ext Dig Brev)  Ankle    4.3 <6.0 6.9 >2.5 B Fib Ankle 8.8 39.0 44 >40  B Fib    13.1  6.6  Poplt B Fib 1.9 10.0 53 >40  Poplt    15.0  6.5         Left Tibial Motor (Abd Hall Brev)  33.1C  Ankle    5.0 <6.0 4.8 >4 Knee Ankle 9.5 44.0 46 >40  Knee    14.5  2.7         Right Tibial Motor (Abd Hall Brev)  Ankle    4.5 <6.0 4.4 >4 Knee Ankle 10.3 43.0 42 >40  Knee    14.8  2.5          H Reflex Studies   NR H-Lat (ms) Lat Norm (ms) L-R H-Lat (ms)  Left Tibial (Gastroc)  33.1C  NR  <35   Right Tibial (Gastroc)  NR  <35    EMG   Side Muscle Ins Act Fibs Psw Fasc Number Recrt Dur Dur. Amp Amp. Poly Poly. Comment  Right GluteusMed Nml Nml Nml Nml 1- Rapid Some 1+ Some  1+ Nml Nml N/A  Right AntTibialis Nml Nml Nml Nml 1- Rapid Some 1+ Some 1+ Nml Nml N/A  Right Gastroc Nml Nml Nml Nml Nml Nml Nml Nml Nml Nml Nml Nml N/A  Right RectFemoris Nml Nml Nml Nml Nml Nml Nml Nml Nml Nml Nml Nml N/A  Left AntTibialis Nml Nml Nml Nml 1- Mod-R Few 1+ Few 1+ Nml Nml N/A  Left Flex Dig Long Nml Nml Nml Nml 2- Mod-R Few 1+ Few 1+ Nml Nml N/A  Left RectFemoris Nml Nml Nml Nml Nml Nml Nml Nml Nml Nml Nml Nml N/A  Left GluteusMed Nml Nml Nml Nml 1- Rapid Some 1+ Some 1+ Nml Nml N/A  Left BicepsFemS Nml Nml Nml Nml Nml Nml Nml Nml Nml Nml Nml Nml N/A      Waveforms:

## 2015-09-29 ENCOUNTER — Encounter: Payer: Self-pay | Admitting: Internal Medicine

## 2015-10-19 ENCOUNTER — Other Ambulatory Visit: Payer: Self-pay | Admitting: *Deleted

## 2015-10-19 DIAGNOSIS — E785 Hyperlipidemia, unspecified: Secondary | ICD-10-CM

## 2015-10-19 DIAGNOSIS — E118 Type 2 diabetes mellitus with unspecified complications: Secondary | ICD-10-CM

## 2015-10-19 DIAGNOSIS — I10 Essential (primary) hypertension: Secondary | ICD-10-CM

## 2015-10-19 MED ORDER — AZILSARTAN MEDOXOMIL 40 MG PO TABS: 1.0000 | ORAL_TABLET | Freq: Every day | ORAL | Status: DC

## 2015-10-19 MED ORDER — ROSUVASTATIN CALCIUM 20 MG PO TABS: 20.0000 mg | ORAL_TABLET | Freq: Every day | ORAL | Status: DC

## 2015-10-19 NOTE — Telephone Encounter (Signed)
Received call from Margarita Grizzle w/[employer] [facility] stating fax over request to have pt Brandon Shannon & crestor fill have'nt receive back. Gave verbal ok to fill updated epic...Johny Chess

## 2015-11-05 ENCOUNTER — Ambulatory Visit (INDEPENDENT_AMBULATORY_CARE_PROVIDER_SITE_OTHER): Admitting: Physician Assistant

## 2015-11-05 VITALS — BP 123/85 | HR 88 | Temp 98.2°F | Resp 16 | Ht 71.0 in | Wt 238.0 lb

## 2015-11-05 DIAGNOSIS — K6289 Other specified diseases of anus and rectum: Secondary | ICD-10-CM | POA: Diagnosis not present

## 2015-11-05 MED ORDER — PSYLLIUM 58.6 % PO POWD: 1.0000 | Freq: Three times a day (TID) | ORAL | Status: DC

## 2015-11-05 MED ORDER — HYDROCORTISONE ACETATE 25 MG RE SUPP: 25.0000 mg | Freq: Two times a day (BID) | RECTAL | Status: DC

## 2015-11-05 NOTE — Patient Instructions (Signed)
     IF you received an x-ray today, you will receive an invoice from [facility]. Please contact  Radiology at [phone] with questions or concerns regarding your invoice.   IF you received labwork today, you will receive an invoice from [facility]. Please contact [facility] at [phone] with questions or concerns regarding your invoice.   Our billing staff will not be able to assist you with questions regarding bills from these companies.  You will be contacted with the lab results as soon as they are available. The fastest way to get your results is to activate your My Chart account. Instructions are located on the last page of this paperwork. If you have not heard from us regarding the results in 2 weeks, please contact this office.      

## 2015-11-05 NOTE — Progress Notes (Signed)
   11/05/2015 4:35 PM   DOB: October 19, 1955 / MRN: [id number]  SUBJECTIVE:  Brandon Shannon is a 60 y.o. male presenting for a rash around rectum.  This has been present for roughly 1 year.  He has colonoscopy since this began and nothing was found with regard to this problem. He reports a history of constipation and takes stool softeners often.    He has a history of diabetes and last A1c is 7.5 roughly 2 months ago.    He has No Known Allergies.   He  has a past medical history of Hyperlipidemia; Hypertension; Diabetes mellitus without complication ([place]); Allergy; and GERD (gastroesophageal reflux disease).    He  reports that he has never smoked. He has never used smokeless tobacco. He reports that he drinks alcohol. He reports that he does not use illicit drugs. He  reports that he currently engages in sexual activity. The patient  has past surgical history that includes Foot surgery and Colonoscopy (10-27-2004).  His family history includes Arthritis in his mother; Diabetes in his mother; Heart disease in his mother; Hypertension in his mother, sister, and sister. There is no history of Cancer, Alcohol abuse, Drug abuse, Early death, Kidney disease, Stroke, or Colon cancer.  Review of Systems  Constitutional: Negative for fever.  Gastrointestinal: Positive for constipation. Negative for abdominal pain, diarrhea and blood in stool.  Skin: Positive for itching and rash.  Neurological: Negative for tingling and headaches.    Problem list and medications reviewed and updated by myself where necessary, and exist elsewhere in the encounter.   OBJECTIVE:  BP 123/85 mmHg  Pulse 88  Temp(Src) 98.2 F (36.8 C) (Oral)  Resp 16  Ht 5\' 11"  (1.803 m)  Wt 238 lb (107.956 kg)  BMI 33.21 kg/m2  SpO2 99%  Physical Exam  Constitutional: He is oriented to person, place, and time. He appears well-developed. He does not appear ill.  Eyes: Conjunctivae and EOM are normal. Pupils are equal, round, and  reactive to light.  Cardiovascular: Normal rate.   Pulmonary/Chest: Effort normal.  Abdominal: He exhibits no distension.  Genitourinary:     Musculoskeletal: Normal range of motion.  Neurological: He is alert and oriented to person, place, and time. No cranial nerve deficit. Coordination normal.  Skin: Skin is warm and dry. He is not diaphoretic.  Psychiatric: He has a normal mood and affect.  Nursing note and vitals reviewed.   No results found for this or any previous visit (from the past 72 hour(s)).  No results found.  ASSESSMENT AND PLAN  Brandon Shannon was seen today for rash.  Diagnoses and all orders for this visit:  Rectal irritation: It appears that his irritation may be due to friction.  Will try a rectal steroid and start him on psyllium.  He will contact me in 5 days if the plan is not working.   -     hydrocortisone (ANUSOL-HC) 25 MG suppository; Place 1 suppository (25 mg total) rectally 2 (two) times daily. -     psyllium (METAMUCIL SMOOTH TEXTURE) 58.6 % powder; Take 1 packet by mouth 3 (three) times daily.    The patient was advised to call or return to clinic if he does not see an improvement in symptoms or to seek the care of the closest emergency department if he worsens with the above plan.   Philis Fendt, MHS, PA-C [facility] 11/05/2015 4:35 PM

## 2015-12-27 ENCOUNTER — Telehealth: Payer: Self-pay

## 2015-12-27 NOTE — Telephone Encounter (Signed)
Ruby states her husband is in need of his records. Please call [phone] and pt was told it could take up to 72 hrs.

## 2016-01-17 ENCOUNTER — Encounter: Payer: Self-pay | Admitting: Internal Medicine

## 2016-01-17 ENCOUNTER — Ambulatory Visit (INDEPENDENT_AMBULATORY_CARE_PROVIDER_SITE_OTHER): Admitting: Internal Medicine

## 2016-01-17 ENCOUNTER — Other Ambulatory Visit (INDEPENDENT_AMBULATORY_CARE_PROVIDER_SITE_OTHER)

## 2016-01-17 VITALS — BP 120/82 | HR 72 | Temp 98.0°F | Ht 71.0 in | Wt 234.0 lb

## 2016-01-17 DIAGNOSIS — R202 Paresthesia of skin: Secondary | ICD-10-CM

## 2016-01-17 DIAGNOSIS — R0609 Other forms of dyspnea: Secondary | ICD-10-CM | POA: Diagnosis not present

## 2016-01-17 DIAGNOSIS — I10 Essential (primary) hypertension: Secondary | ICD-10-CM

## 2016-01-17 DIAGNOSIS — R778 Other specified abnormalities of plasma proteins: Secondary | ICD-10-CM

## 2016-01-17 DIAGNOSIS — R0600 Dyspnea, unspecified: Secondary | ICD-10-CM | POA: Insufficient documentation

## 2016-01-17 DIAGNOSIS — E118 Type 2 diabetes mellitus with unspecified complications: Secondary | ICD-10-CM

## 2016-01-17 LAB — CBC WITH DIFFERENTIAL/PLATELET
BASOS ABS: 0 10*3/uL (ref 0.0–0.1)
Basophils Relative: 0.5 % (ref 0.0–3.0)
EOS ABS: 0.3 10*3/uL (ref 0.0–0.7)
Eosinophils Relative: 3.3 % (ref 0.0–5.0)
HEMATOCRIT: 42.6 % (ref 39.0–52.0)
HEMOGLOBIN: 14.5 g/dL (ref 13.0–17.0)
LYMPHS PCT: 31.7 % (ref 12.0–46.0)
Lymphs Abs: 2.7 10*3/uL (ref 0.7–4.0)
MCHC: 34 g/dL (ref 30.0–36.0)
MCV: 90.1 fl (ref 78.0–100.0)
MONOS PCT: 8 % (ref 3.0–12.0)
Monocytes Absolute: 0.7 10*3/uL (ref 0.1–1.0)
Neutro Abs: 4.8 10*3/uL (ref 1.4–7.7)
Neutrophils Relative %: 56.5 % (ref 43.0–77.0)
PLATELETS: 172 10*3/uL (ref 150.0–400.0)
RBC: 4.73 Mil/uL (ref 4.22–5.81)
RDW: 14.2 % (ref 11.5–15.5)
WBC: 8.6 10*3/uL (ref 4.0–10.5)

## 2016-01-17 LAB — COMPREHENSIVE METABOLIC PANEL
ALT: 22 U/L (ref 0–53)
AST: 19 U/L (ref 0–37)
Albumin: 4.2 g/dL (ref 3.5–5.2)
Alkaline Phosphatase: 77 U/L (ref 39–117)
BILIRUBIN TOTAL: 0.6 mg/dL (ref 0.2–1.2)
BUN: 14 mg/dL (ref 6–23)
CHLORIDE: 106 meq/L (ref 96–112)
CO2: 29 meq/L (ref 19–32)
CREATININE: 1.09 mg/dL (ref 0.40–1.50)
Calcium: 9.2 mg/dL (ref 8.4–10.5)
GFR: 88.18 mL/min (ref >60.00)
GLUCOSE: 127 mg/dL — AB (ref 70–99)
Potassium: 4.3 mEq/L (ref 3.5–5.1)
SODIUM: 141 meq/L (ref 135–145)
Total Protein: 7 g/dL (ref 6.0–8.3)

## 2016-01-17 LAB — HEMOGLOBIN A1C: Hgb A1c MFr Bld: 6.5 % (ref 4.6–6.5)

## 2016-01-17 NOTE — Progress Notes (Signed)
Pre visit review using our clinic review tool, if applicable. No additional management support is needed unless otherwise documented below in the visit note. 

## 2016-01-17 NOTE — Progress Notes (Signed)
Subjective:  Patient ID: Brandon Shannon, male    DOB: 06/14/1956  Age: 60 y.o. MRN: [id number]  CC: Hypertension and Diabetes   HPI Brandon Shannon presents for follow-up on hypertension and diabetes. He complains that about 2 weeks ago he had an episode of fatigue and dyspnea on exertion while he was cutting the grass. Since then he has felt well with no recurrence of those symptoms and he denies chest pain, diaphoresis, edema, palpitations, or syncope.  The last time I saw him his A1c was up to 7.5% so I asked him to start taking metformin which he has done. He is also lost some weight with lifestyle modifications. He denies polyuria, polydipsia, or polyphagia.  The paresthesias in his lower extremities have resolved. He underwent an EMG/NCS a few months ago which showed some concerns about the lumbosacral spine but there was no evidence of peripheral neuropathy. He denies back pain.   Outpatient Prescriptions Prior to Visit  Medication Sig Dispense Refill  . aspirin EC 81 MG tablet Take 1 tablet (81 mg total) by mouth daily. 90 tablet 3  . Azilsartan Medoxomil (EDARBI) 40 MG TABS Take 1 tablet by mouth daily. 90 tablet 3  . hydrocortisone (ANUSOL-HC) 25 MG suppository Place 1 suppository (25 mg total) rectally 2 (two) times daily. 12 suppository 0  . metFORMIN (GLUCOPHAGE XR) 750 MG 24 hr tablet Take 2 tablets (1,500 mg total) by mouth daily with breakfast. 180 tablet 1  . psyllium (METAMUCIL SMOOTH TEXTURE) 58.6 % powder Take 1 packet by mouth 3 (three) times daily. 283 g 12  . rosuvastatin (CRESTOR) 20 MG tablet Take 1 tablet (20 mg total) by mouth daily. 90 tablet 3   No facility-administered medications prior to visit.    ROS Review of Systems  Constitutional: Negative.  Negative for fever, chills, diaphoresis, appetite change and fatigue.  HENT: Negative.   Eyes: Negative.  Negative for visual disturbance.  Respiratory: Negative.  Negative for cough, choking, chest tightness,  shortness of breath and stridor.   Cardiovascular: Negative.  Negative for chest pain, palpitations and leg swelling.  Gastrointestinal: Negative.  Negative for nausea, vomiting, abdominal pain, diarrhea, constipation and blood in stool.  Endocrine: Negative.  Negative for polydipsia, polyphagia and polyuria.  Genitourinary: Negative.  Negative for difficulty urinating.  Musculoskeletal: Negative.  Negative for myalgias, back pain, joint swelling and neck pain.  Skin: Negative.  Negative for color change and rash.  Allergic/Immunologic: Negative.   Neurological: Negative.  Negative for dizziness, tremors, facial asymmetry, weakness, light-headedness and numbness.  Hematological: Negative.  Negative for adenopathy. Does not bruise/bleed easily.  Psychiatric/Behavioral: Negative.     Objective:  BP 120/82 mmHg  Pulse 72  Temp(Src) 98 F (36.7 C) (Oral)  Ht 5\' 11"  (1.803 m)  Wt 234 lb (106.142 kg)  BMI 32.65 kg/m2  SpO2 97%  BP Readings from Last 3 Encounters:  01/17/16 120/82  11/05/15 123/85  09/16/15 130/96    Wt Readings from Last 3 Encounters:  01/17/16 234 lb (106.142 kg)  11/05/15 238 lb (107.956 kg)  09/16/15 240 lb (108.863 kg)    Physical Exam  Constitutional: He is oriented to person, place, and time. No distress.  HENT:  Mouth/Throat: Oropharynx is clear and moist. No oropharyngeal exudate.  Eyes: Conjunctivae are normal. Right eye exhibits no discharge. Left eye exhibits no discharge. No scleral icterus.  Neck: Normal range of motion. Neck supple. No JVD present. No tracheal deviation present. No thyromegaly present.  Cardiovascular:  Normal rate, regular rhythm, normal heart sounds and intact distal pulses.  Exam reveals no gallop and no friction rub.   No murmur heard. Pulses:      Carotid pulses are 1+ on the right side, and 1+ on the left side.      Radial pulses are 1+ on the right side, and 1+ on the left side.       Femoral pulses are 1+ on the right  side, and 1+ on the left side.      Popliteal pulses are 1+ on the right side, and 1+ on the left side.       Dorsalis pedis pulses are 1+ on the right side, and 1+ on the left side.       Posterior tibial pulses are 1+ on the right side, and 1+ on the left side.  EKG -----  Sinus  Rhythm  WITHIN NORMAL LIMITS   Pulmonary/Chest: Effort normal and breath sounds normal. No stridor. No respiratory distress. He has no wheezes. He has no rales. He exhibits no tenderness.  Abdominal: Soft. Bowel sounds are normal. He exhibits no distension and no mass. There is no tenderness. There is no rebound and no guarding.  Musculoskeletal: Normal range of motion. He exhibits no edema or tenderness.  Lymphadenopathy:    He has no cervical adenopathy.  Neurological: He is alert and oriented to person, place, and time. He has normal strength. He displays no atrophy, no tremor and normal reflexes. No cranial nerve deficit or sensory deficit. He exhibits normal muscle tone. He displays a negative Romberg sign. He displays no seizure activity. Coordination and gait normal.  Skin: Skin is warm and dry. No rash noted. He is not diaphoretic. No erythema. No pallor.  Vitals reviewed.   Lab Results  Component Value Date   WBC 8.6 01/17/2016   HGB 14.5 01/17/2016   HCT 42.6 01/17/2016   PLT 172.0 01/17/2016   GLUCOSE 127* 01/17/2016   CHOL 174 09/16/2015   TRIG 75.0 09/16/2015   HDL 48.10 09/16/2015   LDLCALC 111* 09/16/2015   ALT 22 01/17/2016   AST 19 01/17/2016   NA 141 01/17/2016   K 4.3 01/17/2016   CL 106 01/17/2016   CREATININE 1.09 01/17/2016   BUN 14 01/17/2016   CO2 29 01/17/2016   TSH 0.96 09/16/2015   PSA 0.594 03/13/2013   HGBA1C 6.5 01/17/2016   MICROALBUR <0.7 09/16/2015    Dg Chest 2 View  01/12/2015  CLINICAL DATA:  Cough, congestion, shortness of breath for 1 month EXAM: CHEST  2 VIEW COMPARISON:  Chest x-ray of 04/01/2014 FINDINGS: No active infiltrate or effusion is seen.  Mediastinal and hilar contours are unchanged. The heart is within normal limits in size. No bony abnormality is seen. IMPRESSION: No active cardiopulmonary disease. Electronically Signed   By: Ivar Drape M.D.   On: 01/12/2015 15:05    Assessment & Plan:   Derron was seen today for hypertension and diabetes.  Diagnoses and all orders for this visit:  Essential hypertension- his blood pressure is well-controlled, electrolytes and renal function are stable. -     Comprehensive metabolic panel; Future  Elevated total protein- this has resolved -     Comprehensive metabolic panel; Future -     CBC with Differential/Platelet; Future  Type 2 diabetes mellitus with complication, without long-term current use of insulin ([place])- his A1c is down to 6.5%, his blood sugars are adequately well controlled, I praised him for the weight  loss and asked him to continue taking metformin as directed. -     Comprehensive metabolic panel; Future -     Hemoglobin A1c; Future  DOE (dyspnea on exertion)- his EKG and exam are normal but he has risk factors for coronary artery disease so I have asked him to undergo an exercise tolerance test. -     EKG 12-Lead -     Exercise Tolerance Test; Future  Paresthesia of both feet- this is resolved and his recent nerve testing was normal, will continue to observe for any changes related to this.   I am having Brandon Shannon maintain his aspirin EC, metFORMIN, Azilsartan Medoxomil, rosuvastatin, hydrocortisone, and psyllium.  No orders of the defined types were placed in this encounter.     Follow-up: Return in about 6 months (around 07/19/2016).  Scarlette Calico, MD

## 2016-01-17 NOTE — Patient Instructions (Signed)

## 2016-01-19 ENCOUNTER — Telehealth (HOSPITAL_COMMUNITY): Payer: Self-pay | Admitting: *Deleted

## 2016-01-19 NOTE — Telephone Encounter (Signed)
Spoke with patient regarding ETT--he states he will call when he is ready to schedule

## 2016-02-23 ENCOUNTER — Other Ambulatory Visit: Payer: Self-pay | Admitting: Physician Assistant

## 2016-03-02 ENCOUNTER — Telehealth: Payer: Self-pay | Admitting: Emergency Medicine

## 2016-03-02 NOTE — Telephone Encounter (Signed)
Pt called and wanted to know the results of his EKG on 01/17/16. Didn't see any results in. Please give pt a call back with those results. Thanks

## 2016-03-02 NOTE — Telephone Encounter (Signed)
I called pt and informed 01/17/16 EKG is normal. He requests 01/17/16 OV notes and EKG. All are upfront for p/u. Pt aware.

## 2016-03-06 ENCOUNTER — Ambulatory Visit (INDEPENDENT_AMBULATORY_CARE_PROVIDER_SITE_OTHER): Admitting: Emergency Medicine

## 2016-03-06 VITALS — BP 124/82 | HR 91 | Temp 98.1°F | Resp 17 | Ht 70.0 in | Wt 236.0 lb

## 2016-03-06 DIAGNOSIS — J029 Acute pharyngitis, unspecified: Secondary | ICD-10-CM

## 2016-03-06 DIAGNOSIS — T63483A Toxic effect of venom of other arthropod, assault, initial encounter: Secondary | ICD-10-CM

## 2016-03-06 DIAGNOSIS — S90562A Insect bite (nonvenomous), left ankle, initial encounter: Secondary | ICD-10-CM

## 2016-03-06 DIAGNOSIS — T63893A Toxic effect of contact with other venomous animals, assault, initial encounter: Secondary | ICD-10-CM | POA: Diagnosis not present

## 2016-03-06 DIAGNOSIS — R0981 Nasal congestion: Secondary | ICD-10-CM | POA: Diagnosis not present

## 2016-03-06 LAB — POCT RAPID STREP A (OFFICE): RAPID STREP A SCREEN: NEGATIVE

## 2016-03-06 MED ORDER — BETAMETHASONE DIPROPIONATE AUG 0.05 % EX OINT: TOPICAL_OINTMENT | Freq: Two times a day (BID) | CUTANEOUS | Status: DC

## 2016-03-06 MED ORDER — AZELASTINE HCL 0.1 % NA SOLN: 2.0000 | Freq: Two times a day (BID) | NASAL | Status: DC

## 2016-03-06 NOTE — Patient Instructions (Addendum)
Use your nasal spray twice a day. Take Zyrtec 10 mg 1 a day. This is over-the-counter. Apply cream to the insect sting left ankle twice a day.    IF you received an x-ray today, you will receive an invoice from [facility]. Please contact [facility] at [phone] with questions or concerns regarding your invoice.   IF you received labwork today, you will receive an invoice from [employer]. Please contact [facility] at [phone] with questions or concerns regarding your invoice.   Our billing staff will not be able to assist you with questions regarding bills from these companies.  You will be contacted with the lab results as soon as they are available. The fastest way to get your results is to activate your My Chart account. Instructions are located on the last page of this paperwork. If you have not heard from [place] regarding the results in 2 weeks, please contact this office.

## 2016-03-06 NOTE — Progress Notes (Signed)
By signing my name below, I, Raven Small, attest that this documentation has been prepared under the direction and in the presence of Arlyss Queen, MD.  Electronically Signed: Thea Alken, ED Scribe. 03/06/2016. 8:15 AM.  Chief Complaint:  Chief Complaint  Patient presents with  . Insect Bite    on back of left ankle     HPI: Brandon Shannon is a 60 y.o. male who reports to [facility] today complaining of cough and sore throat.  Pt states he's had a cold for the past week consisting of congestion with clear drainage, sore throat, cough producing minimal clear sputum and chills.  He's tried mucinex and other OTC cold medication with minimal relief. No nasal sprays tried. Pt has hx allergies but feels symptoms are more so a cold. Pt denies fever,  Otalgia and sinus pressure.   Pt also complains of being stung to posterior left ankle, sustained 1 weeks ago. Pt did not see an insect but assumes it was a bee.  States after he was stung area became swollen and is now itchy. He's tried OTC anti-itch cream.  Pt is retired and worked in Orthoptist.  Past Medical History  Diagnosis Date  . Hyperlipidemia   . Hypertension   . Diabetes mellitus without complication (HCC)     diet controlled  . Allergy     seasonal  . GERD (gastroesophageal reflux disease)    Past Surgical History  Procedure Laterality Date  . Foot surgery    . Colonoscopy  10-27-2004    normal   Social History   Social History  . Marital Status: Married    Spouse Name: N/A  . Number of Children: N/A  . Years of Education: N/A   Social History Main Topics  . Smoking status: Never Smoker   . Smokeless tobacco: Never Used  . Alcohol Use: 0.0 oz/week    0 Standard drinks or equivalent per week     Comment: occasionally  . Drug Use: No  . Sexual Activity: Yes   Other Topics Concern  . None   Social History Narrative   Family History  Problem Relation Age of Onset  . Diabetes Mother   . Heart disease Mother     . Hypertension Mother   . Arthritis Mother   . Hypertension Sister   . Cancer Neg Hx   . Alcohol abuse Neg Hx   . Drug abuse Neg Hx   . Early death Neg Hx   . Kidney disease Neg Hx   . Stroke Neg Hx   . Colon cancer Neg Hx   . Hypertension Sister    No Known Allergies Prior to Admission medications   Medication Sig Start Date End Date Taking? Authorizing Provider  Azilsartan Medoxomil (EDARBI) 40 MG TABS Take 1 tablet by mouth daily. 10/19/15  Yes Janith Lima, MD  metFORMIN (GLUCOPHAGE XR) 750 MG 24 hr tablet Take 2 tablets (1,500 mg total) by mouth daily with breakfast. 09/16/15  Yes Janith Lima, MD  psyllium (METAMUCIL SMOOTH TEXTURE) 58.6 % powder Take 1 packet by mouth 3 (three) times daily. 11/05/15  Yes Tereasa Coop, PA-C  rosuvastatin (CRESTOR) 20 MG tablet Take 1 tablet (20 mg total) by mouth daily. 10/19/15  Yes Janith Lima, MD  aspirin EC 81 MG tablet Take 1 tablet (81 mg total) by mouth daily. Patient not taking: Reported on 03/06/2016 09/16/15   Janith Lima, MD  hydrocortisone (ANUSOL-HC) 25 MG suppository  Place 1 suppository (25 mg total) rectally 2 (two) times daily. Patient not taking: Reported on 03/06/2016 11/05/15   Tereasa Coop, PA-C     ROS: The patient denies fevers,  night sweats, unintentional weight loss, chest pain, palpitations, wheezing, dyspnea on exertion, nausea, vomiting, abdominal pain, dysuria, hematuria, melena, numbness, weakness, or tingling.   All other systems have been reviewed and were otherwise negative with the exception of those mentioned in the HPI and as above.    PHYSICAL EXAM: Filed Vitals:   03/06/16 0812  BP: 124/82  Pulse: 91  Temp: 98.1 F (36.7 C)  TempSrc: Oral  Resp: 17  Height: 5\' 10"  (1.778 m)  Weight: 236 lb (107.049 kg)  SpO2: 99%    Body mass index is 33.86 kg/(m^2).   General: Alert, no acute distress HEENT:  Normocephalic, atraumatic, oropharynx patent. Significant nasal congestion. Turbinates blue  and swollen. TM's slight red.  Eye: Juliette Mangle [facility] Cardiovascular:  Regular rate and rhythm, no rubs murmurs or gallops.  No Carotid bruits, radial pulse intact. No pedal edema.  Respiratory: Clear to auscultation bilaterally.  No wheezes, rales, or rhonchi.  No cyanosis, no use of accessory musculature Abdominal: No organomegaly, abdomen is soft and non-tender, positive bowel sounds.  No masses. Musculoskeletal: Gait intact. No edema, tenderness. Minimal swollen around medial ankle. Multiple healed scar.  Skin: No rashes. Neurologic: Facial musculature symmetric. Psychiatric: Patient acts appropriately throughout our interaction. Lymphatic: No cervical or submandibular lymphadenopathy    LABS: Results for orders placed or performed in visit on 03/06/16  POCT rapid strep A  Result Value Ref Range   Rapid Strep A Screen Negative Negative      ASSESSMENT/PLAN: Patient does  not appear ill. The drainage from his nose and chest is not colored. He is afebrile. We'll treat with Astepro nasal spray. Advised to take Zyrtec 10 mg 1 a day to help with his nasal congestion and itching in his left ankle and gave him Diprolene AF topical cream to use to the insect bite of his ankle.I personally performed the services described in this documentation, which was scribed in my presence. The recorded information has been reviewed and is accurate.   Gross sideeffects, risk and benefits, and alternatives of medications d/w patient. Patient is aware that all medications have potential sideeffects and we are unable to predict every sideeffect or drug-drug interaction that may occur.  Arlyss Queen MD 03/06/2016 8:15 AM

## 2016-03-13 ENCOUNTER — Telehealth: Payer: Self-pay

## 2016-03-13 ENCOUNTER — Other Ambulatory Visit: Payer: Self-pay | Admitting: Emergency Medicine

## 2016-03-13 MED ORDER — AZELASTINE HCL 0.1 % NA SOLN: 2.0000 | Freq: Two times a day (BID) | NASAL | Status: DC

## 2016-03-13 NOTE — Telephone Encounter (Signed)
INS rejected Azelastine 0.15% nasal spray-must try azelastine 146mcg, fluticasone, flunisolide or ipratropium first

## 2016-03-13 NOTE — Telephone Encounter (Signed)
Spoke with pt and informed him of this

## 2016-03-13 NOTE — Telephone Encounter (Signed)
I have sent in the new nasal spray.

## 2016-03-15 LAB — HM DIABETES EYE EXAM

## 2016-03-24 ENCOUNTER — Telehealth (HOSPITAL_COMMUNITY): Payer: Self-pay | Admitting: Internal Medicine

## 2016-03-24 ENCOUNTER — Telehealth: Payer: Self-pay | Admitting: Internal Medicine

## 2016-03-24 NOTE — Telephone Encounter (Signed)
Called and spoke with Tammy and informed her that a few attempts have been made(5/24,6/29,7/28) to contact this patient to schedule his ETT and have yet to receive a response back. I just wanted to inform them that I would be removing the patient from the workqueue list. She voiced understanding and said she would let the doctor know.

## 2016-03-24 NOTE — Telephone Encounter (Signed)
States has tried to contact patient to set up exercise tolerance test on 5/24, 6/29 and 7/28.  Taking patient out of work que.

## 2016-03-30 NOTE — Telephone Encounter (Signed)
FYI

## 2016-08-28 ENCOUNTER — Encounter (HOSPITAL_COMMUNITY): Payer: Self-pay | Admitting: Emergency Medicine

## 2016-08-28 ENCOUNTER — Inpatient Hospital Stay (HOSPITAL_COMMUNITY)
Admission: EM | Admit: 2016-08-28 | Discharge: 2016-08-30 | DRG: 871 | Disposition: A | Discharge status: Home / Self Care | Attending: Internal Medicine | Admitting: Internal Medicine

## 2016-08-28 ENCOUNTER — Emergency Department (HOSPITAL_COMMUNITY)

## 2016-08-28 DIAGNOSIS — J1000 Influenza due to other identified influenza virus with unspecified type of pneumonia: Secondary | ICD-10-CM | POA: Diagnosis present

## 2016-08-28 DIAGNOSIS — J189 Pneumonia, unspecified organism: Secondary | ICD-10-CM

## 2016-08-28 DIAGNOSIS — N179 Acute kidney failure, unspecified: Secondary | ICD-10-CM | POA: Diagnosis present

## 2016-08-28 DIAGNOSIS — R509 Fever, unspecified: Secondary | ICD-10-CM

## 2016-08-28 DIAGNOSIS — J111 Influenza due to unidentified influenza virus with other respiratory manifestations: Secondary | ICD-10-CM | POA: Diagnosis present

## 2016-08-28 DIAGNOSIS — R55 Syncope and collapse: Secondary | ICD-10-CM

## 2016-08-28 DIAGNOSIS — E119 Type 2 diabetes mellitus without complications: Secondary | ICD-10-CM | POA: Diagnosis present

## 2016-08-28 DIAGNOSIS — F411 Generalized anxiety disorder: Secondary | ICD-10-CM | POA: Diagnosis present

## 2016-08-28 DIAGNOSIS — E118 Type 2 diabetes mellitus with unspecified complications: Secondary | ICD-10-CM | POA: Diagnosis not present

## 2016-08-28 DIAGNOSIS — J181 Lobar pneumonia, unspecified organism: Secondary | ICD-10-CM

## 2016-08-28 DIAGNOSIS — K219 Gastro-esophageal reflux disease without esophagitis: Secondary | ICD-10-CM | POA: Diagnosis present

## 2016-08-28 DIAGNOSIS — Z7984 Long term (current) use of oral hypoglycemic drugs: Secondary | ICD-10-CM

## 2016-08-28 DIAGNOSIS — E785 Hyperlipidemia, unspecified: Secondary | ICD-10-CM | POA: Diagnosis present

## 2016-08-28 DIAGNOSIS — R000 Tachycardia, unspecified: Secondary | ICD-10-CM | POA: Diagnosis present

## 2016-08-28 DIAGNOSIS — A419 Sepsis, unspecified organism: Secondary | ICD-10-CM | POA: Diagnosis present

## 2016-08-28 DIAGNOSIS — Z8261 Family history of arthritis: Secondary | ICD-10-CM

## 2016-08-28 DIAGNOSIS — E860 Dehydration: Secondary | ICD-10-CM | POA: Diagnosis present

## 2016-08-28 DIAGNOSIS — Z6833 Body mass index (BMI) 33.0-33.9, adult: Secondary | ICD-10-CM

## 2016-08-28 DIAGNOSIS — Z833 Family history of diabetes mellitus: Secondary | ICD-10-CM | POA: Diagnosis not present

## 2016-08-28 DIAGNOSIS — E669 Obesity, unspecified: Secondary | ICD-10-CM | POA: Diagnosis present

## 2016-08-28 DIAGNOSIS — Z8249 Family history of ischemic heart disease and other diseases of the circulatory system: Secondary | ICD-10-CM | POA: Diagnosis not present

## 2016-08-28 DIAGNOSIS — I10 Essential (primary) hypertension: Secondary | ICD-10-CM | POA: Diagnosis present

## 2016-08-28 HISTORY — DX: Other seasonal allergic rhinitis: J30.2

## 2016-08-28 HISTORY — DX: Syncope and collapse: R55

## 2016-08-28 HISTORY — DX: Pneumonia, unspecified organism: J18.9

## 2016-08-28 HISTORY — DX: Acute kidney failure, unspecified: N17.9

## 2016-08-28 HISTORY — DX: Unspecified osteoarthritis, unspecified site: M19.90

## 2016-08-28 HISTORY — DX: Type 2 diabetes mellitus without complications: E11.9

## 2016-08-28 LAB — PROTIME-INR
INR: 1.14
Prothrombin Time: 14.6 seconds (ref 11.4–15.2)

## 2016-08-28 LAB — BASIC METABOLIC PANEL
ANION GAP: 10 (ref 5–15)
BUN: 10 mg/dL (ref 6–20)
CALCIUM: 8.9 mg/dL (ref 8.9–10.3)
CO2: 25 mmol/L (ref 22–32)
Chloride: 100 mmol/L — ABNORMAL LOW (ref 101–111)
Creatinine, Ser: 1.31 mg/dL — ABNORMAL HIGH (ref 0.61–1.24)
GFR, EST NON AFRICAN AMERICAN: 57 mL/min — AB (ref >60)
Glucose, Bld: 201 mg/dL — ABNORMAL HIGH (ref 65–99)
Potassium: 3.8 mmol/L (ref 3.5–5.1)
Sodium: 135 mmol/L (ref 135–145)

## 2016-08-28 LAB — GLUCOSE, CAPILLARY
GLUCOSE-CAPILLARY: 132 mg/dL — AB (ref 65–99)
GLUCOSE-CAPILLARY: 121 mg/dL — AB (ref 65–99)
Glucose-Capillary: 153 mg/dL — ABNORMAL HIGH (ref 65–99)
Glucose-Capillary: 195 mg/dL — ABNORMAL HIGH (ref 65–99)

## 2016-08-28 LAB — CBC
HEMATOCRIT: 44.2 % (ref 39.0–52.0)
Hemoglobin: 14.8 g/dL (ref 13.0–17.0)
MCH: 30.3 pg (ref 26.0–34.0)
MCHC: 33.5 g/dL (ref 30.0–36.0)
MCV: 90.4 fL (ref 78.0–100.0)
PLATELETS: 153 10*3/uL (ref 150–400)
RBC: 4.89 MIL/uL (ref 4.22–5.81)
RDW: 13.3 % (ref 11.5–15.5)
WBC: 9 10*3/uL (ref 4.0–10.5)

## 2016-08-28 LAB — LACTIC ACID, PLASMA
Lactic Acid, Venous: 1.9 mmol/L (ref 0.5–1.9)
Lactic Acid, Venous: 1.7 mmol/L (ref 0.5–1.9)

## 2016-08-28 LAB — LIPASE, BLOOD: Lipase: 36 U/L (ref 11–51)

## 2016-08-28 LAB — HEPATIC FUNCTION PANEL
ALBUMIN: 3.4 g/dL — AB (ref 3.5–5.0)
ALK PHOS: 78 U/L (ref 38–126)
ALT: 33 U/L (ref 17–63)
AST: 25 U/L (ref 15–41)
BILIRUBIN INDIRECT: 0.7 mg/dL (ref 0.3–0.9)
BILIRUBIN TOTAL: 0.8 mg/dL (ref 0.3–1.2)
Bilirubin, Direct: 0.1 mg/dL (ref 0.1–0.5)
TOTAL PROTEIN: 7.5 g/dL (ref 6.5–8.1)

## 2016-08-28 LAB — INFLUENZA PANEL BY PCR (TYPE A & B)
Influenza A By PCR: POSITIVE — AB
Influenza B By PCR: NEGATIVE

## 2016-08-28 LAB — URINALYSIS, ROUTINE W REFLEX MICROSCOPIC
Bilirubin Urine: NEGATIVE
GLUCOSE, UA: NEGATIVE mg/dL
HGB URINE DIPSTICK: NEGATIVE
KETONES UR: NEGATIVE mg/dL
Leukocytes, UA: NEGATIVE
Nitrite: NEGATIVE
PH: 7 (ref 5.0–8.0)
Protein, ur: NEGATIVE mg/dL
Specific Gravity, Urine: 1.017 (ref 1.005–1.030)

## 2016-08-28 LAB — PROCALCITONIN: Procalcitonin: 0.23 ng/mL

## 2016-08-28 LAB — APTT: APTT: 34 s (ref 24–36)

## 2016-08-28 LAB — I-STAT CG4 LACTIC ACID, ED: Lactic Acid, Venous: 1.73 mmol/L (ref 0.5–1.9)

## 2016-08-28 LAB — CBG MONITORING, ED: Glucose-Capillary: 177 mg/dL — ABNORMAL HIGH (ref 65–99)

## 2016-08-28 MED ORDER — PIPERACILLIN-TAZOBACTAM 3.375 G IVPB 30 MIN
3.3750 g | Freq: Once | INTRAVENOUS | Status: AC
  Administered 2016-08-28: 3.375 g via INTRAVENOUS
  Filled 2016-08-28: qty 50

## 2016-08-28 MED ORDER — SODIUM CHLORIDE 0.9 % IV BOLUS (SEPSIS)
500.0000 mL | Freq: Once | INTRAVENOUS | Status: DC
Start: 2016-08-28 — End: 2016-08-30

## 2016-08-28 MED ORDER — IPRATROPIUM-ALBUTEROL 0.5-2.5 (3) MG/3ML IN SOLN
3.0000 mL | Freq: Four times a day (QID) | RESPIRATORY_TRACT | Status: DC
  Administered 2016-08-28: 3 mL via RESPIRATORY_TRACT
  Filled 2016-08-28 (×2): qty 3

## 2016-08-28 MED ORDER — DEXTROSE 5 % IV SOLN
1.0000 g | INTRAVENOUS | Status: DC
  Administered 2016-08-28 – 2016-08-30 (×3): 1 g via INTRAVENOUS
  Filled 2016-08-28 (×3): qty 10

## 2016-08-28 MED ORDER — ACETAMINOPHEN 650 MG RE SUPP: 650.0000 mg | Freq: Four times a day (QID) | RECTAL | Status: DC | PRN

## 2016-08-28 MED ORDER — SODIUM CHLORIDE 0.9 % IV SOLN
INTRAVENOUS | Status: AC
  Administered 2016-08-28 (×2): via INTRAVENOUS

## 2016-08-28 MED ORDER — ONDANSETRON HCL 4 MG/2ML IJ SOLN: 4.0000 mg | Freq: Four times a day (QID) | INTRAMUSCULAR | Status: DC | PRN

## 2016-08-28 MED ORDER — HYDROCODONE-HOMATROPINE 5-1.5 MG/5ML PO SYRP
5.0000 mL | ORAL_SOLUTION | Freq: Four times a day (QID) | ORAL | Status: DC | PRN
  Administered 2016-08-28: 5 mL via ORAL
  Filled 2016-08-28: qty 5

## 2016-08-28 MED ORDER — SENNOSIDES-DOCUSATE SODIUM 8.6-50 MG PO TABS: 1.0000 | ORAL_TABLET | Freq: Every evening | ORAL | Status: DC | PRN

## 2016-08-28 MED ORDER — BENZONATATE 100 MG PO CAPS
100.0000 mg | ORAL_CAPSULE | Freq: Three times a day (TID) | ORAL | Status: DC | PRN
  Administered 2016-08-28: 100 mg via ORAL
  Filled 2016-08-28: qty 1

## 2016-08-28 MED ORDER — HYDROCODONE-ACETAMINOPHEN 5-325 MG PO TABS
1.0000 | ORAL_TABLET | ORAL | Status: DC | PRN
  Administered 2016-08-28 – 2016-08-29 (×2): 1 via ORAL
  Administered 2016-08-30: 2 via ORAL
  Filled 2016-08-28: qty 2
  Filled 2016-08-28 (×2): qty 1

## 2016-08-28 MED ORDER — HYDROXYZINE HCL 10 MG PO TABS
20.0000 mg | ORAL_TABLET | Freq: Every evening | ORAL | Status: DC | PRN
  Administered 2016-08-29: 20 mg via ORAL
  Filled 2016-08-28 (×3): qty 2

## 2016-08-28 MED ORDER — SODIUM CHLORIDE 0.9 % IV SOLN
INTRAVENOUS | Status: DC
  Administered 2016-08-28: 10:00:00 via INTRAVENOUS

## 2016-08-28 MED ORDER — HYDRALAZINE HCL 20 MG/ML IJ SOLN: 5.0000 mg | INTRAMUSCULAR | Status: DC | PRN

## 2016-08-28 MED ORDER — ROSUVASTATIN CALCIUM 10 MG PO TABS
40.0000 mg | ORAL_TABLET | Freq: Every day | ORAL | Status: DC
  Administered 2016-08-28 – 2016-08-30 (×3): 40 mg via ORAL
  Filled 2016-08-28 (×3): qty 4

## 2016-08-28 MED ORDER — ACETAMINOPHEN 325 MG PO TABS
650.0000 mg | ORAL_TABLET | Freq: Four times a day (QID) | ORAL | Status: DC | PRN
  Administered 2016-08-28 – 2016-08-29 (×2): 650 mg via ORAL
  Filled 2016-08-28 (×3): qty 2

## 2016-08-28 MED ORDER — IPRATROPIUM-ALBUTEROL 0.5-2.5 (3) MG/3ML IN SOLN: 3.0000 mL | Freq: Four times a day (QID) | RESPIRATORY_TRACT | Status: DC | PRN

## 2016-08-28 MED ORDER — VANCOMYCIN HCL IN DEXTROSE 1-5 GM/200ML-% IV SOLN: 1000.0000 mg | Freq: Once | INTRAVENOUS | Status: DC

## 2016-08-28 MED ORDER — INSULIN ASPART 100 UNIT/ML ~~LOC~~ SOLN
0.0000 [IU] | Freq: Three times a day (TID) | SUBCUTANEOUS | Status: DC
  Administered 2016-08-28: 3 [IU] via SUBCUTANEOUS
  Administered 2016-08-28 – 2016-08-29 (×2): 2 [IU] via SUBCUTANEOUS
  Administered 2016-08-30: 3 [IU] via SUBCUTANEOUS
  Administered 2016-08-30: 2 [IU] via SUBCUTANEOUS

## 2016-08-28 MED ORDER — SODIUM CHLORIDE 0.9 % IV BOLUS (SEPSIS)
1000.0000 mL | Freq: Once | INTRAVENOUS | Status: AC
  Administered 2016-08-28: 1000 mL via INTRAVENOUS

## 2016-08-28 MED ORDER — OSELTAMIVIR PHOSPHATE 75 MG PO CAPS
75.0000 mg | ORAL_CAPSULE | Freq: Two times a day (BID) | ORAL | Status: DC
Start: 2016-08-28 — End: 2016-08-30
  Administered 2016-08-28 – 2016-08-30 (×5): 75 mg via ORAL
  Filled 2016-08-28 (×5): qty 1

## 2016-08-28 MED ORDER — VANCOMYCIN HCL 10 G IV SOLR
1500.0000 mg | Freq: Once | INTRAVENOUS | Status: AC
  Administered 2016-08-28: 1500 mg via INTRAVENOUS
  Filled 2016-08-28: qty 1500

## 2016-08-28 MED ORDER — AZITHROMYCIN 500 MG PO TABS
500.0000 mg | ORAL_TABLET | ORAL | Status: DC
  Administered 2016-08-28 – 2016-08-30 (×3): 500 mg via ORAL
  Filled 2016-08-28 (×3): qty 1

## 2016-08-28 MED ORDER — ACETAMINOPHEN 500 MG PO TABS
1000.0000 mg | ORAL_TABLET | Freq: Once | ORAL | Status: AC
  Administered 2016-08-28: 1000 mg via ORAL
  Filled 2016-08-28: qty 2

## 2016-08-28 MED ORDER — ENOXAPARIN SODIUM 40 MG/0.4ML ~~LOC~~ SOLN
40.0000 mg | SUBCUTANEOUS | Status: DC
  Administered 2016-08-28 – 2016-08-29 (×2): 40 mg via SUBCUTANEOUS
  Filled 2016-08-28 (×2): qty 0.4

## 2016-08-28 MED ORDER — ONDANSETRON HCL 4 MG PO TABS: 4.0000 mg | ORAL_TABLET | Freq: Four times a day (QID) | ORAL | Status: DC | PRN

## 2016-08-28 MED ORDER — INSULIN ASPART 100 UNIT/ML ~~LOC~~ SOLN: 0.0000 [IU] | Freq: Every day | SUBCUTANEOUS | Status: DC

## 2016-08-28 MED ORDER — SODIUM CHLORIDE 0.9 % IV BOLUS (SEPSIS): 1000.0000 mL | Freq: Once | INTRAVENOUS | Status: DC

## 2016-08-28 MED ORDER — HYDROCODONE-HOMATROPINE 5-1.5 MG/5ML PO SYRP
5.0000 mL | ORAL_SOLUTION | Freq: Once | ORAL | Status: AC
  Administered 2016-08-28: 5 mL via ORAL
  Filled 2016-08-28: qty 5

## 2016-08-28 MED ORDER — SODIUM CHLORIDE 0.9% FLUSH
3.0000 mL | Freq: Two times a day (BID) | INTRAVENOUS | Status: DC
  Administered 2016-08-28 – 2016-08-30 (×3): 3 mL via INTRAVENOUS

## 2016-08-28 NOTE — ED Provider Notes (Signed)
[place] DEPT Provider Note   CSN: [id number] Arrival date & time: 08/28/16  0321     History   Chief Complaint Chief Complaint  Patient presents with  . Loss of Consciousness    HPI Brandon Shannon is a 61 y.o. male with a hx of Seasonal allergies, non-insulin-dependent diabetes, GERD, hypertension presents to the Emergency Department After syncopal episode approximately one hour prior to arrival. Patient ports general illness and cough 1 week. He reports decreased urination and decreased bowel movements. Last night he got out of bed around 2:45 AM to attempt to use the bathroom. When he went to stand from the toilet he had a total syncopal episode falling to the floor and striking the left side of his face and his left elbow. Patient does not remember this incident.  Patient reports that upon her arrival patient was lying on the bathroom floor. She helped him sit up when he began to vomit.  She reports patient very lethargic at that time.  Patient has a history of non-insulin-dependent diabetes and hypertension.  He is primarily cared for at the [place].   The history is provided by the patient and medical records. No language interpreter was used.    Past Medical History:  Diagnosis Date  . Allergy    seasonal  . Diabetes mellitus without complication ([place])    diet controlled  . GERD (gastroesophageal reflux disease)   . Hyperlipidemia   . Hypertension     Patient Active Problem List   Diagnosis Date Noted  . DOE (dyspnea on exertion) 01/17/2016  . Paresthesia of both feet 09/16/2015  . Screening for colon cancer 09/17/2014  . Elevated total protein 04/08/2014  . GAD (generalized anxiety disorder) 03/04/2014  . Unspecified vitamin D deficiency 04/02/2013  . Type II diabetes mellitus with manifestations ([place]) 09/18/2007  . Hyperlipidemia with target LDL less than 100 04/01/2007  . OBESITY NOS 04/01/2007  . Essential hypertension 04/01/2007    Past Surgical History:    Procedure Laterality Date  . COLONOSCOPY  10-27-2004   normal  . FOOT SURGERY         Home Medications    Prior to Admission medications   Medication Sig Start Date End Date Taking? Authorizing Provider  Azilsartan Medoxomil (EDARBI) 40 MG TABS Take 1 tablet by mouth daily. 10/19/15  Yes Janith Lima, MD  hydrOXYzine (ATARAX/VISTARIL) 10 MG tablet Take 20 mg by mouth at bedtime as needed for itching.   Yes Historical Provider, MD  metFORMIN (GLUCOPHAGE XR) 750 MG 24 hr tablet Take 2 tablets (1,500 mg total) by mouth daily with breakfast. 09/16/15  Yes Janith Lima, MD  rosuvastatin (CRESTOR) 40 MG tablet Take 40 mg by mouth daily.   Yes Historical Provider, MD  aspirin EC 81 MG tablet Take 1 tablet (81 mg total) by mouth daily. Patient not taking: Reported on 08/28/2016 09/16/15   Janith Lima, MD  augmented betamethasone dipropionate (DIPROLENE-AF) 0.05 % ointment Apply topically 2 (two) times daily. Patient not taking: Reported on 08/28/2016 03/06/16   Darlyne Russian, MD  azelastine (ASTELIN) 0.1 % nasal spray Place 2 sprays into both nostrils 2 (two) times daily. Use in each nostril as directed Patient not taking: Reported on 08/28/2016 03/13/16   Darlyne Russian, MD  hydrocortisone (ANUSOL-HC) 25 MG suppository Place 1 suppository (25 mg total) rectally 2 (two) times daily. Patient not taking: Reported on 08/28/2016 11/05/15   Tereasa Coop, PA-C  psyllium (METAMUCIL SMOOTH TEXTURE)  58.6 % powder Take 1 packet by mouth 3 (three) times daily. Patient not taking: Reported on 08/28/2016 11/05/15   Tereasa Coop, PA-C  rosuvastatin (CRESTOR) 20 MG tablet Take 1 tablet (20 mg total) by mouth daily. Patient not taking: Reported on 08/28/2016 10/19/15   Janith Lima, MD    Family History Family History  Problem Relation Age of Onset  . Diabetes Mother   . Heart disease Mother   . Hypertension Mother   . Arthritis Mother   . Hypertension Sister   . Hypertension Sister   . Cancer Neg Hx   .  Alcohol abuse Neg Hx   . Drug abuse Neg Hx   . Early death Neg Hx   . Kidney disease Neg Hx   . Stroke Neg Hx   . Colon cancer Neg Hx     Social History Social History  Substance Use Topics  . Smoking status: Never Smoker  . Smokeless tobacco: Never Used  . Alcohol use 0.0 oz/week     Comment: occasionally     Allergies   Patient has no known allergies.   Review of Systems Review of Systems  Constitutional: Positive for chills and fatigue. Negative for fever.  Respiratory: Positive for cough.   Gastrointestinal: Positive for nausea and vomiting.  Musculoskeletal: Positive for arthralgias ( Left elbow).  Neurological: Positive for syncope.     Physical Exam Updated Vital Signs BP 129/81   Pulse 101   Temp 98.6 F (37 C) (Oral)   Resp 18   Ht 5\' 11"  (1.803 m)   Wt 108.9 kg   SpO2 93%   BMI 33.47 kg/m   Physical Exam  Constitutional: He appears well-developed and well-nourished. No distress.  Awake, alert, acutely ill-appearing  HENT:  Head: Normocephalic and atraumatic.  Mouth/Throat: Oropharynx is clear and moist. No oropharyngeal exudate.  Eyes: Conjunctivae are normal. No scleral icterus.  Neck: Normal range of motion. Neck supple.  Cardiovascular: Regular rhythm and intact distal pulses.  Tachycardia present.   No murmur heard. Pulses:      Radial pulses are 2+ on the right side, and 2+ on the left side.       Dorsalis pedis pulses are 2+ on the right side, and 2+ on the left side.  Pulmonary/Chest: Effort normal. No respiratory distress.  Equal chest expansion Course breath sounds throughout with harsh cough  Abdominal: Soft. Bowel sounds are normal. He exhibits no mass. There is generalized tenderness ( Mild). There is no rigidity, no rebound and no guarding.  Musculoskeletal: Normal range of motion. He exhibits no edema.       Left elbow: He exhibits normal range of motion, no swelling, no effusion, no deformity and no laceration. Tenderness found.  Olecranon process tenderness noted.  Neurological: He is alert.  Speech is clear and goal oriented Moves extremities without ataxia  Skin: Skin is warm. He is diaphoretic.  Psychiatric: He has a normal mood and affect.  Nursing note and vitals reviewed.    ED Treatments / Results  Labs (all labs ordered are listed, but only abnormal results are displayed) Labs Reviewed  BASIC METABOLIC PANEL - Abnormal; Notable for the following:       Result Value   Chloride 100 (*)    Glucose, Bld 201 (*)    Creatinine, Ser 1.31 (*)    GFR calc non Af Amer 57 (*)    All other components within normal limits  HEPATIC FUNCTION PANEL - Abnormal; Notable  for the following:    Albumin 3.4 (*)    All other components within normal limits  CBG MONITORING, ED - Abnormal; Notable for the following:    Glucose-Capillary 177 (*)    All other components within normal limits  CULTURE, BLOOD (ROUTINE X 2)  CULTURE, BLOOD (ROUTINE X 2)  URINE CULTURE  CBC  URINALYSIS, ROUTINE W REFLEX MICROSCOPIC  LIPASE, BLOOD  INFLUENZA PANEL BY PCR (TYPE A & B, H1N1)  I-STAT CG4 LACTIC ACID, ED    EKG  EKG Interpretation  Date/Time:  Monday August 28 2016 03:32:24 EST Ventricular Rate:  105 PR Interval:  156 QRS Duration: 82 QT Interval:  336 QTC Calculation: 444 R Axis:   81 Text Interpretation:  Sinus tachycardia Otherwise normal ECG no wpw, prolonged qt or brugada Since last tracing rate faster Otherwise no significant change Confirmed by Tyrone Nine MD, DANIEL [phone]) on 08/28/2016 3:55:08 AM       Radiology Dg Chest 2 View  Result Date: 08/28/2016 CLINICAL DATA:  Syncopal episode today. EXAM: CHEST  2 VIEW COMPARISON:  01/12/2015 FINDINGS: Patchy opacity in the right upper lobe inferiorly may represent early pneumonia, aspiration, pulmonary hemorrhage. Left lung is clear. No pleural effusions. Normal heart size. Normal pulmonary vasculature. IMPRESSION: Right upper lobe airspace opacity, possibly early  pneumonia. Followup PA and lateral chest X-ray is recommended in 3-4 weeks following trial of antibiotic therapy to ensure resolution and exclude underlying malignancy. Electronically Signed   By: Andreas Newport M.D.   On: 08/28/2016 05:55   Dg Elbow Complete Left  Result Date: 08/28/2016 CLINICAL DATA:  [place] out of bed at 02:45 EXAM: LEFT ELBOW - COMPLETE 3+ VIEW COMPARISON:  None. FINDINGS: There is no evidence of fracture, dislocation, or joint effusion. There is no evidence of arthropathy or other focal bone abnormality. Soft tissues are unremarkable. IMPRESSION: Negative. Electronically Signed   By: Andreas Newport M.D.   On: 08/28/2016 05:55   Ct Head Wo Contrast  Result Date: 08/28/2016 CLINICAL DATA:  [place] out of bed at 0245 hours. Amnesia to event. Constipation, vomiting and syncopal episode today. History of hypertension, hyperlipidemia and diabetes. EXAM: CT HEAD WITHOUT CONTRAST TECHNIQUE: Contiguous axial images were obtained from the base of the skull through the vertex without intravenous contrast. COMPARISON:  None. FINDINGS: BRAIN: The ventricles and sulci are normal for age. No intraparenchymal hemorrhage, mass effect nor midline shift. No acute large vascular territory infarcts. No abnormal extra-axial fluid collections. Basal cisterns are patent. VASCULAR: Minimal calcific atherosclerosis of the carotid siphons and RIGHT vertebral artery. SKULL: No skull fracture. Severe bilateral temporomandibular osteoarthrosis. No significant scalp soft tissue swelling. SINUSES/ORBITS: Trace paranasal sinus mucosal thickening. Mastoid air cells are well aerated. The included ocular globes and orbital contents are non-suspicious. OTHER: None. IMPRESSION: Negative CT HEAD. Electronically Signed   By: Elon Alas M.D.   On: 08/28/2016 04:09    Procedures Procedures (including critical care time)  Medications Ordered in ED Medications  sodium chloride 0.9 % bolus 1,000 mL (1,000 mLs  Intravenous New Bag/Given 08/28/16 0526)    And  sodium chloride 0.9 % bolus 1,000 mL (not administered)    And  sodium chloride 0.9 % bolus 1,000 mL (1,000 mLs Intravenous New Bag/Given 08/28/16 0511)    And  sodium chloride 0.9 % bolus 500 mL (not administered)  vancomycin (VANCOCIN) 1,500 mg in sodium chloride 0.9 % 500 mL IVPB (1,500 mg Intravenous New Bag/Given 08/28/16 0519)  acetaminophen (TYLENOL) tablet 1,000 mg (not administered)  HYDROcodone-homatropine (HYCODAN) 5-1.5 MG/5ML syrup 5 mL (not administered)  piperacillin-tazobactam (ZOSYN) IVPB 3.375 g (0 g Intravenous Stopped 08/28/16 0544)     Initial Impression / Assessment and Plan / ED Course  I have reviewed the triage vital signs and the nursing notes.  Pertinent labs & imaging results that were available during my care of the patient were reviewed by me and considered in my medical decision making (see chart for details).  Clinical Course as of Aug 28 621  Mon Aug 28, 2016  [id number] Chest x-ray with evidence of pneumonia. DG Chest 2 View [HM]  [id number] Patient febrile and tachycardic with tachypnea With oxygen saturation of 93% on room air concern for SEPSIS.   Temp: 102.2 F (39 C) [HM]  0616 Mild AK I Creatinine: (!) 1.31 [HM]  0616 WBC: 9.0 [HM]  0616 Within normal limits Lactic Acid, Venous: 1.73 [HM]  0617 Sinus tachycardia EKG 12-Lead [HM]    Clinical Course User Index [HM] Zsofia Prout, PA-C    Pt with syncope, fever and Sepsis from pneumonia.  Patient receiving fluids, antibiotics. We'll admit for further workup and treatment.  Final Clinical Impressions(s) / ED Diagnoses   Final diagnoses:  Syncope and collapse  Community acquired pneumonia of right upper lobe of lung ([place])  Tachycardia  Fever, unspecified fever cause    New Prescriptions New Prescriptions   No medications on file     Abigail Butts, PA-C 08/28/16 [place], DO 08/28/16 [phone]

## 2016-08-28 NOTE — ED Triage Notes (Addendum)
Family reports that pt was lsn at 2100 when he went to bed. Visitor reports that she heard a thump in her sleep around 0245. EMS came to house and did and EKG and told pt that he needed to come to ED. Pt reports constipation and vomited after his syncopal episode today.

## 2016-08-28 NOTE — ED Notes (Signed)
Attempted report 

## 2016-08-28 NOTE — H&P (Signed)
History and Physical    Brandon Shannon [id number] DOB: [birth year] DOA: 08/28/2016  PCP: Scarlette Calico, MD Patient coming from: home  Chief Complaint: syncope  HPI: Brandon Shannon is a very pleasnat 61 y.o. male with medical history significant of DM on metformin, gerd,hypertension presents to the emergency department with chief complaint of syncope and a five-day history of cough congestion fever. Initial evaluation reveals sepsis likely related to community-acquired pneumonia in the setting of dehydration  Information is obtained from the patient and his wife is at the bedside. He reports for the last 5 days she's experienced frequent wet nonproductive cough intermittent fever chills decreased oral intake. Last week he had 2 episodes of emesis and this morning after his syncopal event wife reports emesis. He denied any coffee ground emesis. He awakened this morning goes to the restroom. When he stood up from the commode he experienced syncope falling to the floor. He has no memory of the incident. Wife reports hearing him hit the ground. He denies headache dizziness chest pain palpitations. He does report some increased shortness of breath with exertion over the last couple of days. He denies abdominal pain lower extremity edema numbness tingling of his extremities. He denies dysuria hematuria frequency or urgency.    ED Course: The emergency department he has rectal temp of 102.2, he's tachycardia with mild tachypnea blood pressure stable. Provided with IV fluids vancomycin and Zosyn  Review of Systems: As per HPI otherwise 10 point review of systems negative.   Ambulatory Status: He ambulates independently. Independent with ADLs  Past Medical History:  Diagnosis Date  . AKI (acute kidney injury) ([place])   . Allergy    seasonal  . CAP (community acquired pneumonia)   . Diabetes mellitus without complication (HCC)    diet controlled  . GERD (gastroesophageal reflux disease)   .  Hyperlipidemia   . Hypertension   . Syncope     Past Surgical History:  Procedure Laterality Date  . COLONOSCOPY  10-27-2004   normal  . FOOT SURGERY      Social History   Social History  . Marital status: Married    Spouse name: N/A  . Number of children: N/A  . Years of education: N/A   Occupational History  . Not on file.   Social History Main Topics  . Smoking status: Never Smoker  . Smokeless tobacco: Never Used  . Alcohol use 0.0 oz/week     Comment: occasionally  . Drug use: No  . Sexual activity: Yes   Other Topics Concern  . Not on file   Social History Narrative  . No narrative on file    No Known Allergies  Family History  Problem Relation Age of Onset  . Diabetes Mother   . Heart disease Mother   . Hypertension Mother   . Arthritis Mother   . Hypertension Sister   . Hypertension Sister   . Cancer Neg Hx   . Alcohol abuse Neg Hx   . Drug abuse Neg Hx   . Early death Neg Hx   . Kidney disease Neg Hx   . Stroke Neg Hx   . Colon cancer Neg Hx     Prior to Admission medications   Medication Sig Start Date End Date Taking? Authorizing Provider  Azilsartan Medoxomil (EDARBI) 40 MG TABS Take 1 tablet by mouth daily. 10/19/15  Yes Janith Lima, MD  hydrOXYzine (ATARAX/VISTARIL) 10 MG tablet Take 20 mg by mouth at bedtime as  needed for itching.   Yes Historical Provider, MD  metFORMIN (GLUCOPHAGE XR) 750 MG 24 hr tablet Take 2 tablets (1,500 mg total) by mouth daily with breakfast. 09/16/15  Yes Janith Lima, MD  rosuvastatin (CRESTOR) 40 MG tablet Take 40 mg by mouth daily.   Yes Historical Provider, MD    Physical Exam: Vitals:   08/28/16 0450 08/28/16 0515 08/28/16 0630 08/28/16 0645  BP:  154/89 132/84 128/83  Pulse:  105 113 120  Resp:  21 21 24   Temp: 102.2 F (39 C)     TempSrc: Rectal     SpO2:  95% 98% 98%  Weight:      Height:         General:  Appears calm and comfortable and acutely ill but not toxic,very warm to  touch Eyes:  PERRL, EOMI, normal lids, iris ENT:  grossly normal hearing, lips & tongue, because membranes of his mouth are pink slightly dry Neck:  no LAD, masses or thyromegaly Cardiovascular:  Tachycardic regular, no m/r/g. No LE edema.  Respiratory:  Mild increased work of breathing with conversation. Breath sounds slightly diminished throughout very fine crackles on the right lower base wheezing requiring moist nonproductive cough Abdomen:  soft, ntnd, positive bowel sounds, no guarding or rebounding Skin:  no rash or induration seen on limited exam Musculoskeletal:  grossly normal tone BUE/BLE, good ROM, no bony abnormality Psychiatric:  grossly normal mood and affect, speech fluent and appropriate, AOx3 Neurologic:  CN 2-12 grossly intact, moves all extremities in coordinated fashion, sensation intact  Labs on Admission: I have personally reviewed following labs and imaging studies  CBC:  Recent Labs Lab 08/28/16 0331  WBC 9.0  HGB 14.8  HCT 44.2  MCV 90.4  PLT 0000000   Basic Metabolic Panel:  Recent Labs Lab 08/28/16 0331  NA 135  K 3.8  CL 100*  CO2 25  GLUCOSE 201*  BUN 10  CREATININE 1.31*  CALCIUM 8.9   GFR: Estimated Creatinine Clearance: 73.4 mL/min (by C-G formula based on SCr of 1.31 mg/dL (H)). Liver Function Tests:  Recent Labs Lab 08/28/16 0331  AST 25  ALT 33  ALKPHOS 78  BILITOT 0.8  PROT 7.5  ALBUMIN 3.4*    Recent Labs Lab 08/28/16 0331  LIPASE 36   No results for input(s): AMMONIA in the last 168 hours. Coagulation Profile: No results for input(s): INR, PROTIME in the last 168 hours. Cardiac Enzymes: No results for input(s): CKTOTAL, CKMB, CKMBINDEX, TROPONINI in the last 168 hours. BNP (last 3 results) No results for input(s): PROBNP in the last 8760 hours. HbA1C: No results for input(s): HGBA1C in the last 72 hours. CBG:  Recent Labs Lab 08/28/16 0345  GLUCAP 177*   Lipid Profile: No results for input(s): CHOL, HDL,  LDLCALC, TRIG, CHOLHDL, LDLDIRECT in the last 72 hours. Thyroid Function Tests: No results for input(s): TSH, T4TOTAL, FREET4, T3FREE, THYROIDAB in the last 72 hours. Anemia Panel: No results for input(s): VITAMINB12, FOLATE, FERRITIN, TIBC, IRON, RETICCTPCT in the last 72 hours. Urine analysis:    Component Value Date/Time   COLORURINE YELLOW 08/28/2016 0339   APPEARANCEUR CLEAR 08/28/2016 0339   LABSPEC 1.017 08/28/2016 0339   LABSPEC 1.015 03/25/2014   PHURINE 7.0 08/28/2016 0339   GLUCOSEU NEGATIVE 08/28/2016 0339   GLUCOSEU NEGATIVE 09/16/2015 0900   HGBUR NEGATIVE 08/28/2016 0339   BILIRUBINUR NEGATIVE 08/28/2016 0339   KETONESUR NEGATIVE 08/28/2016 0339   PROTEINUR NEGATIVE 08/28/2016 0339   UROBILINOGEN 0.2  09/16/2015 0900   NITRITE NEGATIVE 08/28/2016 0339   LEUKOCYTESUR NEGATIVE 08/28/2016 0339   LEUKOCYTESUR negative 03/25/2014    Creatinine Clearance: Estimated Creatinine Clearance: 73.4 mL/min (by C-G formula based on SCr of 1.31 mg/dL (H)).  Sepsis Labs: @LABRCNTIP (procalcitonin:4,lacticidven:4) ) Recent Results (from the past 240 hour(s))  Blood Culture (routine x 2)     Status: None (Preliminary result)   Collection Time: 08/28/16  5:07 AM  Result Value Ref Range Status   Specimen Description BLOOD RIGHT ARM  Final   Special Requests BOTTLES DRAWN AEROBIC AND ANAEROBIC 10CC  Final   Culture PENDING  Incomplete   Report Status PENDING  Incomplete     Radiological Exams on Admission: Dg Chest 2 View  Result Date: 08/28/2016 CLINICAL DATA:  Syncopal episode today. EXAM: CHEST  2 VIEW COMPARISON:  01/12/2015 FINDINGS: Patchy opacity in the right upper lobe inferiorly may represent early pneumonia, aspiration, pulmonary hemorrhage. Left lung is clear. No pleural effusions. Normal heart size. Normal pulmonary vasculature. IMPRESSION: Right upper lobe airspace opacity, possibly early pneumonia. Followup PA and lateral chest X-ray is recommended in 3-4 weeks  following trial of antibiotic therapy to ensure resolution and exclude underlying malignancy. Electronically Signed   By: Andreas Newport M.D.   On: 08/28/2016 05:55   Dg Elbow Complete Left  Result Date: 08/28/2016 CLINICAL DATA:  [place] out of bed at 02:45 EXAM: LEFT ELBOW - COMPLETE 3+ VIEW COMPARISON:  None. FINDINGS: There is no evidence of fracture, dislocation, or joint effusion. There is no evidence of arthropathy or other focal bone abnormality. Soft tissues are unremarkable. IMPRESSION: Negative. Electronically Signed   By: Andreas Newport M.D.   On: 08/28/2016 05:55   Ct Head Wo Contrast  Result Date: 08/28/2016 CLINICAL DATA:  [place] out of bed at 0245 hours. Amnesia to event. Constipation, vomiting and syncopal episode today. History of hypertension, hyperlipidemia and diabetes. EXAM: CT HEAD WITHOUT CONTRAST TECHNIQUE: Contiguous axial images were obtained from the base of the skull through the vertex without intravenous contrast. COMPARISON:  None. FINDINGS: BRAIN: The ventricles and sulci are normal for age. No intraparenchymal hemorrhage, mass effect nor midline shift. No acute large vascular territory infarcts. No abnormal extra-axial fluid collections. Basal cisterns are patent. VASCULAR: Minimal calcific atherosclerosis of the carotid siphons and RIGHT vertebral artery. SKULL: No skull fracture. Severe bilateral temporomandibular osteoarthrosis. No significant scalp soft tissue swelling. SINUSES/ORBITS: Trace paranasal sinus mucosal thickening. Mastoid air cells are well aerated. The included ocular globes and orbital contents are non-suspicious. OTHER: None. IMPRESSION: Negative CT HEAD. Electronically Signed   By: Elon Alas M.D.   On: 08/28/2016 04:09    EKG: Independently reviewed. Sinus tachycardia Otherwise normal ECG  Assessment/Plan Principal Problem:   Syncope Active Problems:   Type II diabetes mellitus with manifestations (HCC)   Hyperlipidemia with target LDL  less than 100   Obesity   Essential hypertension   GAD (generalized anxiety disorder)   CAP (community acquired pneumonia)   Acute kidney injury ([place])   Tachycardia   #1. Syncope. Likely related to sepsis secondary to community-acquired pneumonia in the setting of dehydration and possible related orthostatics vs vasovagal. CT of the head negative for any acute abnormality. Patient febrile but hemodynamically stable no leukocytosis or elevated lactic acid -Admit to telemetry -Obtain orthostatic vital signs -IV fluids -IV antibiotics -2-D echo for completeness -Follow lactic acid -Scheduled nebs -Follow blood cultures -Follow influenza panel  #2. Sepsis. Likely related to pneumonia. Patient is febrile tachycardia tachypnea with  acute kidney injury. He received 3.5 L of normal saline in the emergency department well as vancomycin and Zosyn. Blood pressure stable -IV antibiotics per pneumonia protocol -IV fluids -Follow lactic acid -Obtain pro-calcitonin -Follow influenza panel -Follow blood cultures -supportive therapy in form of anti-emetic, antitussive  #3. Community-acquired pneumonia. Chest x-ray right upper lobe airspace opacity concerning for early pneumonia -See #1 and #2 -Sputum cultures as able -Follow lactic acid -IV Rocephin and azithromycin per protocol -strep pneumo urine antigen -OP chest xray in 3-4 weeks  4. Acute kidney injury. creatinine 1.31 on admission. Likely related to above. She reports no history of same -Hold nephrotoxins -Vigorous IV fluids as noted above -monitor urine output -recheck in am  #4. Hypertension. Table in the emergency department. Home medications include Edarbi. -will hold home meds for now -close monitoring -resume home antihypertesive meds as indicated  #5. Diabetes. Patient is taking metformin. Serum glucose 201 on admission. -Hold metformin for now as his appetite is unreliable -Obtain a hemoglobin A1c -Sliding scale insulin  for optimal control  #6. Hyperlipidemia. -Continue statin  #7. Obesity. BMI 33.5 -nutritional consult  #8. Tachycardia. EKG as noted above. Likely related to #2 #30 chest pain. improving after fluids -monitor -continue fluids    DVT prophylaxis: lovenox  Code Status: full  Family Communication: wife at bedside  Disposition Plan: home   Consults called: none  Admission status: inpatient    Radene Gunning MD [facility]  If 7PM-7AM, please contact night-coverage [email or web address] Password [facility]  08/28/2016, 7:34 AM

## 2016-08-28 NOTE — Progress Notes (Signed)
Patient arrived on the unit from the ED, assesment completed see flowsheet, placed on tele ccmd notified, patient oriented to room and staff, bed in lowest position, call light within reach will continue to monitor

## 2016-08-28 NOTE — ED Notes (Signed)
Bladder scanner not working.

## 2016-08-29 ENCOUNTER — Other Ambulatory Visit (HOSPITAL_COMMUNITY)

## 2016-08-29 DIAGNOSIS — F411 Generalized anxiety disorder: Secondary | ICD-10-CM

## 2016-08-29 DIAGNOSIS — E785 Hyperlipidemia, unspecified: Secondary | ICD-10-CM

## 2016-08-29 DIAGNOSIS — J111 Influenza due to unidentified influenza virus with other respiratory manifestations: Secondary | ICD-10-CM

## 2016-08-29 LAB — CBC
HCT: 39.7 % (ref 39.0–52.0)
Hemoglobin: 13.4 g/dL (ref 13.0–17.0)
MCH: 30 pg (ref 26.0–34.0)
MCHC: 33.8 g/dL (ref 30.0–36.0)
MCV: 88.8 fL (ref 78.0–100.0)
PLATELETS: 133 10*3/uL — AB (ref 150–400)
RBC: 4.47 MIL/uL (ref 4.22–5.81)
RDW: 13.3 % (ref 11.5–15.5)
WBC: 9.6 10*3/uL (ref 4.0–10.5)

## 2016-08-29 LAB — HEMOGLOBIN A1C
HEMOGLOBIN A1C: 6.7 % — AB (ref 4.8–5.6)
MEAN PLASMA GLUCOSE: 146 mg/dL

## 2016-08-29 LAB — GLUCOSE, CAPILLARY
GLUCOSE-CAPILLARY: 131 mg/dL — AB (ref 65–99)
Glucose-Capillary: 98 mg/dL (ref 65–99)
Glucose-Capillary: 106 mg/dL — ABNORMAL HIGH (ref 65–99)
Glucose-Capillary: 146 mg/dL — ABNORMAL HIGH (ref 65–99)

## 2016-08-29 LAB — URINE CULTURE: Culture: NO GROWTH

## 2016-08-29 LAB — BASIC METABOLIC PANEL
Anion gap: 7 (ref 5–15)
BUN: 11 mg/dL (ref 6–20)
CALCIUM: 8.1 mg/dL — AB (ref 8.9–10.3)
CHLORIDE: 103 mmol/L (ref 101–111)
CO2: 26 mmol/L (ref 22–32)
Creatinine, Ser: 1.17 mg/dL (ref 0.61–1.24)
GFR calc non Af Amer: >60 mL/min (ref >60)
GLUCOSE: 136 mg/dL — AB (ref 65–99)
Potassium: 3.6 mmol/L (ref 3.5–5.1)
Sodium: 136 mmol/L (ref 135–145)

## 2016-08-29 LAB — STREP PNEUMONIAE URINARY ANTIGEN: Strep Pneumo Urinary Antigen: NEGATIVE

## 2016-08-29 LAB — HIV ANTIBODY (ROUTINE TESTING W REFLEX): HIV Screen 4th Generation wRfx: NONREACTIVE

## 2016-08-29 MED ORDER — ALPRAZOLAM 0.5 MG PO TABS
0.5000 mg | ORAL_TABLET | Freq: Every evening | ORAL | Status: DC | PRN
  Administered 2016-08-29: 0.5 mg via ORAL
  Filled 2016-08-29: qty 1

## 2016-08-29 MED ORDER — LORAZEPAM 0.5 MG PO TABS
0.5000 mg | ORAL_TABLET | Freq: Once | ORAL | Status: DC
  Filled 2016-08-29 (×2): qty 1

## 2016-08-29 MED ORDER — SODIUM CHLORIDE 0.9 % IV SOLN
INTRAVENOUS | Status: AC
  Administered 2016-08-29: 13:00:00 via INTRAVENOUS

## 2016-08-29 NOTE — Care Management Note (Signed)
Case Management Note  Patient Details  Name: Brandon Shannon MRN: [id number] Date of Birth: 1955/10/17  Subjective/Objective:  Pt presented for syncope. Pt is from home with wife. Pt has PCP Dr. Janith Lima local and he sees MD Dierdre Searles at the [facility]. Pt has [employer] for insurance. Pt gets his medications from [facility] local pharmacy and [place] for free.                 Action/Plan: CM received call from patients wife in regards to making the [place] aware that pt is hospitalized. CM did place a call to the [place] in regards to being hospitalized and to place in the system. CM will continue to monitor for additional needs.   Expected Discharge Date:                  Expected Discharge Plan:  Home/Self Care  In-House Referral:  NA  Discharge planning Services  CM Consult  Post Acute Care Choice:    Choice offered to:     DME Arranged:    DME Agency:     HH Arranged:    HH Agency:     Status of Service:  In process, will continue to follow  If discussed at Long Length of Stay Meetings, dates discussed:    Additional Comments:  Bethena Roys, RN 08/29/2016, 2:19 PM

## 2016-08-29 NOTE — Progress Notes (Signed)
PROGRESS NOTE    Brandon Shannon  [id number] DOB: [birth year] DOA: 08/28/2016 PCP: Scarlette Calico, MD   Brief Narrative:  Brandon Shannon is a very pleasnat 61 y.o. male with medical history significant of DM on metformin, gerd,hypertension presents to the emergency department with chief complaint of syncope and a five-day history of cough congestion fever. Initial evaluation reveals sepsis likely related to community-acquired pneumonia in the setting of dehydration  Information is obtained from the patient and his wife is at the bedside. He reports for the last 5 days she's experienced frequent wet nonproductive cough intermittent fever chills decreased oral intake. Last week he had 2 episodes of emesis and this morning after his syncopal event wife reports emesis. He denied any coffee ground emesis. He awakened this morning goes to the restroom. When he stood up from the commode he experienced syncope falling to the floor. He has no memory of the incident. Wife reports hearing him hit the ground. He denies headache dizziness chest pain palpitations. He does report some increased shortness of breath with exertion over the last couple of days. He denies abdominal pain lower extremity edema numbness tingling of his extremities. He denies dysuria hematuria frequency or urgency.  The emergency department he has rectal temp of 102.2, he's tachycardia with mild tachypnea blood pressure stable. Provided with IV fluids vancomycin and Zosyn.   Assessment & Plan:   Principal Problem:   Syncope Active Problems:   Type II diabetes mellitus with manifestations (HCC)   Hyperlipidemia with target LDL less than 100   Obesity   Essential hypertension   GAD (generalized anxiety disorder)   CAP (community acquired pneumonia)   Acute kidney injury ([place])   Tachycardia   Influenza   Syncope - Likely related to sepsis secondary to community-acquired pneumonia, influenza in the setting of dehydration and  possible related orthostatics vs vasovagal - CT of the head negative for any acute abnormality - No arrhythmia noted on telemetry -Obtain orthostatic vital signs -IV fluids -IV antibiotics -2-D echo pending -Follow lactic acid -Scheduled nebs -Follow blood cultures (currently incubating) - Influenza A positive  Sepsis - Likely related to influenza and possibly pneumonia - febrile tachycardia tachypnea with acute kidney injury - Received 3.5 L of normal saline in the emergency department well as vancomycin and Zosyn -IV antibiotics per pneumonia protocol and for influenza -IV fluids - pro-calcitonin of 0.23 - Influenza A positive - Blood cultures pending -supportive therapy in form of anti-emetic, antitussive  Community-acquired pneumonia - Chest x-ray right upper lobe airspace opacity concerning for early pneumonia -Sputum cultures as able -IV Rocephin and azithromycin per protocol -strep pneumo urine antigen -OP chest xray in 3-4 weeks  Acute kidney injury - Creatinine 1.31 on admission  - Creatinine of 1.17 today -Hold nephrotoxins -Vigorous IV fluids given in ED -monitor urine output (UOP only 423ml yesterday)  Hypertension. Table in the emergency department. Home medications include Edarbi. -will continue to hold home meds for now -close monitoring (BP well controlled right now) -resume home antihypertesive meds as indicated  Diabetes - Patient is taking metformin - Serum glucose 201 on admission. -Hold metformin for now as his appetite is unreliable - HgA1c of 6.7 -Sliding scale insulin for optimal control  Hyperlipidemia. -Continue statin  Obesity - BMI 33.5 -nutritional consult  Tachycardia - EKG as noted above - improved this am -monitor -continue fluids at 150ml/hr x 10 hours    DVT prophylaxis: lovenox  Code Status: full  Family Communication: wife at  bedside  Disposition Plan: home     Consultants:   None  Procedures:    None  Antimicrobials:   Ceftriaxone 08/28/16>  Azithromycin 08/28/16>  Tamiflu 08/28/16>  Zosyn 08/28/16 x 1 dose  Vancomycin 08/28/16 x 1 dose   Subjective: Patient is laying in bed.  Wife is bedside.  Patient asking appropriate questions about treatment plan.  Says he is feeling better this am and is wondering when he could possibly go home.  Has been able to ambulate to and from the bathroom.    Objective: Vitals:   08/28/16 2036 08/28/16 2100 08/29/16 0500 08/29/16 0900  BP:  114/65 (!) 162/88 137/74  Pulse:  93 84 80  Resp:  18 20 20   Temp: 99 F (37.2 C) 99.2 F (37.3 C) 97.6 F (36.4 C) 98.5 F (36.9 C)  TempSrc: Oral Oral Oral Oral  SpO2:  97% 100% 97%  Weight:   105.4 kg (232 lb 6.4 oz)   Height:        Intake/Output Summary (Last 24 hours) at 08/29/16 1158 Last data filed at 08/29/16 0800  Gross per 24 hour  Intake             1645 ml  Output              400 ml  Net             1245 ml   Filed Weights   08/28/16 0330 08/28/16 0859 08/29/16 0500  Weight: 108.9 kg (240 lb) 107.4 kg (236 lb 11.2 oz) 105.4 kg (232 lb 6.4 oz)    Examination:  General exam: Appears calm and comfortable  Respiratory system: Respiratory effort normal. Few scattered crackles in right upper lung field Cardiovascular system: S1 & S2 heard, RRR. No JVD, murmurs, rubs, gallops or clicks. No pedal edema. Gastrointestinal system: Abdomen is nondistended, soft and nontender. No organomegaly or masses felt. Normal bowel sounds heard. Central nervous system: Alert and oriented. No focal neurological deficits. Extremities: Symmetric 5 x 5 power. Skin: No rashes, lesions or ulcers Psychiatry: Judgement and insight appear normal. Mood & affect appropriate.     Data Reviewed: I have personally reviewed following labs and imaging studies  CBC:  Recent Labs Lab 08/28/16 0331 08/29/16 0240  WBC 9.0 9.6  HGB 14.8 13.4  HCT 44.2 39.7  MCV 90.4 88.8  PLT 153 [id number]*   Basic Metabolic  Panel:  Recent Labs Lab 08/28/16 0331 08/29/16 0240  NA 135 136  K 3.8 3.6  CL 100* 103  CO2 25 26  GLUCOSE 201* 136*  BUN 10 11  CREATININE 1.31* 1.17  CALCIUM 8.9 8.1*   GFR: Estimated Creatinine Clearance: 79.6 mL/min (by C-G formula based on SCr of 1.17 mg/dL). Liver Function Tests:  Recent Labs Lab 08/28/16 0331  AST 25  ALT 33  ALKPHOS 78  BILITOT 0.8  PROT 7.5  ALBUMIN 3.4*    Recent Labs Lab 08/28/16 0331  LIPASE 36   No results for input(s): AMMONIA in the last 168 hours. Coagulation Profile:  Recent Labs Lab 08/28/16 0944  INR 1.14   Cardiac Enzymes: No results for input(s): CKTOTAL, CKMB, CKMBINDEX, TROPONINI in the last 168 hours. BNP (last 3 results) No results for input(s): PROBNP in the last 8760 hours. HbA1C:  Recent Labs  08/28/16 0944  HGBA1C 6.7*   CBG:  Recent Labs Lab 08/28/16 1146 08/28/16 1627 08/28/16 2125 08/29/16 0544 08/29/16 1114  GLUCAP 195* 132* 121* 131* 98  Lipid Profile: No results for input(s): CHOL, HDL, LDLCALC, TRIG, CHOLHDL, LDLDIRECT in the last 72 hours. Thyroid Function Tests: No results for input(s): TSH, T4TOTAL, FREET4, T3FREE, THYROIDAB in the last 72 hours. Anemia Panel: No results for input(s): VITAMINB12, FOLATE, FERRITIN, TIBC, IRON, RETICCTPCT in the last 72 hours. Sepsis Labs:  Recent Labs Lab 08/28/16 0441 08/28/16 0944 08/28/16 1325  PROCALCITON  --  0.23  --   LATICACIDVEN 1.73 1.9 1.7    Recent Results (from the past 240 hour(s))  Urine culture     Status: None   Collection Time: 08/28/16  3:39 AM  Result Value Ref Range Status   Specimen Description URINE, RANDOM  Final   Special Requests ADDED 0504  Final   Culture NO GROWTH  Final   Report Status 08/29/2016 FINAL  Final  Blood Culture (routine x 2)     Status: None (Preliminary result)   Collection Time: 08/28/16  5:07 AM  Result Value Ref Range Status   Specimen Description BLOOD RIGHT ARM  Final   Special Requests  BOTTLES DRAWN AEROBIC AND ANAEROBIC 10CC  Final   Culture PENDING  Incomplete   Report Status PENDING  Incomplete         Radiology Studies: Dg Chest 2 View  Result Date: 08/28/2016 CLINICAL DATA:  Syncopal episode today. EXAM: CHEST  2 VIEW COMPARISON:  01/12/2015 FINDINGS: Patchy opacity in the right upper lobe inferiorly may represent early pneumonia, aspiration, pulmonary hemorrhage. Left lung is clear. No pleural effusions. Normal heart size. Normal pulmonary vasculature. IMPRESSION: Right upper lobe airspace opacity, possibly early pneumonia. Followup PA and lateral chest X-ray is recommended in 3-4 weeks following trial of antibiotic therapy to ensure resolution and exclude underlying malignancy. Electronically Signed   By: Andreas Newport M.D.   On: 08/28/2016 05:55   Dg Elbow Complete Left  Result Date: 08/28/2016 CLINICAL DATA:  [place] out of bed at 02:45 EXAM: LEFT ELBOW - COMPLETE 3+ VIEW COMPARISON:  None. FINDINGS: There is no evidence of fracture, dislocation, or joint effusion. There is no evidence of arthropathy or other focal bone abnormality. Soft tissues are unremarkable. IMPRESSION: Negative. Electronically Signed   By: Andreas Newport M.D.   On: 08/28/2016 05:55   Ct Head Wo Contrast  Result Date: 08/28/2016 CLINICAL DATA:  [place] out of bed at 0245 hours. Amnesia to event. Constipation, vomiting and syncopal episode today. History of hypertension, hyperlipidemia and diabetes. EXAM: CT HEAD WITHOUT CONTRAST TECHNIQUE: Contiguous axial images were obtained from the base of the skull through the vertex without intravenous contrast. COMPARISON:  None. FINDINGS: BRAIN: The ventricles and sulci are normal for age. No intraparenchymal hemorrhage, mass effect nor midline shift. No acute large vascular territory infarcts. No abnormal extra-axial fluid collections. Basal cisterns are patent. VASCULAR: Minimal calcific atherosclerosis of the carotid siphons and RIGHT vertebral artery.  SKULL: No skull fracture. Severe bilateral temporomandibular osteoarthrosis. No significant scalp soft tissue swelling. SINUSES/ORBITS: Trace paranasal sinus mucosal thickening. Mastoid air cells are well aerated. The included ocular globes and orbital contents are non-suspicious. OTHER: None. IMPRESSION: Negative CT HEAD. Electronically Signed   By: Elon Alas M.D.   On: 08/28/2016 04:09        Scheduled Meds: . azithromycin  500 mg Oral Q24H  . cefTRIAXone (ROCEPHIN)  IV  1 g Intravenous Q24H  . enoxaparin (LOVENOX) injection  40 mg Subcutaneous Q24H  . insulin aspart  0-15 Units Subcutaneous TID WC  . insulin aspart  0-5 Units Subcutaneous QHS  .  LORazepam  0.5 mg Oral Once  . oseltamivir  75 mg Oral BID  . rosuvastatin  40 mg Oral Daily  . sodium chloride  1,000 mL Intravenous Once   And  . sodium chloride  500 mL Intravenous Once  . sodium chloride flush  3 mL Intravenous Q12H   Continuous Infusions:   LOS: 1 day    Time spent: 30 minutes    Loretha Stapler, MD [facility] Pager [phone]  If 7PM-7AM, please contact night-coverage [email or web address] Password [email or web address] 08/29/2016, 11:58 AM

## 2016-08-30 ENCOUNTER — Inpatient Hospital Stay (HOSPITAL_COMMUNITY)

## 2016-08-30 DIAGNOSIS — R55 Syncope and collapse: Secondary | ICD-10-CM

## 2016-08-30 LAB — ECHOCARDIOGRAM COMPLETE
Height: 70 in
Weight: 3672 oz

## 2016-08-30 LAB — GLUCOSE, CAPILLARY
GLUCOSE-CAPILLARY: 132 mg/dL — AB (ref 65–99)
Glucose-Capillary: 153 mg/dL — ABNORMAL HIGH (ref 65–99)

## 2016-08-30 MED ORDER — CEPHALEXIN 500 MG PO CAPS: 500.0000 mg | ORAL_CAPSULE | Freq: Four times a day (QID) | ORAL | 0 refills | Status: AC

## 2016-08-30 MED ORDER — CEPHALEXIN 500 MG PO CAPS
500.0000 mg | ORAL_CAPSULE | Freq: Four times a day (QID) | ORAL | Status: DC
  Administered 2016-08-30: 500 mg via ORAL
  Filled 2016-08-30: qty 1

## 2016-08-30 MED ORDER — OSELTAMIVIR PHOSPHATE 75 MG PO CAPS: 75.0000 mg | ORAL_CAPSULE | Freq: Two times a day (BID) | ORAL | 0 refills | Status: AC

## 2016-08-30 MED ORDER — CEPHALEXIN 500 MG PO CAPS: 500.0000 mg | ORAL_CAPSULE | Freq: Two times a day (BID) | ORAL | Status: DC

## 2016-08-30 MED ORDER — AZITHROMYCIN 500 MG PO TABS: 500.0000 mg | ORAL_TABLET | ORAL | 0 refills | Status: AC

## 2016-08-30 NOTE — Discharge Summary (Signed)
Physician Discharge Summary  SALADIN LAFOY [id number] DOB: 1955/10/29 DOA: 08/28/2016  PCP: Scarlette Calico, MD  Admit date: 08/28/2016 Discharge date: 08/30/2016  Admitted From: home Disposition:  home  Recommendations for Outpatient Follow-up:  1. Follow up with PCP in 1-2 weeks 2. Please follow up on the following pending results: final blood culture results  3. Recommend repeat outpatient chest xray in 3-4 weeks  Home Health: no  Equipment/Devices: none   Discharge Condition: stable CODE STATUS: full  Diet recommendation: heart healthy   Brief/Interim Summary: Rolen A Coleis a very pleasant 61 y.o.malewith medical history significant of DM on metformin, GERD, hypertension who presented to the emergency department with chief complaint of syncope and a five-day history of cough, congestion, fever. Initial evaluation revealed sepsis likely related to community-acquired pneumonia in the setting of dehydration. Influenza screening returned positive. He was treated with Rocephin, Zithromax as well as Tamiflu. He underwent syncope workup which was unremarkable.  Subjective on day of discharge: Feeling well today. No complaints. Denies any dizziness, chest pain, shortness of breath, nausea, vomiting or abdominal pain. Tolerating meals and ambulating.  Discharge Diagnoses:  Principal Problem:   Syncope Active Problems:   Type II diabetes mellitus with manifestations (HCC)   Hyperlipidemia with target LDL less than 100   Obesity   Essential hypertension   GAD (generalized anxiety disorder)   CAP (community acquired pneumonia)   Acute kidney injury ([place])   Tachycardia   Influenza  Syncope -Likely related to sepsis secondary to community-acquired pneumonia, influenzain the setting of dehydration and possible related orthostatics vs vasovagal -CT of the head negative for any acute abnormality -No arrhythmia noted on telemetry  -Orthostatic VS negative  -2-D echo unremarkable  valves   Sepsis secondary to RUL CAP and influenza  -Streppneumo urine antigen negative  -Blood cultures pending, negative at time of discharge  -Deescalate antibiotics to keflex and azithromax. Continue tamiflu  -Repeat outpatient chest xray in 3-4 weeks  Acute kidney injury -Creatinine1.31 on admission, resolved with IVF  -UA negative  -Hold nephrotoxins  Hypertension -Continue home meds   Diabetes type 2, well controlled -Ha1c 6.7  -Hold home metformin -Sliding scale insulin  Hyperlipidemia -Continue crestor   Discharge Instructions  Discharge Instructions    Diet - low sodium heart healthy    Complete by:  As directed    Increase activity slowly    Complete by:  As directed      Allergies as of 08/30/2016   No Known Allergies     Medication List    TAKE these medications   Azilsartan Medoxomil 40 MG Tabs Commonly known as:  EDARBI Take 1 tablet by mouth daily.   azithromycin 500 MG tablet Commonly known as:  ZITHROMAX Take 1 tablet (500 mg total) by mouth daily. Start taking on:  08/31/2016   cephALEXin 500 MG capsule Commonly known as:  KEFLEX Take 1 capsule (500 mg total) by mouth every 6 (six) hours.   hydrOXYzine 10 MG tablet Commonly known as:  ATARAX/VISTARIL Take 20 mg by mouth at bedtime as needed for itching.   metFORMIN 750 MG 24 hr tablet Commonly known as:  GLUCOPHAGE XR Take 2 tablets (1,500 mg total) by mouth daily with breakfast.   oseltamivir 75 MG capsule Commonly known as:  TAMIFLU Take 1 capsule (75 mg total) by mouth 2 (two) times daily.   rosuvastatin 40 MG tablet Commonly known as:  CRESTOR Take 40 mg by mouth daily.      Follow-up  Information    Scarlette Calico, MD. Schedule an appointment as soon as possible for a visit in 1 week(s).   Specialty:  Internal Medicine Contact information: [address] [place] [address] [phone]          No Known  Allergies  Consultations:  none   Procedures/Studies: Dg Chest 2 View  Result Date: 08/28/2016 CLINICAL DATA:  Syncopal episode today. EXAM: CHEST  2 VIEW COMPARISON:  01/12/2015 FINDINGS: Patchy opacity in the right upper lobe inferiorly may represent early pneumonia, aspiration, pulmonary hemorrhage. Left lung is clear. No pleural effusions. Normal heart size. Normal pulmonary vasculature. IMPRESSION: Right upper lobe airspace opacity, possibly early pneumonia. Followup PA and lateral chest X-ray is recommended in 3-4 weeks following trial of antibiotic therapy to ensure resolution and exclude underlying malignancy. Electronically Signed   By: Andreas Newport M.D.   On: 08/28/2016 05:55   Dg Elbow Complete Left  Result Date: 08/28/2016 CLINICAL DATA:  [place] out of bed at 02:45 EXAM: LEFT ELBOW - COMPLETE 3+ VIEW COMPARISON:  None. FINDINGS: There is no evidence of fracture, dislocation, or joint effusion. There is no evidence of arthropathy or other focal bone abnormality. Soft tissues are unremarkable. IMPRESSION: Negative. Electronically Signed   By: Andreas Newport M.D.   On: 08/28/2016 05:55   Ct Head Wo Contrast  Result Date: 08/28/2016 CLINICAL DATA:  [place] out of bed at 0245 hours. Amnesia to event. Constipation, vomiting and syncopal episode today. History of hypertension, hyperlipidemia and diabetes. EXAM: CT HEAD WITHOUT CONTRAST TECHNIQUE: Contiguous axial images were obtained from the base of the skull through the vertex without intravenous contrast. COMPARISON:  None. FINDINGS: BRAIN: The ventricles and sulci are normal for age. No intraparenchymal hemorrhage, mass effect nor midline shift. No acute large vascular territory infarcts. No abnormal extra-axial fluid collections. Basal cisterns are patent. VASCULAR: Minimal calcific atherosclerosis of the carotid siphons and RIGHT vertebral artery. SKULL: No skull fracture. Severe bilateral temporomandibular osteoarthrosis. No significant  scalp soft tissue swelling. SINUSES/ORBITS: Trace paranasal sinus mucosal thickening. Mastoid air cells are well aerated. The included ocular globes and orbital contents are non-suspicious. OTHER: None. IMPRESSION: Negative CT HEAD. Electronically Signed   By: Elon Alas M.D.   On: 08/28/2016 04:09    Echo Study Conclusions  - Left ventricle: The cavity size was normal. Wall thickness was   increased in a pattern of mild LVH. Systolic function was normal.   The estimated ejection fraction was in the range of 55% to 60%.   Wall motion was normal; there were no regional wall motion   abnormalities. Features are consistent with a pseudonormal left   ventricular filling pattern, with concomitant abnormal relaxation   and increased filling pressure (grade 2 diastolic dysfunction). - Aortic valve: There was no stenosis. - Mitral valve: There was trivial regurgitation. - Right ventricle: The cavity size was normal. Systolic function   was normal. - Tricuspid valve: Peak RV-RA gradient (S): 20 mm Hg. - Pulmonary arteries: PA peak pressure: 23 mm Hg (S). - Inferior vena cava: The vessel was normal in size. The   respirophasic diameter changes were in the normal range (>= 50%),   consistent with normal central venous pressure.  Impressions:  - Normal LV size with mild LV hypertrophy. EF 55-60%. Moderate   diastolic dysfunction. Normal RV size and systolic function. No   significant valvular abnormalities.   Discharge Exam: Vitals:   08/30/16 0312 08/30/16 1343  BP: 125/69 137/83  Pulse:  82 84  Resp: 18 20  Temp: 98.2 F (36.8 C) 98.3 F (36.8 C)   Vitals:   08/29/16 2000 08/30/16 0312 08/30/16 0500 08/30/16 1343  BP: 113/69 125/69  137/83  Pulse: 84 82  84  Resp: 18 18  20   Temp: 98.5 F (36.9 C) 98.2 F (36.8 C)  98.3 F (36.8 C)  TempSrc: Oral Oral  Oral  SpO2: 98% 96%  97%  Weight:   104.1 kg (229 lb 8 oz)   Height:        General: Pt is alert, awake, not in  acute distress Cardiovascular: RRR, S1/S2 +, no rubs, no gallops Respiratory: CTA bilaterally, no wheezing, no rhonchi Abdominal: Soft, NT, ND, bowel sounds + Extremities: no edema, no cyanosis    The results of significant diagnostics from this hospitalization (including imaging, microbiology, ancillary and laboratory) are listed below for reference.     Microbiology: Recent Results (from the past 240 hour(s))  Urine culture     Status: None   Collection Time: 08/28/16  3:39 AM  Result Value Ref Range Status   Specimen Description URINE, RANDOM  Final   Special Requests ADDED 0504  Final   Culture NO GROWTH  Final   Report Status 08/29/2016 FINAL  Final  Blood Culture (routine x 2)     Status: None (Preliminary result)   Collection Time: 08/28/16  5:00 AM  Result Value Ref Range Status   Specimen Description BLOOD LEFT ARM  Final   Special Requests IN PEDIATRIC BOTTLE 4CC  Final   Culture NO GROWTH 1 DAY  Final   Report Status PENDING  Incomplete  Blood Culture (routine x 2)     Status: None (Preliminary result)   Collection Time: 08/28/16  5:07 AM  Result Value Ref Range Status   Specimen Description BLOOD RIGHT ARM  Final   Special Requests BOTTLES DRAWN AEROBIC AND ANAEROBIC 10CC  Final   Culture NO GROWTH 1 DAY  Final   Report Status PENDING  Incomplete     Labs: BNP (last 3 results) No results for input(s): BNP in the last 8760 hours. Basic Metabolic Panel:  Recent Labs Lab 08/28/16 0331 08/29/16 0240  NA 135 136  K 3.8 3.6  CL 100* 103  CO2 25 26  GLUCOSE 201* 136*  BUN 10 11  CREATININE 1.31* 1.17  CALCIUM 8.9 8.1*   Liver Function Tests:  Recent Labs Lab 08/28/16 0331  AST 25  ALT 33  ALKPHOS 78  BILITOT 0.8  PROT 7.5  ALBUMIN 3.4*    Recent Labs Lab 08/28/16 0331  LIPASE 36   No results for input(s): AMMONIA in the last 168 hours. CBC:  Recent Labs Lab 08/28/16 0331 08/29/16 0240  WBC 9.0 9.6  HGB 14.8 13.4  HCT 44.2 39.7   MCV 90.4 88.8  PLT 153 133*   Cardiac Enzymes: No results for input(s): CKTOTAL, CKMB, CKMBINDEX, TROPONINI in the last 168 hours. BNP: Invalid input(s): POCBNP CBG:  Recent Labs Lab 08/29/16 1114 08/29/16 1614 08/29/16 2138 08/30/16 0622 08/30/16 1124  GLUCAP 98 106* 146* 132* 153*   D-Dimer No results for input(s): DDIMER in the last 72 hours. Hgb A1c  Recent Labs  08/28/16 0944  HGBA1C 6.7*   Lipid Profile No results for input(s): CHOL, HDL, LDLCALC, TRIG, CHOLHDL, LDLDIRECT in the last 72 hours. Thyroid function studies No results for input(s): TSH, T4TOTAL, T3FREE, THYROIDAB in the last 72 hours.  Invalid input(s): FREET3 Anemia work up  No results for input(s): VITAMINB12, FOLATE, FERRITIN, TIBC, IRON, RETICCTPCT in the last 72 hours. Urinalysis    Component Value Date/Time   COLORURINE YELLOW 08/28/2016 0339   APPEARANCEUR CLEAR 08/28/2016 0339   LABSPEC 1.017 08/28/2016 0339   LABSPEC 1.015 03/25/2014   PHURINE 7.0 08/28/2016 0339   GLUCOSEU NEGATIVE 08/28/2016 0339   GLUCOSEU NEGATIVE 09/16/2015 0900   HGBUR NEGATIVE 08/28/2016 0339   BILIRUBINUR NEGATIVE 08/28/2016 0339   KETONESUR NEGATIVE 08/28/2016 0339   PROTEINUR NEGATIVE 08/28/2016 0339   UROBILINOGEN 0.2 09/16/2015 0900   NITRITE NEGATIVE 08/28/2016 0339   LEUKOCYTESUR NEGATIVE 08/28/2016 0339   LEUKOCYTESUR negative 03/25/2014   Sepsis Labs Invalid input(s): PROCALCITONIN,  WBC,  LACTICIDVEN Microbiology Recent Results (from the past 240 hour(s))  Urine culture     Status: None   Collection Time: 08/28/16  3:39 AM  Result Value Ref Range Status   Specimen Description URINE, RANDOM  Final   Special Requests ADDED 0504  Final   Culture NO GROWTH  Final   Report Status 08/29/2016 FINAL  Final  Blood Culture (routine x 2)     Status: None (Preliminary result)   Collection Time: 08/28/16  5:00 AM  Result Value Ref Range Status   Specimen Description BLOOD LEFT ARM  Final   Special  Requests IN PEDIATRIC BOTTLE 4CC  Final   Culture NO GROWTH 1 DAY  Final   Report Status PENDING  Incomplete  Blood Culture (routine x 2)     Status: None (Preliminary result)   Collection Time: 08/28/16  5:07 AM  Result Value Ref Range Status   Specimen Description BLOOD RIGHT ARM  Final   Special Requests BOTTLES DRAWN AEROBIC AND ANAEROBIC 10CC  Final   Culture NO GROWTH 1 DAY  Final   Report Status PENDING  Incomplete     Time coordinating discharge: Over 30 minutes  SIGNED:  Dessa Phi, DO [facility] Pager [phone]  If 7PM-7AM, please contact night-coverage [email or web address] Password [email or web address] 08/30/2016, 2:52 PM

## 2016-08-30 NOTE — Progress Notes (Signed)
  Echocardiogram 2D Echocardiogram has been performed.  Brandon Shannon M 08/30/2016, 8:34 AM

## 2016-08-30 NOTE — Progress Notes (Signed)
Patient verbalized understanding of discharge instructions and is able to teach back information. Patient provided with masks to wear at home until he finished his order of tamiflu. Patient's IV and telemetry removed. Patient's wife and son at bedside to drive patient home. Patient taken down in wheelchair by nursing staff.

## 2016-09-02 LAB — CULTURE, BLOOD (ROUTINE X 2)
CULTURE: NO GROWTH
CULTURE: NO GROWTH

## 2016-09-07 ENCOUNTER — Ambulatory Visit (INDEPENDENT_AMBULATORY_CARE_PROVIDER_SITE_OTHER)
Admission: RE | Admit: 2016-09-07 | Discharge: 2016-09-07 | Disposition: A | Discharge status: Home / Self Care | Source: Ambulatory Visit | Attending: Internal Medicine | Admitting: Internal Medicine

## 2016-09-07 ENCOUNTER — Encounter: Payer: Self-pay | Admitting: Internal Medicine

## 2016-09-07 ENCOUNTER — Ambulatory Visit (INDEPENDENT_AMBULATORY_CARE_PROVIDER_SITE_OTHER): Admitting: Internal Medicine

## 2016-09-07 ENCOUNTER — Other Ambulatory Visit (INDEPENDENT_AMBULATORY_CARE_PROVIDER_SITE_OTHER)

## 2016-09-07 VITALS — BP 118/90 | HR 80 | Temp 98.0°F | Resp 16 | Ht 70.0 in | Wt 227.5 lb

## 2016-09-07 DIAGNOSIS — E118 Type 2 diabetes mellitus with unspecified complications: Secondary | ICD-10-CM | POA: Diagnosis not present

## 2016-09-07 DIAGNOSIS — J181 Lobar pneumonia, unspecified organism: Secondary | ICD-10-CM

## 2016-09-07 DIAGNOSIS — N138 Other obstructive and reflux uropathy: Secondary | ICD-10-CM

## 2016-09-07 DIAGNOSIS — E785 Hyperlipidemia, unspecified: Secondary | ICD-10-CM | POA: Diagnosis not present

## 2016-09-07 DIAGNOSIS — R778 Other specified abnormalities of plasma proteins: Secondary | ICD-10-CM

## 2016-09-07 DIAGNOSIS — R202 Paresthesia of skin: Secondary | ICD-10-CM

## 2016-09-07 DIAGNOSIS — J189 Pneumonia, unspecified organism: Secondary | ICD-10-CM

## 2016-09-07 DIAGNOSIS — N401 Enlarged prostate with lower urinary tract symptoms: Secondary | ICD-10-CM

## 2016-09-07 DIAGNOSIS — I10 Essential (primary) hypertension: Secondary | ICD-10-CM

## 2016-09-07 DIAGNOSIS — Z0000 Encounter for general adult medical examination without abnormal findings: Secondary | ICD-10-CM

## 2016-09-07 DIAGNOSIS — N410 Acute prostatitis: Secondary | ICD-10-CM | POA: Insufficient documentation

## 2016-09-07 LAB — URINALYSIS, ROUTINE W REFLEX MICROSCOPIC
Bilirubin Urine: NEGATIVE
Hgb urine dipstick: NEGATIVE
KETONES UR: NEGATIVE
LEUKOCYTES UA: NEGATIVE
NITRITE: NEGATIVE
PH: 5.5 (ref 5.0–8.0)
Specific Gravity, Urine: 1.02 (ref 1.000–1.030)
TOTAL PROTEIN, URINE-UPE24: NEGATIVE
URINE GLUCOSE: NEGATIVE
UROBILINOGEN UA: 0.2 (ref 0.0–1.0)

## 2016-09-07 LAB — COMPREHENSIVE METABOLIC PANEL
ALT: 28 U/L (ref 0–53)
AST: 20 U/L (ref 0–37)
Albumin: 4.4 g/dL (ref 3.5–5.2)
Alkaline Phosphatase: 84 U/L (ref 39–117)
BUN: 14 mg/dL (ref 6–23)
CALCIUM: 10.1 mg/dL (ref 8.4–10.5)
CO2: 29 mEq/L (ref 19–32)
Chloride: 103 mEq/L (ref 96–112)
Creatinine, Ser: 1.2 mg/dL (ref 0.40–1.50)
GFR: 78.75 mL/min (ref >60.00)
GLUCOSE: 105 mg/dL — AB (ref 70–99)
POTASSIUM: 4.4 meq/L (ref 3.5–5.1)
Sodium: 141 mEq/L (ref 135–145)
TOTAL PROTEIN: 7.6 g/dL (ref 6.0–8.3)
Total Bilirubin: 0.6 mg/dL (ref 0.2–1.2)

## 2016-09-07 LAB — MICROALBUMIN / CREATININE URINE RATIO
Creatinine,U: 214.5 mg/dL
MICROALB/CREAT RATIO: 0.4 mg/g (ref 0.0–30.0)
Microalb, Ur: 0.8 mg/dL (ref 0.0–1.9)

## 2016-09-07 LAB — LIPID PANEL
CHOL/HDL RATIO: 2
Cholesterol: 101 mg/dL (ref 0–200)
HDL: 41 mg/dL (ref >39.00)
LDL Cholesterol: 45 mg/dL (ref 0–99)
NONHDL: 59.92
Triglycerides: 77 mg/dL (ref 0.0–149.0)
VLDL: 15.4 mg/dL (ref 0.0–40.0)

## 2016-09-07 LAB — FOLATE

## 2016-09-07 LAB — PSA: PSA: 0.67 ng/mL (ref 0.10–4.00)

## 2016-09-07 LAB — VITAMIN B12: VITAMIN B 12: 680 pg/mL (ref 211–911)

## 2016-09-07 MED ORDER — TAMSULOSIN HCL 0.4 MG PO CAPS: 0.4000 mg | ORAL_CAPSULE | Freq: Every day | ORAL | 1 refills | Status: AC

## 2016-09-07 MED ORDER — SULFAMETHOXAZOLE-TRIMETHOPRIM 800-160 MG PO TABS: 1.0000 | ORAL_TABLET | Freq: Two times a day (BID) | ORAL | 0 refills | Status: AC

## 2016-09-07 MED ORDER — ASPIRIN EC 81 MG PO TBEC: 81.0000 mg | DELAYED_RELEASE_TABLET | Freq: Every day | ORAL | 3 refills | Status: DC

## 2016-09-07 NOTE — Progress Notes (Signed)
Subjective:  Patient ID: Brandon Shannon, male    DOB: 07-Jul-1956  Age: 61 y.o. MRN: [id number]  CC: Hypertension; Hyperlipidemia; Cough; Diabetes; and Annual Exam   HPI Brandon Shannon presents for a CPX.  He was recently admitted for right upper lobe pneumonia that was treated with antibiotics. He tells me that he feels much better and his only lingering symptoms is a mild, nonproductive cough which he is not treating. He has had no episodes of fever/chills/night sweats/chest pain/or hemoptysis.  His recent A1c was 6.7% his blood sugars have been well controlled but over the last few years he is developing worsening, nonpainful, numbness and tingling in his feet.  He does complain over the last few weeks of developing urinary frequency, hesitancy, and dysuria. He denies abdominal pain/flank pain/hematuria.   Outpatient Medications Prior to Visit  Medication Sig Dispense Refill  . Azilsartan Medoxomil (EDARBI) 40 MG TABS Take 1 tablet by mouth daily. 90 tablet 3  . hydrOXYzine (ATARAX/VISTARIL) 10 MG tablet Take 20 mg by mouth at bedtime as needed for itching.    . metFORMIN (GLUCOPHAGE XR) 750 MG 24 hr tablet Take 2 tablets (1,500 mg total) by mouth daily with breakfast. 180 tablet 1  . rosuvastatin (CRESTOR) 40 MG tablet Take 40 mg by mouth daily.     No facility-administered medications prior to visit.     ROS Review of Systems  Constitutional: Negative for activity change, chills, diaphoresis, fatigue and unexpected weight change.  HENT: Negative.  Negative for facial swelling, sinus pressure, trouble swallowing and voice change.   Eyes: Negative for visual disturbance.  Respiratory: Positive for cough. Negative for chest tightness, shortness of breath, wheezing and stridor.   Cardiovascular: Negative for chest pain, palpitations and leg swelling.  Gastrointestinal: Negative for abdominal pain, constipation, diarrhea, nausea and vomiting.  Endocrine: Negative for polydipsia,  polyphagia and polyuria.  Genitourinary: Positive for dysuria and frequency. Negative for decreased urine volume, difficulty urinating, discharge, enuresis, flank pain, genital sores, hematuria, penile pain, penile swelling, scrotal swelling and urgency.  Musculoskeletal: Negative.  Negative for back pain, myalgias and neck pain.  Skin: Negative.   Allergic/Immunologic: Negative.   Neurological: Negative.  Negative for dizziness, weakness, light-headedness and numbness.  Hematological: Negative.  Negative for adenopathy. Does not bruise/bleed easily.  Psychiatric/Behavioral: Negative.     Objective:  BP 118/90 (BP Location: Left Arm, Patient Position: Sitting, Cuff Size: Large)   Pulse 80   Temp 98 F (36.7 C) (Oral)   Resp 16   Ht 5\' 10"  (1.778 m)   Wt 227 lb 8 oz (103.2 kg)   SpO2 98%   BMI 32.64 kg/m   BP Readings from Last 3 Encounters:  09/07/16 118/90  08/30/16 137/83  03/06/16 124/82    Wt Readings from Last 3 Encounters:  09/07/16 227 lb 8 oz (103.2 kg)  08/30/16 229 lb 8 oz (104.1 kg)  03/06/16 236 lb (107 kg)    Physical Exam  Constitutional: He is oriented to person, place, and time. No distress.  HENT:  Mouth/Throat: Oropharynx is clear and moist. No oropharyngeal exudate.  Eyes: Conjunctivae are normal. Right eye exhibits no discharge. Left eye exhibits no discharge. No scleral icterus.  Neck: Normal range of motion. Neck supple. No JVD present. No tracheal deviation present. No thyromegaly present.  Cardiovascular: Normal rate, regular rhythm, normal heart sounds and intact distal pulses.  Exam reveals no gallop and no friction rub.   No murmur heard. Pulmonary/Chest: Effort normal and  breath sounds normal. No stridor. No respiratory distress. He has no wheezes. He has no rales. He exhibits no tenderness.  Abdominal: Soft. Bowel sounds are normal. He exhibits no distension and no mass. There is no tenderness. There is no rebound and no guarding. Hernia  confirmed negative in the right inguinal area and confirmed negative in the left inguinal area.  Genitourinary: Rectum normal and penis normal. Rectal exam shows no external hemorrhoid, no internal hemorrhoid, no fissure, no mass, no tenderness, anal tone normal and guaiac negative stool. Prostate is enlarged (1+ BPH and boginess). Prostate is not tender. Right testis shows no mass, no swelling and no tenderness. Right testis is descended. Left testis shows no mass, no swelling and no tenderness. Left testis is descended. Circumcised. No paraphimosis, penile erythema or penile tenderness. No discharge found.  Musculoskeletal: Normal range of motion. He exhibits no edema, tenderness or deformity.  Lymphadenopathy:    He has no cervical adenopathy.       Right: No inguinal adenopathy present.       Left: No inguinal adenopathy present.  Neurological: He is oriented to person, place, and time.  Skin: Skin is warm and dry. No rash noted. He is not diaphoretic. No erythema. No pallor.  Psychiatric: He has a normal mood and affect. His behavior is normal. Judgment and thought content normal.  Vitals reviewed.   Lab Results  Component Value Date   WBC 9.6 08/29/2016   HGB 13.4 08/29/2016   HCT 39.7 08/29/2016   PLT 133 (L) 08/29/2016   GLUCOSE 105 (H) 09/07/2016   CHOL 101 09/07/2016   TRIG 77.0 09/07/2016   HDL 41.00 09/07/2016   LDLCALC 45 09/07/2016   ALT 28 09/07/2016   AST 20 09/07/2016   NA 141 09/07/2016   K 4.4 09/07/2016   CL 103 09/07/2016   CREATININE 1.20 09/07/2016   BUN 14 09/07/2016   CO2 29 09/07/2016   TSH 0.96 09/16/2015   PSA 0.67 09/07/2016   INR 1.14 08/28/2016   HGBA1C 6.7 (H) 08/28/2016   MICROALBUR 0.8 09/07/2016    Dg Chest 2 View  Result Date: 08/28/2016 CLINICAL DATA:  Syncopal episode today. EXAM: CHEST  2 VIEW COMPARISON:  01/12/2015 FINDINGS: Patchy opacity in the right upper lobe inferiorly may represent early pneumonia, aspiration, pulmonary  hemorrhage. Left lung is clear. No pleural effusions. Normal heart size. Normal pulmonary vasculature. IMPRESSION: Right upper lobe airspace opacity, possibly early pneumonia. Followup PA and lateral chest X-ray is recommended in 3-4 weeks following trial of antibiotic therapy to ensure resolution and exclude underlying malignancy. Electronically Signed   By: Andreas Newport M.D.   On: 08/28/2016 05:55   Dg Elbow Complete Left  Result Date: 08/28/2016 CLINICAL DATA:  [place] out of bed at 02:45 EXAM: LEFT ELBOW - COMPLETE 3+ VIEW COMPARISON:  None. FINDINGS: There is no evidence of fracture, dislocation, or joint effusion. There is no evidence of arthropathy or other focal bone abnormality. Soft tissues are unremarkable. IMPRESSION: Negative. Electronically Signed   By: Andreas Newport M.D.   On: 08/28/2016 05:55   Ct Head Wo Contrast  Result Date: 08/28/2016 CLINICAL DATA:  [place] out of bed at 0245 hours. Amnesia to event. Constipation, vomiting and syncopal episode today. History of hypertension, hyperlipidemia and diabetes. EXAM: CT HEAD WITHOUT CONTRAST TECHNIQUE: Contiguous axial images were obtained from the base of the skull through the vertex without intravenous contrast. COMPARISON:  None. FINDINGS: BRAIN: The ventricles and sulci are normal for age. No  intraparenchymal hemorrhage, mass effect nor midline shift. No acute large vascular territory infarcts. No abnormal extra-axial fluid collections. Basal cisterns are patent. VASCULAR: Minimal calcific atherosclerosis of the carotid siphons and RIGHT vertebral artery. SKULL: No skull fracture. Severe bilateral temporomandibular osteoarthrosis. No significant scalp soft tissue swelling. SINUSES/ORBITS: Trace paranasal sinus mucosal thickening. Mastoid air cells are well aerated. The included ocular globes and orbital contents are non-suspicious. OTHER: None. IMPRESSION: Negative CT HEAD. Electronically Signed   By: Elon Alas M.D.   On: 08/28/2016  04:09    Assessment & Plan:   Shivesh was seen today for hypertension, hyperlipidemia, cough, diabetes and annual exam.  Diagnoses and all orders for this visit:  Community acquired pneumonia of right upper lobe of lung ([place])- based on his symptoms and the appearance of his chest x-ray this infection has resolved -     DG Chest 2 View; Future  Elevated total protein  Essential hypertension- his blood pressure is well-controlled, electrolytes and renal function are normal -     Comprehensive metabolic panel; Future  Hyperlipidemia with target LDL less than 100- he has achieved his LDL goal is doing well on the statin -     Lipid panel; Future -     Comprehensive metabolic panel; Future  Paresthesia of both feet- I've asked him to undergo a NCS and EMG to confirm whether or not this is diabetic neuropathy, will also check him for vitamin B-12 deficiency. -     Vitamin B12; Future -     Ambulatory referral to Neurology -     Folate; Future  BPH with obstruction/lower urinary tract symptoms- will treat his symptoms with an alpha blocker -     Urinalysis, Routine w reflex microscopic; Future -     PSA; Future -     tamsulosin (FLOMAX) 0.4 MG CAPS capsule; Take 1 capsule (0.4 mg total) by mouth daily after supper.  Type 2 diabetes mellitus with complication, without long-term current use of insulin ([place])- his blood sugars are adequately well controlled -     Microalbumin / creatinine urine ratio; Future -     aspirin EC 81 MG tablet; Take 1 tablet (81 mg total) by mouth daily.  Acute bacterial prostatitis- he has symptoms suspicious of prostatitis, his prostate gland is abnormal, and there are white cells in his urine. We'll treat him empirically with a 30 day course of Bactrim DS. -     sulfamethoxazole-trimethoprim (BACTRIM DS,SEPTRA DS) 800-160 MG tablet; Take 1 tablet by mouth 2 (two) times daily.   I am having Mr. Lunden start on aspirin EC, tamsulosin, and  sulfamethoxazole-trimethoprim. I am also having him maintain his metFORMIN, Azilsartan Medoxomil, rosuvastatin, and hydrOXYzine.  Meds ordered this encounter  Medications  . aspirin EC 81 MG tablet    Sig: Take 1 tablet (81 mg total) by mouth daily.    Dispense:  90 tablet    Refill:  3  . tamsulosin (FLOMAX) 0.4 MG CAPS capsule    Sig: Take 1 capsule (0.4 mg total) by mouth daily after supper.    Dispense:  90 capsule    Refill:  1  . sulfamethoxazole-trimethoprim (BACTRIM DS,SEPTRA DS) 800-160 MG tablet    Sig: Take 1 tablet by mouth 2 (two) times daily.    Dispense:  60 tablet    Refill:  0     Follow-up: Return in about 4 months (around 01/05/2017).  Scarlette Calico, MD

## 2016-09-07 NOTE — Progress Notes (Signed)
Pre visit review using our clinic review tool, if applicable. No additional management support is needed unless otherwise documented below in the visit note. 

## 2016-09-07 NOTE — Patient Instructions (Signed)

## 2016-09-08 DIAGNOSIS — Z0000 Encounter for general adult medical examination without abnormal findings: Secondary | ICD-10-CM | POA: Insufficient documentation

## 2016-09-08 NOTE — Assessment & Plan Note (Signed)
Exam completed Labs ordered and reviewed Vaccines reviewed and updated His colonoscopy is up-to-date Patient education material was given

## 2016-09-25 ENCOUNTER — Other Ambulatory Visit: Payer: Self-pay | Admitting: Internal Medicine

## 2016-09-25 DIAGNOSIS — E118 Type 2 diabetes mellitus with unspecified complications: Secondary | ICD-10-CM

## 2016-09-25 DIAGNOSIS — I10 Essential (primary) hypertension: Secondary | ICD-10-CM

## 2016-10-12 ENCOUNTER — Encounter: Payer: Self-pay | Admitting: Internal Medicine

## 2016-10-12 ENCOUNTER — Ambulatory Visit (INDEPENDENT_AMBULATORY_CARE_PROVIDER_SITE_OTHER): Admitting: Internal Medicine

## 2016-10-12 ENCOUNTER — Other Ambulatory Visit (INDEPENDENT_AMBULATORY_CARE_PROVIDER_SITE_OTHER)

## 2016-10-12 VITALS — BP 134/80 | HR 113 | Temp 97.7°F | Resp 16 | Ht 70.0 in | Wt 231.8 lb

## 2016-10-12 DIAGNOSIS — N410 Acute prostatitis: Secondary | ICD-10-CM

## 2016-10-12 LAB — URINALYSIS, ROUTINE W REFLEX MICROSCOPIC
BILIRUBIN URINE: NEGATIVE
HGB URINE DIPSTICK: NEGATIVE
Ketones, ur: NEGATIVE
Leukocytes, UA: NEGATIVE
Nitrite: NEGATIVE
Specific Gravity, Urine: 1.02 (ref 1.000–1.030)
Total Protein, Urine: NEGATIVE
UROBILINOGEN UA: 0.2 (ref 0.0–1.0)
Urine Glucose: NEGATIVE
pH: 6 (ref 5.0–8.0)

## 2016-10-12 NOTE — Patient Instructions (Signed)
Prostatitis Prostatitis is swelling or inflammation of the prostate gland. The prostate is a walnut-sized gland that is involved in the production of semen. It is located below a man's bladder, in front of the rectum. There are four types of prostatitis:  Chronic nonbacterial prostatitis. This is the most common type of prostatitis. It may be associated with a viral infection or autoimmune disorder.  Acute bacterial prostatitis. This is the least common type of prostatitis. It starts quickly and is usually associated with a bladder infection, high fever, and shaking chills. It can occur at any age.  Chronic bacterial prostatitis. This type usually results from acute bacterial prostatitis that happens repeatedly (is recurrent) or has not been treated properly. It can occur in men of any age but is most common among middle-aged men whose prostate has begun to get larger. The symptoms are not as severe as symptoms caused by acute bacterial prostatitis.  Prostatodynia or chronic pelvic pain syndrome (CPPS). This type is also called pelvic floor disorder. It is associated with increased muscular tone in the pelvis surrounding the prostate. What are the causes? Bacterial prostatitis is caused by infection from bacteria. Chronic nonbacterial prostatitis may be caused by:  Urinary tract infections (UTIs).  Nerve damage.  A response by the body's disease-fighting system (autoimmune response).  Chemicals in the urine. The causes of the other types of prostatitis are usually not known. What are the signs or symptoms? Symptoms of this condition vary depending upon the type of prostatitis. If you have acute bacterial prostatitis, you may experience:  Urinary symptoms, such as:  Painful urination.  Burning during urination.  Frequent and sudden urges to urinate.  Inability to start urinating.  A weak or interrupted stream of urine.  Vomiting.  Nausea.  Fever.  Chills.  Inability to  empty the bladder completely.  Pain in the:  Muscles or joints.  Lower back.  Lower abdomen. If you have any of the other types of prostatitis, you may experience:  Urinary symptoms, such as:  Sudden urges to urinate.  Frequent urination.  Difficulty starting urination.  Weak urine stream.  Dribbling after urination.  Discharge from the urethra. The urethra is a tube that opens at the end of the penis.  Pain in the:  Testicles.  Penis or tip of the penis.  Rectum.  Area in front of the rectum and below the scrotum (perineum).  Problems with sexual function.  Painful ejaculation.  Bloody semen. How is this diagnosed? This condition may be diagnosed based on:  A physical and medical exam.  Your symptoms.  A urine test to check for bacteria.  An exam in which a health care provider uses a finger to feel the prostate (digital rectal exam).  A test of a sample of semen.  Blood tests.  Ultrasound.  Removal of prostate tissue to be examined under a microscope (biopsy).  Tests to check how your body handles urine (urodynamic tests).  A test to look inside your bladder or urethra (cystoscopy). How is this treated? Treatment for this condition depends on the type of prostatitis. Treatment may involve:  Medicines to relieve pain or inflammation.  Medicines to help relax your muscles.  Physical therapy.  Heat therapy.  Techniques to help you control certain body functions (biofeedback).  Relaxation exercises.  Antibiotic medicine, if your condition is caused by bacteria.  Warm water baths (sitz baths). Sitz baths help with relaxing your pelvic floor muscles, which helps to relieve pressure on the prostate. Follow   these instructions at home:  Take over-the-counter and prescription medicines only as told by your health care provider.  If you were prescribed an antibiotic, take it as told by your health care provider. Do not stop taking the  antibiotic even if you start to feel better.  If physical therapy, biofeedback, or relaxation exercises were prescribed, do exercises as instructed.  Take sitz baths as directed by your health care provider. For a sitz bath, sit in warm water that is deep enough to cover your hips and buttocks.  Keep all follow-up visits as told by your health care provider. This is important. Contact a health care provider if:  Your symptoms get worse.  You have a fever. Get help right away if:  You have chills.  You feel nauseous.  You vomit.  You feel light-headed or feel like you are going to faint.  You are unable to urinate.  You have blood or blood clots in your urine. This information is not intended to replace advice given to you by your health care provider. Make sure you discuss any questions you have with your health care provider. Document Released: 08/11/2000 Document Revised: 05/04/2016 Document Reviewed: 05/04/2016 Elsevier Interactive Patient Education  2017 [employer]  

## 2016-10-12 NOTE — Progress Notes (Signed)
Subjective:  Patient ID: Brandon Shannon, male    DOB: [birth year]  Age: 61 y.o. MRN: [id number]  CC: Urinary Tract Infection   HPI Brandon Shannon presents for A one-month follow-up after being treated for acute bacterial prostatitis. His urinary symptoms have improved improved though he complains of rare episode of nocturia and incomplete emptying. He denies frequently, dysuria, hematuria, pelvic pain, or flank pain.  Outpatient Medications Prior to Visit  Medication Sig Dispense Refill  . aspirin EC 81 MG tablet Take 1 tablet (81 mg total) by mouth daily. 90 tablet 3  . EDARBI 40 MG TABS TAKE 1 TABLET DAILY 90 tablet 1  . hydrOXYzine (ATARAX/VISTARIL) 10 MG tablet Take 20 mg by mouth at bedtime as needed for itching.    . metFORMIN (GLUCOPHAGE XR) 750 MG 24 hr tablet Take 2 tablets (1,500 mg total) by mouth daily with breakfast. 180 tablet 1  . rosuvastatin (CRESTOR) 40 MG tablet Take 40 mg by mouth daily.    . tamsulosin (FLOMAX) 0.4 MG CAPS capsule Take 1 capsule (0.4 mg total) by mouth daily after supper. 90 capsule 1   No facility-administered medications prior to visit.     ROS Review of Systems  Constitutional: Negative.  Negative for chills, fatigue, fever and unexpected weight change.  HENT: Negative.   Eyes: Negative.   Respiratory: Negative.  Negative for cough, chest tightness, shortness of breath and stridor.   Cardiovascular: Negative for chest pain, palpitations and leg swelling.  Gastrointestinal: Negative for abdominal pain, constipation, diarrhea, nausea and vomiting.  Endocrine: Negative.   Genitourinary: Negative for difficulty urinating, dysuria, enuresis, flank pain, frequency, hematuria, penile swelling, scrotal swelling and testicular pain.  Musculoskeletal: Negative.  Negative for back pain, myalgias and neck pain.  Skin: Negative.  Negative for color change and rash.  Allergic/Immunologic: Negative.   Neurological: Negative.  Negative for dizziness and  light-headedness.  Hematological: Negative.  Negative for adenopathy. Does not bruise/bleed easily.  Psychiatric/Behavioral: Negative.     Objective:  BP 134/80 (BP Location: Right Arm, Patient Position: Sitting, Cuff Size: Large)   Pulse (!) 113   Temp 97.7 F (36.5 C) (Oral)   Resp 16   Ht 5\' 10"  (1.778 m)   Wt 231 lb 12 oz (105.1 kg)   SpO2 98%   BMI 33.25 kg/m   BP Readings from Last 3 Encounters:  10/12/16 134/80  09/07/16 118/90  08/30/16 137/83    Wt Readings from Last 3 Encounters:  10/12/16 231 lb 12 oz (105.1 kg)  09/07/16 227 lb 8 oz (103.2 kg)  08/30/16 229 lb 8 oz (104.1 kg)    Physical Exam  Constitutional: He is oriented to person, place, and time. No distress.  HENT:  Mouth/Throat: Oropharynx is clear and moist. No oropharyngeal exudate.  Eyes: Conjunctivae are normal. Right eye exhibits no discharge. Left eye exhibits no discharge. No scleral icterus.  Neck: Normal range of motion. Neck supple. No JVD present. No tracheal deviation present. No thyromegaly present.  Cardiovascular: Normal rate, regular rhythm, normal heart sounds and intact distal pulses.  Exam reveals no gallop and no friction rub.   No murmur heard. Pulmonary/Chest: Effort normal and breath sounds normal. No stridor. No respiratory distress. He has no wheezes. He has no rales. He exhibits no tenderness.  Abdominal: Soft. Bowel sounds are normal. He exhibits no distension and no mass. There is no tenderness. There is no rebound and no guarding. Hernia confirmed negative in the right inguinal area and  confirmed negative in the left inguinal area.  Genitourinary: Rectum normal, testes normal and penis normal. Rectal exam shows no external hemorrhoid, no internal hemorrhoid, no fissure, no mass, no tenderness, anal tone normal and guaiac negative stool. Prostate is enlarged (1+ smooth symm BPH with bogginess). Prostate is not tender. Right testis shows no mass, no swelling and no tenderness. Right  testis is descended. Left testis shows no mass, no swelling and no tenderness. Left testis is descended. Circumcised. No penile erythema or penile tenderness. No discharge found.  Musculoskeletal: Normal range of motion. He exhibits no edema, tenderness or deformity.  Lymphadenopathy:    He has no cervical adenopathy.       Right: No inguinal adenopathy present.       Left: No inguinal adenopathy present.  Neurological: He is oriented to person, place, and time.  Skin: Skin is warm and dry. No rash noted. He is not diaphoretic. No erythema. No pallor.  Vitals reviewed.   Lab Results  Component Value Date   WBC 9.6 08/29/2016   HGB 13.4 08/29/2016   HCT 39.7 08/29/2016   PLT 133 (L) 08/29/2016   GLUCOSE 105 (H) 09/07/2016   CHOL 101 09/07/2016   TRIG 77.0 09/07/2016   HDL 41.00 09/07/2016   LDLCALC 45 09/07/2016   ALT 28 09/07/2016   AST 20 09/07/2016   NA 141 09/07/2016   K 4.4 09/07/2016   CL 103 09/07/2016   CREATININE 1.20 09/07/2016   BUN 14 09/07/2016   CO2 29 09/07/2016   TSH 0.96 09/16/2015   PSA 0.67 09/07/2016   INR 1.14 08/28/2016   HGBA1C 6.7 (H) 08/28/2016   MICROALBUR 0.8 09/07/2016    Dg Chest 2 View  Result Date: 09/07/2016 CLINICAL DATA:  Followup pneumonia, hypertension, diabetes mellitus EXAM: CHEST  2 VIEW COMPARISON:  08/28/2016 FINDINGS: Normal heart size, mediastinal contours, and pulmonary vascularity. Resolution of previously identified RIGHT upper lobe infiltrate. Lungs now clear. No pleural effusion or pneumothorax. Bones unremarkable. IMPRESSION: Resolution of previously identified RIGHT upper lobe pneumonia. Electronically Signed   By: Lavonia Dana M.D.   On: 09/07/2016 09:10    Assessment & Plan:   Brandon Shannon was seen today for urinary tract infection.  Diagnoses and all orders for this visit:  Acute bacterial prostatitis- based on his symptoms and urinalysis the infection has resolved. His persistent symptoms are related to BPH. Will continue  Flomax to treat this. -     Urinalysis, Routine w reflex microscopic; Future -     CULTURE, URINE COMPREHENSIVE; Future   I am having Brandon Shannon maintain his metFORMIN, rosuvastatin, hydrOXYzine, aspirin EC, tamsulosin, and EDARBI.  No orders of the defined types were placed in this encounter.    Follow-up: Return in about 3 months (around 01/09/2017).  Scarlette Calico, MD

## 2016-10-12 NOTE — Progress Notes (Signed)
Pre visit review using our clinic review tool, if applicable. No additional management support is needed unless otherwise documented below in the visit note. 

## 2016-10-14 ENCOUNTER — Encounter: Payer: Self-pay | Admitting: Internal Medicine

## 2016-10-14 LAB — CULTURE, URINE COMPREHENSIVE: ORGANISM ID, BACTERIA: NO GROWTH

## 2016-10-31 ENCOUNTER — Ambulatory Visit (INDEPENDENT_AMBULATORY_CARE_PROVIDER_SITE_OTHER): Admitting: Nurse Practitioner

## 2016-10-31 ENCOUNTER — Encounter: Payer: Self-pay | Admitting: Nurse Practitioner

## 2016-10-31 ENCOUNTER — Other Ambulatory Visit (INDEPENDENT_AMBULATORY_CARE_PROVIDER_SITE_OTHER)

## 2016-10-31 VITALS — BP 140/90 | HR 78 | Temp 98.4°F | Ht 70.0 in | Wt 237.0 lb

## 2016-10-31 DIAGNOSIS — I10 Essential (primary) hypertension: Secondary | ICD-10-CM

## 2016-10-31 LAB — BASIC METABOLIC PANEL WITH GFR
BUN: 11 mg/dL (ref 6–23)
CO2: 31 meq/L (ref 19–32)
Calcium: 10.1 mg/dL (ref 8.4–10.5)
Chloride: 103 meq/L (ref 96–112)
Creatinine, Ser: 1.1 mg/dL (ref 0.40–1.50)
GFR: 87.03 mL/min
Glucose, Bld: 109 mg/dL — ABNORMAL HIGH (ref 70–99)
Potassium: 4.1 meq/L (ref 3.5–5.1)
Sodium: 140 meq/L (ref 135–145)

## 2016-10-31 NOTE — Progress Notes (Signed)
Pre visit review using our clinic review tool, if applicable. No additional management support is needed unless otherwise documented below in the visit note. 

## 2016-10-31 NOTE — Progress Notes (Signed)
Subjective:  Patient ID: Brandon Shannon, male    DOB: [birth year]  Age: 61 y.o. MRN: [id number]  CC: Hypertension (Pt has checked BP 155/91 on 10/27/2016 at [facility]. 10/30/2016 is was 181/107 1st time and 2nd time 180/100)   Hypertension  This is a chronic problem. The problem has been waxing and waning since onset. The problem is uncontrolled. Associated symptoms include malaise/fatigue. Pertinent negatives include no anxiety, blurred vision, chest pain, headaches, neck pain, orthopnea, palpitations, peripheral edema, PND, shortness of breath or sweats. Risk factors for coronary artery disease include dyslipidemia, male gender, obesity and sedentary lifestyle. Past treatments include angiotensin blockers. The current treatment provides moderate improvement. There are no compliance problems.   checked BP mid day at [place] (155/91, 181/107, 180/100) He takes BP medication in morning.  Outpatient Medications Prior to Visit  Medication Sig Dispense Refill  . aspirin EC 81 MG tablet Take 1 tablet (81 mg total) by mouth daily. 90 tablet 3  . EDARBI 40 MG TABS TAKE 1 TABLET DAILY 90 tablet 1  . hydrOXYzine (ATARAX/VISTARIL) 10 MG tablet Take 20 mg by mouth at bedtime as needed for itching.    . metFORMIN (GLUCOPHAGE XR) 750 MG 24 hr tablet Take 2 tablets (1,500 mg total) by mouth daily with breakfast. 180 tablet 1  . rosuvastatin (CRESTOR) 40 MG tablet Take 40 mg by mouth daily.    . tamsulosin (FLOMAX) 0.4 MG CAPS capsule Take 1 capsule (0.4 mg total) by mouth daily after supper. 90 capsule 1   No facility-administered medications prior to visit.     ROS See HPI  Objective:  BP 140/90 (BP Location: Left Arm, Patient Position: Sitting, Cuff Size: Large)   Pulse 78   Temp 98.4 F (36.9 C) (Oral)   Ht 5\' 10"  (1.778 m)   Wt 237 lb (107.5 kg)   SpO2 98%   BMI 34.01 kg/m   BP Readings from Last 3 Encounters:  10/31/16 140/90  10/12/16 134/80  09/07/16 118/90    Wt Readings  from Last 3 Encounters:  10/31/16 237 lb (107.5 kg)  10/12/16 231 lb 12 oz (105.1 kg)  09/07/16 227 lb 8 oz (103.2 kg)    Physical Exam  Constitutional: He is oriented to person, place, and time. No distress.  Neck: Normal range of motion. Neck supple.  Cardiovascular: Normal rate, regular rhythm and normal heart sounds.   Pulmonary/Chest: Effort normal and breath sounds normal.  Musculoskeletal: He exhibits no edema.  Neurological: He is alert and oriented to person, place, and time.  Vitals reviewed.   Lab Results  Component Value Date   WBC 9.6 08/29/2016   HGB 13.4 08/29/2016   HCT 39.7 08/29/2016   PLT 133 (L) 08/29/2016   GLUCOSE 109 (H) 10/31/2016   CHOL 101 09/07/2016   TRIG 77.0 09/07/2016   HDL 41.00 09/07/2016   LDLCALC 45 09/07/2016   ALT 28 09/07/2016   AST 20 09/07/2016   NA 140 10/31/2016   K 4.1 10/31/2016   CL 103 10/31/2016   CREATININE 1.10 10/31/2016   BUN 11 10/31/2016   CO2 31 10/31/2016   TSH 0.96 09/16/2015   PSA 0.67 09/07/2016   INR 1.14 08/28/2016   HGBA1C 6.7 (H) 08/28/2016   MICROALBUR 0.8 09/07/2016    Dg Chest 2 View  Result Date: 09/07/2016 CLINICAL DATA:  Followup pneumonia, hypertension, diabetes mellitus EXAM: CHEST  2 VIEW COMPARISON:  08/28/2016 FINDINGS: Normal heart size, mediastinal contours, and pulmonary vascularity. Resolution  of previously identified RIGHT upper lobe infiltrate. Lungs now clear. No pleural effusion or pneumothorax. Bones unremarkable. IMPRESSION: Resolution of previously identified RIGHT upper lobe pneumonia. Electronically Signed   By: Lavonia Dana M.D.   On: 09/07/2016 09:10    Assessment & Plan:   Samule was seen today for hypertension.  Diagnoses and all orders for this visit:  Essential hypertension -     Basic metabolic panel; Future   I am having Mr. Camerino maintain his metFORMIN, rosuvastatin, hydrOXYzine, aspirin EC, tamsulosin, and EDARBI.  No orders of the defined types were placed in this  encounter.   Follow-up: Return if symptoms worsen or fail to improve.  Wilfred Lacy, NP

## 2016-10-31 NOTE — Patient Instructions (Addendum)
Go to lab for BMP draw. You will be called with results.  Please check BP in morning before AM medications and at bedtime. Record BP readings for next 2days and call office with readings.  Will consider increasing or adding BP medication if AM BP is persistent elevated >140/80.  Avoid diuretic due to nocturnal urination despite use of flomax.  He denied antispamodic due to possible adverse side effects.  Hypertension Hypertension is another name for high blood pressure. High blood pressure forces your heart to work harder to pump blood. This can cause problems over time. There are two numbers in a blood pressure reading. There is a top number (systolic) over a bottom number (diastolic). It is best to have a blood pressure below 120/80. Healthy choices can help lower your blood pressure. You may need medicine to help lower your blood pressure if:  Your blood pressure cannot be lowered with healthy choices.  Your blood pressure is higher than 130/80. Follow these instructions at home: Eating and drinking   If directed, follow the DASH eating plan. This diet includes:  Filling half of your plate at each meal with fruits and vegetables.  Filling one quarter of your plate at each meal with whole grains. Whole grains include whole wheat pasta, brown rice, and whole grain bread.  Eating or drinking low-fat dairy products, such as skim milk or low-fat yogurt.  Filling one quarter of your plate at each meal with low-fat (lean) proteins. Low-fat proteins include fish, skinless chicken, eggs, beans, and tofu.  Avoiding fatty meat, cured and processed meat, or chicken with skin.  Avoiding premade or processed food.  Eat less than 1,500 mg of salt (sodium) a day.  Limit alcohol use to no more than 1 drink a day for nonpregnant women and 2 drinks a day for men. One drink equals 12 oz of beer, 5 oz of wine, or 1 oz of hard liquor. Lifestyle   Work with your doctor to stay at a healthy  weight or to lose weight. Ask your doctor what the best weight is for you.  Get at least 30 minutes of exercise that causes your heart to beat faster (aerobic exercise) most days of the week. This may include walking, swimming, or biking.  Get at least 30 minutes of exercise that strengthens your muscles (resistance exercise) at least 3 days a week. This may include lifting weights or pilates.  Do not use any products that contain nicotine or tobacco. This includes cigarettes and e-cigarettes. If you need help quitting, ask your doctor.  Check your blood pressure at home as told by your doctor.  Keep all follow-up visits as told by your doctor. This is important. Medicines   Take over-the-counter and prescription medicines only as told by your doctor. Follow directions carefully.  Do not skip doses of blood pressure medicine. The medicine does not work as well if you skip doses. Skipping doses also puts you at risk for problems.  Ask your doctor about side effects or reactions to medicines that you should watch for. Contact a doctor if:  You think you are having a reaction to the medicine you are taking.  You have headaches that keep coming back (recurring).  You feel dizzy.  You have swelling in your ankles.  You have trouble with your vision. Get help right away if:  You get a very bad headache.  You start to feel confused.  You feel weak or numb.  You feel faint.  You  get very bad pain in your:  Chest.  Belly (abdomen).  You throw up (vomit) more than once.  You have trouble breathing. Summary  Hypertension is another name for high blood pressure.  Making healthy choices can help lower blood pressure. If your blood pressure cannot be controlled with healthy choices, you may need to take medicine. This information is not intended to replace advice given to you by your health care provider. Make sure you discuss any questions you have with your health care  provider. Document Released: 01/31/2008 Document Revised: 07/12/2016 Document Reviewed: 07/12/2016 Elsevier Interactive Patient Education  2017 [employer].

## 2016-11-03 ENCOUNTER — Telehealth: Payer: Self-pay | Admitting: Internal Medicine

## 2016-11-03 DIAGNOSIS — I10 Essential (primary) hypertension: Secondary | ICD-10-CM

## 2016-11-03 MED ORDER — AMLODIPINE BESYLATE 5 MG PO TABS: 5.0000 mg | ORAL_TABLET | Freq: Every day | ORAL | 3 refills | Status: DC

## 2016-11-03 NOTE — Telephone Encounter (Signed)
Spoke with pt, advise pt to take amlodipine at night and continue Edarbi to help with his BP, advise to keep record of his reading and call our office there is anything else he is concern. Pt verbalized understand  Pt also wants to let Baldo Ash know that he also taking zyrtec sure if this can cause BP to be up. FYI

## 2016-11-03 NOTE — Telephone Encounter (Signed)
Patient called in and said Brandon Shannon wanted him to give her updates on his Bp.Marland Kitchen His reading are:  7 - 179/101 Pm 167/93  8 - 167/99 Pm 165/97  9 - 155/95  Please follow up with him if he needs to do anything he asked. Thank you.

## 2016-12-04 ENCOUNTER — Encounter: Payer: Self-pay | Admitting: Internal Medicine

## 2016-12-04 ENCOUNTER — Other Ambulatory Visit (INDEPENDENT_AMBULATORY_CARE_PROVIDER_SITE_OTHER)

## 2016-12-04 ENCOUNTER — Ambulatory Visit (INDEPENDENT_AMBULATORY_CARE_PROVIDER_SITE_OTHER): Admitting: Internal Medicine

## 2016-12-04 VITALS — BP 130/80 | HR 78 | Temp 97.6°F | Resp 16 | Ht 70.0 in | Wt 234.0 lb

## 2016-12-04 DIAGNOSIS — I10 Essential (primary) hypertension: Secondary | ICD-10-CM

## 2016-12-04 DIAGNOSIS — E118 Type 2 diabetes mellitus with unspecified complications: Secondary | ICD-10-CM | POA: Diagnosis not present

## 2016-12-04 LAB — BASIC METABOLIC PANEL
BUN: 16 mg/dL (ref 6–23)
CALCIUM: 9.4 mg/dL (ref 8.4–10.5)
CO2: 26 mEq/L (ref 19–32)
CREATININE: 1.16 mg/dL (ref 0.40–1.50)
Chloride: 105 mEq/L (ref 96–112)
GFR: 81.83 mL/min (ref >60.00)
Glucose, Bld: 111 mg/dL — ABNORMAL HIGH (ref 70–99)
Potassium: 4.1 mEq/L (ref 3.5–5.1)
Sodium: 139 mEq/L (ref 135–145)

## 2016-12-04 LAB — HEMOGLOBIN A1C: HEMOGLOBIN A1C: 6.7 % — AB (ref 4.6–6.5)

## 2016-12-04 NOTE — Patient Instructions (Signed)

## 2016-12-04 NOTE — Progress Notes (Signed)
Pre visit review using our clinic review tool, if applicable. No additional management support is needed unless otherwise documented below in the visit note. 

## 2016-12-04 NOTE — Progress Notes (Signed)
Subjective:  Patient ID: Brandon Shannon, male    DOB: [birth year]  Age: 61 y.o. MRN: [id number]  CC: Hypertension and Diabetes   HPI Brandon Shannon presents for Follow-up on hypertension and diabetes. He tells me his blood pressure has been well controlled at home over the last few weeks. He feels well and offers no complaints.  Outpatient Medications Prior to Visit  Medication Sig Dispense Refill  . amLODipine (NORVASC) 5 MG tablet Take 1 tablet (5 mg total) by mouth daily. 30 tablet 3  . aspirin EC 81 MG tablet Take 1 tablet (81 mg total) by mouth daily. 90 tablet 3  . EDARBI 40 MG TABS TAKE 1 TABLET DAILY 90 tablet 1  . hydrOXYzine (ATARAX/VISTARIL) 10 MG tablet Take 20 mg by mouth at bedtime as needed for itching.    . metFORMIN (GLUCOPHAGE XR) 750 MG 24 hr tablet Take 2 tablets (1,500 mg total) by mouth daily with breakfast. 180 tablet 1  . rosuvastatin (CRESTOR) 40 MG tablet Take 40 mg by mouth daily.    . tamsulosin (FLOMAX) 0.4 MG CAPS capsule Take 1 capsule (0.4 mg total) by mouth daily after supper. 90 capsule 1   No facility-administered medications prior to visit.     ROS Review of Systems  Constitutional: Negative for activity change, appetite change, diaphoresis, fatigue and unexpected weight change.  HENT: Negative.   Eyes: Negative for visual disturbance.  Respiratory: Negative.  Negative for cough, chest tightness, shortness of breath and wheezing.   Cardiovascular: Negative.  Negative for chest pain, palpitations and leg swelling.  Gastrointestinal: Negative for abdominal pain, constipation, nausea and vomiting.  Endocrine: Negative for polydipsia, polyphagia and polyuria.  Genitourinary: Negative.  Negative for decreased urine volume and difficulty urinating.  Musculoskeletal: Negative.  Negative for back pain and myalgias.  Skin: Negative.  Negative for color change and rash.  Allergic/Immunologic: Negative.   Neurological: Negative.  Negative for dizziness,  weakness, light-headedness and headaches.  Hematological: Negative for adenopathy. Does not bruise/bleed easily.  Psychiatric/Behavioral: Negative.     Objective:  BP 130/80 (BP Location: Left Arm, Patient Position: Sitting, Cuff Size: Large)   Pulse 78   Temp 97.6 F (36.4 C) (Oral)   Resp 16   Ht 5\' 10"  (1.778 m)   Wt 234 lb (106.1 kg)   SpO2 98%   BMI 33.58 kg/m   BP Readings from Last 3 Encounters:  12/04/16 130/80  10/31/16 140/90  10/12/16 134/80    Wt Readings from Last 3 Encounters:  12/04/16 234 lb (106.1 kg)  10/31/16 237 lb (107.5 kg)  10/12/16 231 lb 12 oz (105.1 kg)    Physical Exam  Constitutional: He is oriented to person, place, and time. No distress.  HENT:  Mouth/Throat: Oropharynx is clear and moist. No oropharyngeal exudate.  Eyes: Conjunctivae are normal. Right eye exhibits no discharge. Left eye exhibits no discharge. No scleral icterus.  Neck: Normal range of motion. Neck supple. No JVD present. No tracheal deviation present. No thyromegaly present.  Cardiovascular: Normal rate, regular rhythm, normal heart sounds and intact distal pulses.  Exam reveals no gallop and no friction rub.   No murmur heard. Pulmonary/Chest: Effort normal and breath sounds normal. No stridor. No respiratory distress. He has no wheezes. He has no rales.  Abdominal: Soft. Bowel sounds are normal. He exhibits no distension and no mass. There is no tenderness. There is no rebound and no guarding.  Musculoskeletal: Normal range of motion. He exhibits no edema,  tenderness or deformity.  Lymphadenopathy:    He has no cervical adenopathy.  Neurological: He is oriented to person, place, and time.  Skin: Skin is warm and dry. No rash noted. He is not diaphoretic. No erythema. No pallor.  Vitals reviewed.   Lab Results  Component Value Date   WBC 9.6 08/29/2016   HGB 13.4 08/29/2016   HCT 39.7 08/29/2016   PLT 133 (L) 08/29/2016   GLUCOSE 111 (H) 12/04/2016   CHOL 101  09/07/2016   TRIG 77.0 09/07/2016   HDL 41.00 09/07/2016   LDLCALC 45 09/07/2016   ALT 28 09/07/2016   AST 20 09/07/2016   NA 139 12/04/2016   K 4.1 12/04/2016   CL 105 12/04/2016   CREATININE 1.16 12/04/2016   BUN 16 12/04/2016   CO2 26 12/04/2016   TSH 0.96 09/16/2015   PSA 0.67 09/07/2016   INR 1.14 08/28/2016   HGBA1C 6.7 (H) 12/04/2016   MICROALBUR 0.8 09/07/2016    Dg Chest 2 View  Result Date: 09/07/2016 CLINICAL DATA:  Followup pneumonia, hypertension, diabetes mellitus EXAM: CHEST  2 VIEW COMPARISON:  08/28/2016 FINDINGS: Normal heart size, mediastinal contours, and pulmonary vascularity. Resolution of previously identified RIGHT upper lobe infiltrate. Lungs now clear. No pleural effusion or pneumothorax. Bones unremarkable. IMPRESSION: Resolution of previously identified RIGHT upper lobe pneumonia. Electronically Signed   By: Lavonia Dana M.D.   On: 09/07/2016 09:10    Assessment & Plan:   Brandon Shannon was seen today for hypertension and diabetes.  Diagnoses and all orders for this visit:  Essential hypertension- His blood pressure is well-controlled, electrolytes and renal function are normal. -     Basic metabolic panel; Future  Type 2 diabetes mellitus with complication, without long-term current use of insulin (Brandon Shannon)- his A1c remains at 6.7%, his blood sugars are adequately well controlled on the current dose of metformin. I did encourage him to improve his lifestyle modifications as well. -     Basic metabolic panel; Future -     Hemoglobin A1c; Future   I am having Brandon Shannon maintain his metFORMIN, rosuvastatin, hydrOXYzine, aspirin EC, tamsulosin, EDARBI, and amLODipine.  No orders of the defined types were placed in this encounter.    Follow-up: Return in about 6 months (around 06/05/2017).  Scarlette Calico, MD

## 2017-01-31 IMAGING — DX DG ELBOW COMPLETE 3+V*L*
4 series · 4 of 4 positions shown · non-contrast
Comparison: None.

CLINICAL DATA: Fell out of bed at [DATE]

EXAM:
LEFT ELBOW - COMPLETE 3+ VIEW

[elbow ap]
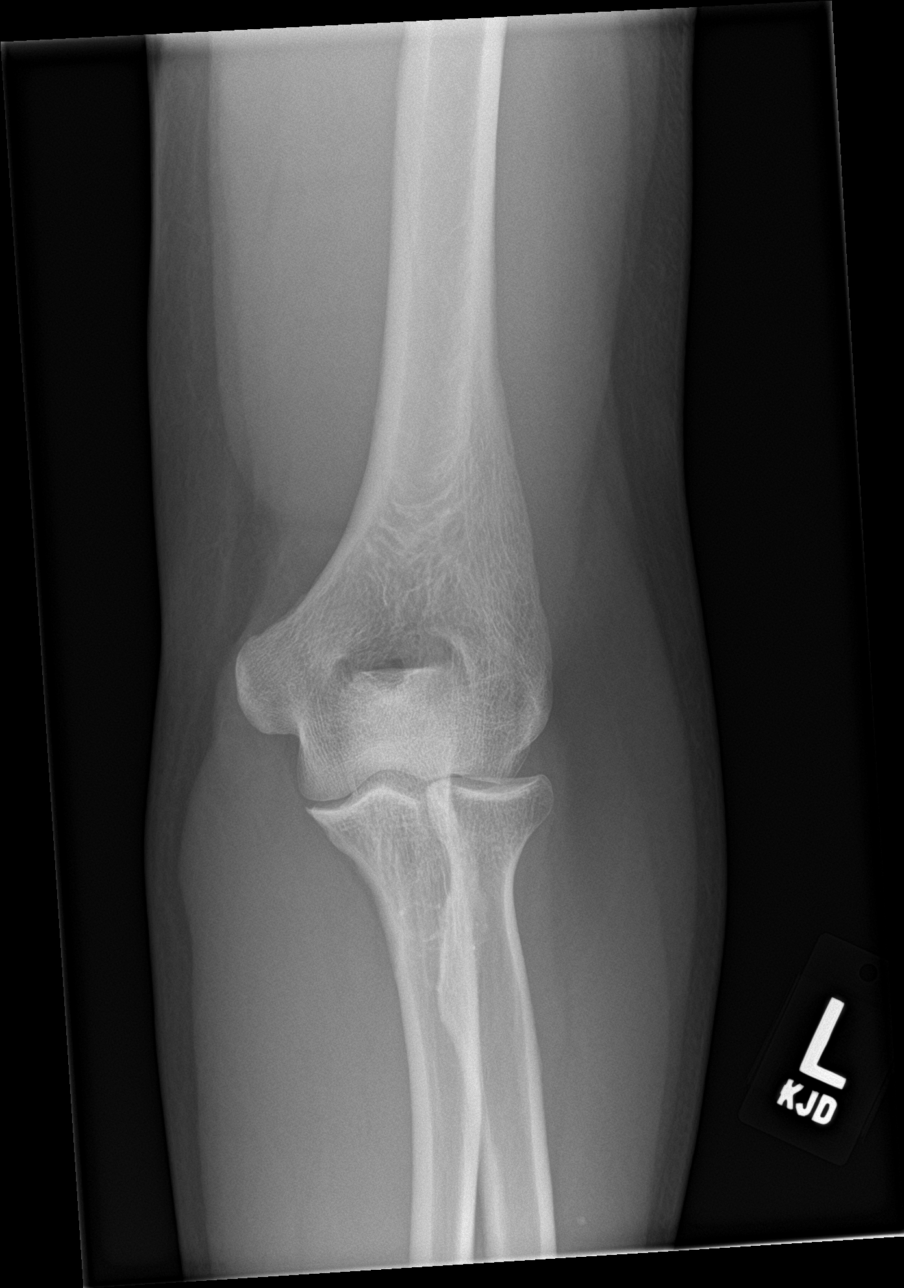

[elbow obl (1 of 2)]
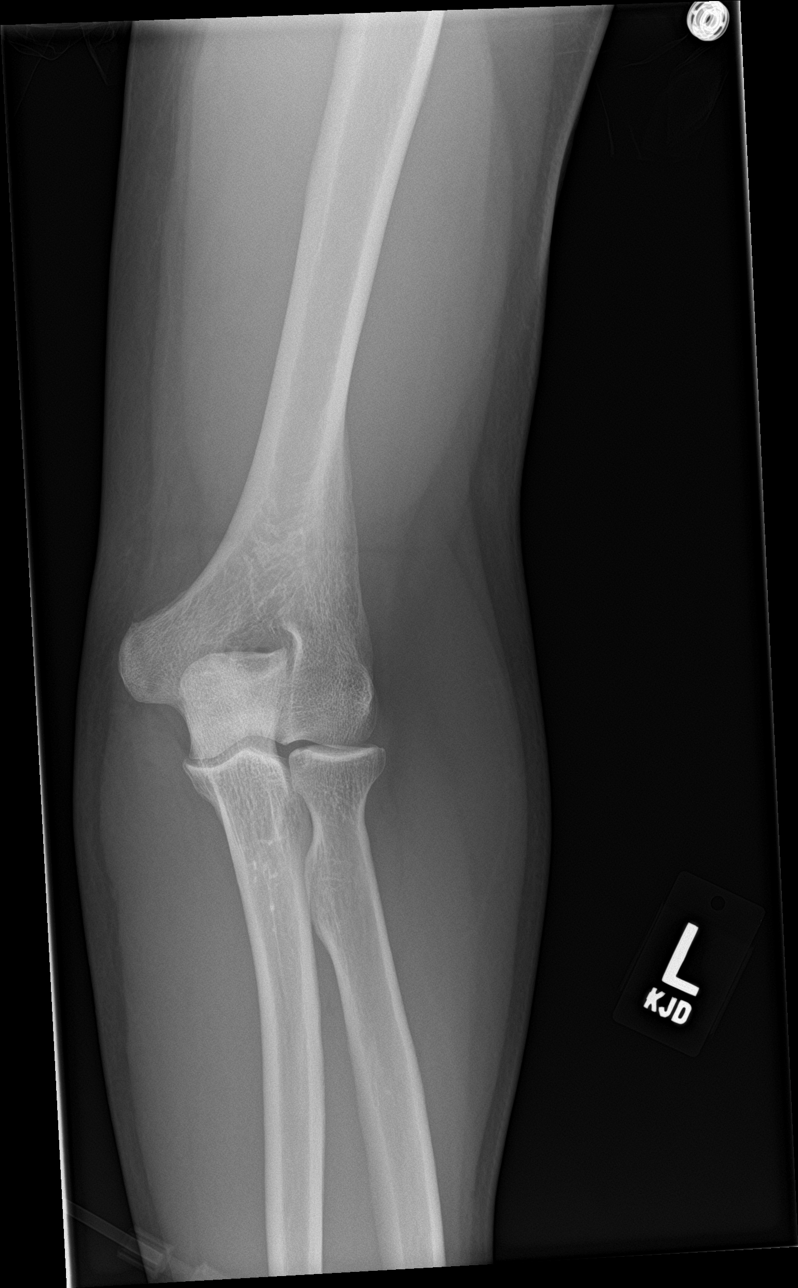

[elbow obl (2 of 2)]
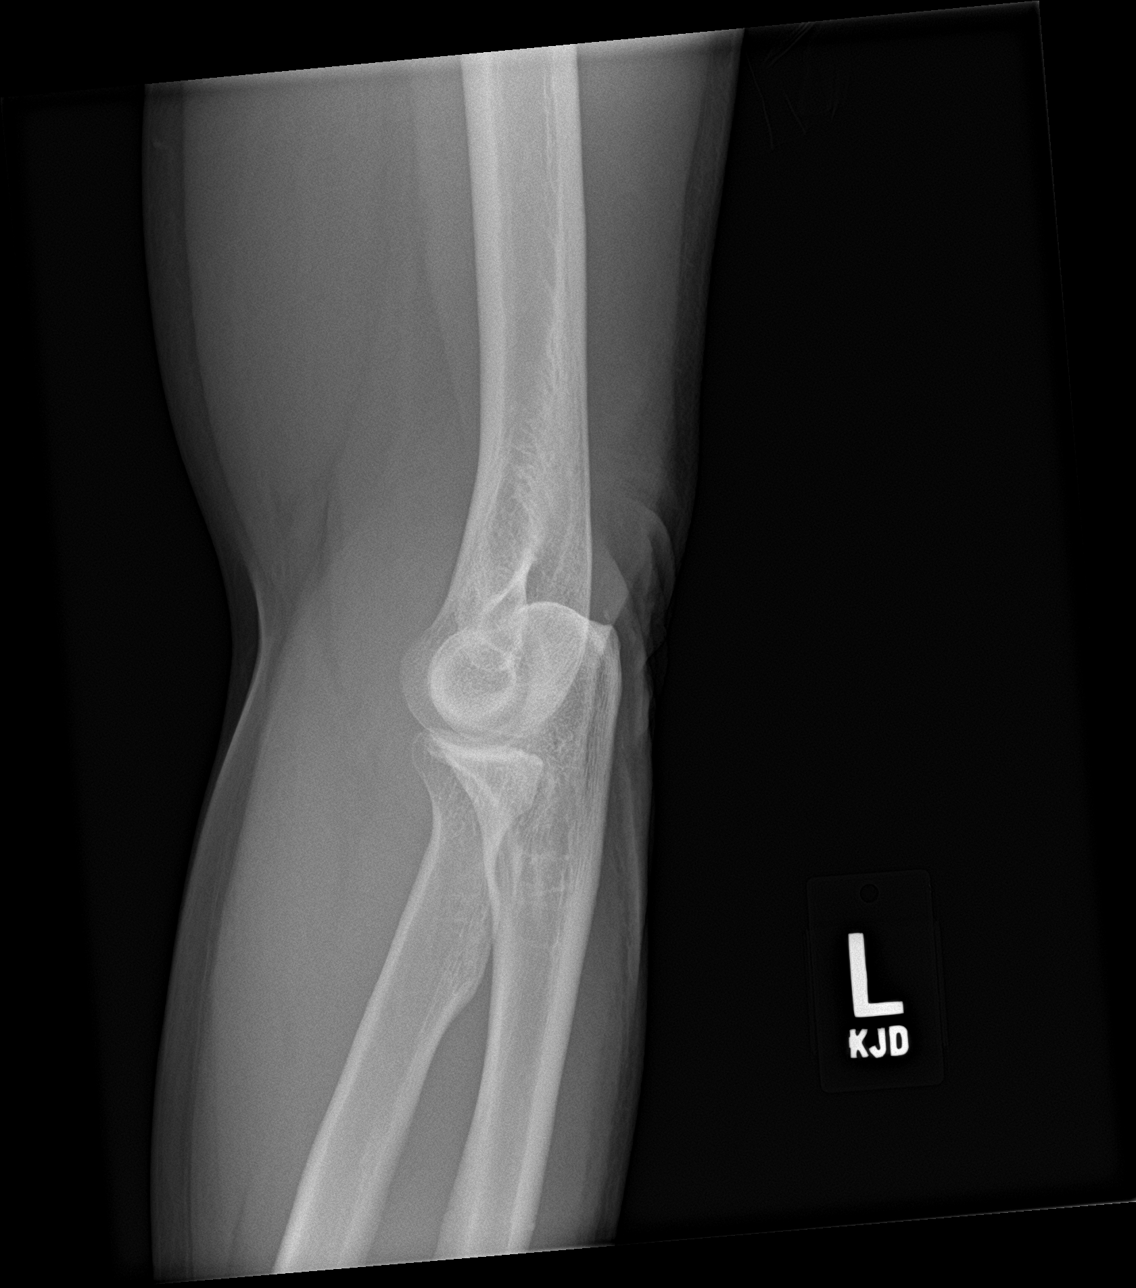

[elbow lat]
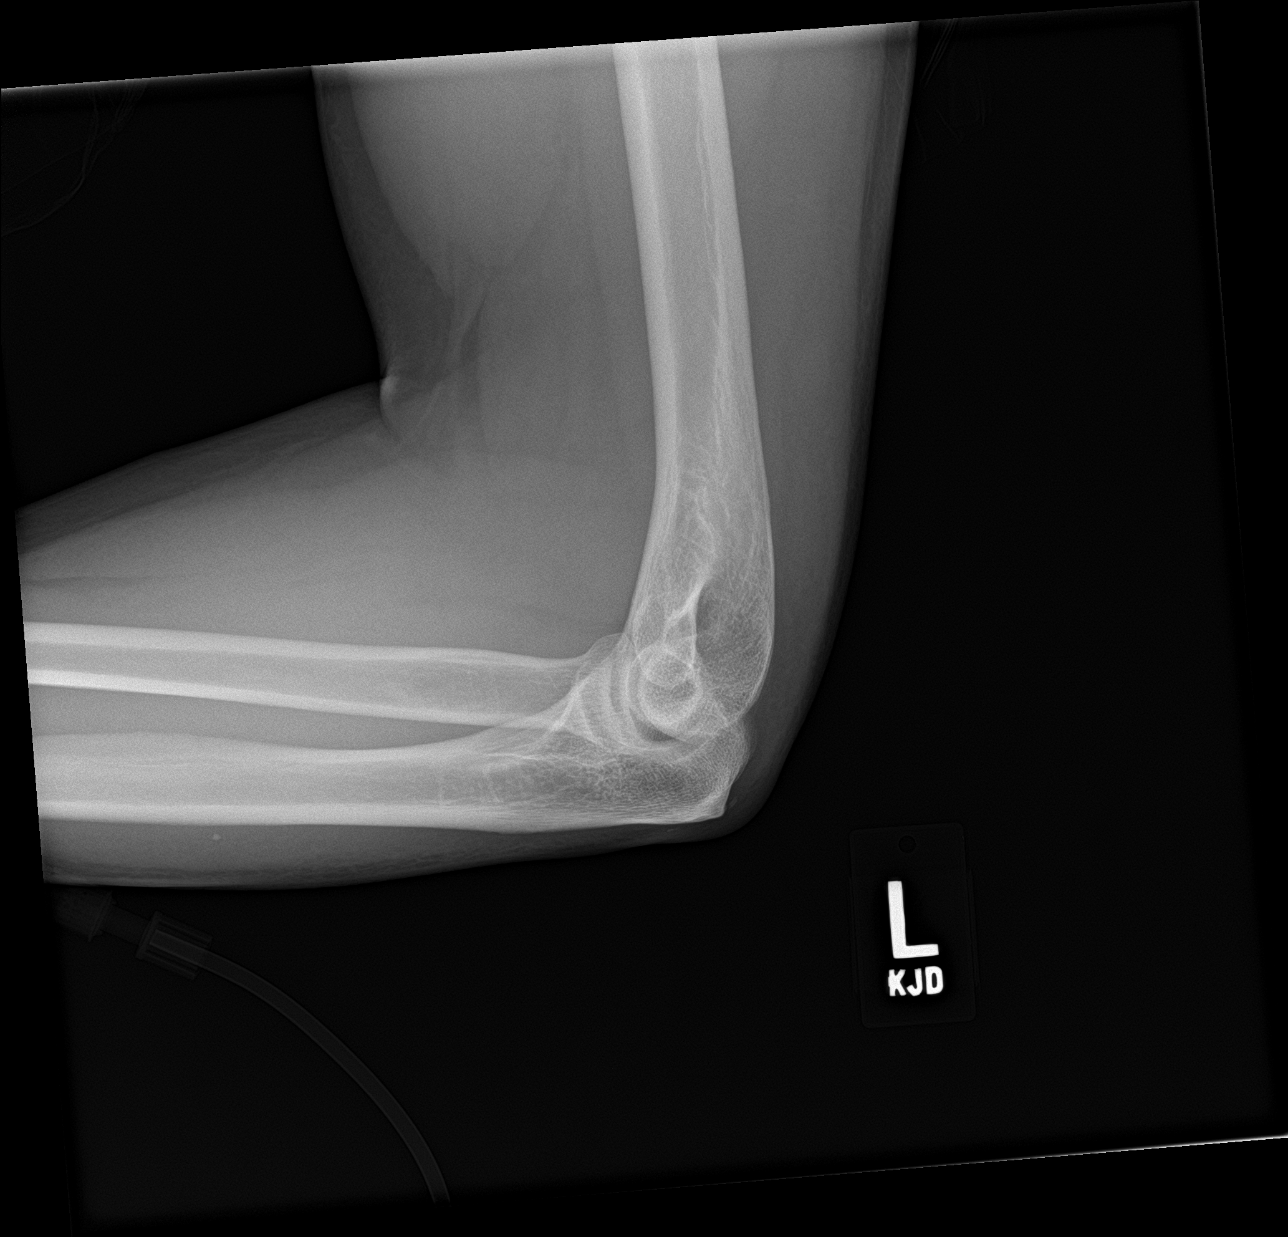

[4 of 4 positions shown; findings below may reference images not displayed]

FINDINGS: There is no evidence of fracture, dislocation, or joint effusion.
There is no evidence of arthropathy or other focal bone abnormality.
Soft tissues are unremarkable.
IMPRESSION: Negative.

## 2017-04-19 ENCOUNTER — Other Ambulatory Visit: Payer: Self-pay | Admitting: Internal Medicine

## 2017-04-19 DIAGNOSIS — I10 Essential (primary) hypertension: Secondary | ICD-10-CM

## 2017-04-19 DIAGNOSIS — E118 Type 2 diabetes mellitus with unspecified complications: Secondary | ICD-10-CM

## 2017-06-02 ENCOUNTER — Emergency Department (HOSPITAL_COMMUNITY)
Admission: EM | Admit: 2017-06-02 | Discharge: 2017-06-02 | Disposition: A | Discharge status: Home / Self Care | Payer: Non-veteran care | Attending: Emergency Medicine | Admitting: Emergency Medicine

## 2017-06-02 ENCOUNTER — Emergency Department (HOSPITAL_COMMUNITY): Payer: Non-veteran care

## 2017-06-02 ENCOUNTER — Encounter (HOSPITAL_COMMUNITY): Payer: Self-pay

## 2017-06-02 DIAGNOSIS — I10 Essential (primary) hypertension: Secondary | ICD-10-CM | POA: Diagnosis not present

## 2017-06-02 DIAGNOSIS — E119 Type 2 diabetes mellitus without complications: Secondary | ICD-10-CM | POA: Diagnosis not present

## 2017-06-02 DIAGNOSIS — Z7982 Long term (current) use of aspirin: Secondary | ICD-10-CM | POA: Insufficient documentation

## 2017-06-02 DIAGNOSIS — R55 Syncope and collapse: Secondary | ICD-10-CM | POA: Insufficient documentation

## 2017-06-02 DIAGNOSIS — Z79899 Other long term (current) drug therapy: Secondary | ICD-10-CM | POA: Diagnosis not present

## 2017-06-02 DIAGNOSIS — R42 Dizziness and giddiness: Secondary | ICD-10-CM | POA: Diagnosis present

## 2017-06-02 LAB — BASIC METABOLIC PANEL
Anion gap: 8 (ref 5–15)
BUN: 16 mg/dL (ref 6–20)
CALCIUM: 8.5 mg/dL — AB (ref 8.9–10.3)
CO2: 23 mmol/L (ref 22–32)
CREATININE: 1.12 mg/dL (ref 0.61–1.24)
Chloride: 108 mmol/L (ref 101–111)
GFR calc Af Amer: >60 mL/min (ref >60)
Glucose, Bld: 172 mg/dL — ABNORMAL HIGH (ref 65–99)
Potassium: 3.7 mmol/L (ref 3.5–5.1)
SODIUM: 139 mmol/L (ref 135–145)

## 2017-06-02 LAB — URINALYSIS, ROUTINE W REFLEX MICROSCOPIC
Bilirubin Urine: NEGATIVE
GLUCOSE, UA: NEGATIVE mg/dL
Hgb urine dipstick: NEGATIVE
KETONES UR: NEGATIVE mg/dL
LEUKOCYTES UA: NEGATIVE
NITRITE: NEGATIVE
Protein, ur: NEGATIVE mg/dL
Specific Gravity, Urine: 1.021 (ref 1.005–1.030)
pH: 6 (ref 5.0–8.0)

## 2017-06-02 LAB — CBC
HCT: 39.7 % (ref 39.0–52.0)
Hemoglobin: 13.3 g/dL (ref 13.0–17.0)
MCH: 29.8 pg (ref 26.0–34.0)
MCHC: 33.5 g/dL (ref 30.0–36.0)
MCV: 89 fL (ref 78.0–100.0)
PLATELETS: 136 10*3/uL — AB (ref 150–400)
RBC: 4.46 MIL/uL (ref 4.22–5.81)
RDW: 13.1 % (ref 11.5–15.5)
WBC: 9.6 10*3/uL (ref 4.0–10.5)

## 2017-06-02 LAB — TROPONIN I

## 2017-06-02 LAB — CBG MONITORING, ED: Glucose-Capillary: 165 mg/dL — ABNORMAL HIGH (ref 65–99)

## 2017-06-02 LAB — MAGNESIUM: Magnesium: 1.6 mg/dL — ABNORMAL LOW (ref 1.7–2.4)

## 2017-06-02 MED ORDER — SODIUM CHLORIDE 0.9 % IV BOLUS (SEPSIS)
1000.0000 mL | Freq: Once | INTRAVENOUS | Status: AC
  Administered 2017-06-02: 1000 mL via INTRAVENOUS

## 2017-06-02 MED ORDER — MAGNESIUM SULFATE 2 GM/50ML IV SOLN
2.0000 g | Freq: Once | INTRAVENOUS | Status: AC
Start: 2017-06-02 — End: 2017-06-02
  Administered 2017-06-02: 2 g via INTRAVENOUS
  Filled 2017-06-02: qty 50

## 2017-06-02 MED ORDER — MAGNESIUM OXIDE 400 MG PO CAPS: 400.0000 mg | ORAL_CAPSULE | Freq: Every day | ORAL | 0 refills | Status: AC

## 2017-06-02 NOTE — ED Notes (Signed)
[place] at the bedside. Pt returned from [place]

## 2017-06-02 NOTE — ED Notes (Signed)
Pt taken to Xray.

## 2017-06-02 NOTE — Discharge Instructions (Signed)
I suspect your symptoms today were due to a combination of VASOVAGAL syncope and DEHYDRATION.  Your Magnesium was also low; call your doctor to set up an appointment for a re-check this week.  You may also benefit from an echocardiogram; discuss with Dr. Ronnald Ramp.  BE CAREFUL WHEN GOING FROM SITTING TO STANDING, CHANGING POSITIONS, OR WHEN HAVING A BOWEL MOVEMENT; TRY NOT TO STRAIN TOO HARD AND IF YOU GET LIGHTHEADED, STOP WHAT YOU ARE DOING ANY LAY DOWN  DO NOT DRIVE UNTIL SEEN BY DR. Ronnald Ramp

## 2017-06-02 NOTE — ED Provider Notes (Signed)
[facility] DEPT Provider Note   CSN: [id number] Arrival date & time: 06/02/17  7262     History   Chief Complaint Chief Complaint  Patient presents with  . Loss of Consciousness    HPI Brandon Shannon is a 61 y.o. male.  HPI   61 year old male with past medical history of hypertension, hyperlipidemia, diabetes, who presents with loss of consciousness. The patient was in his usual state of health this morning. He went to the bathroom and had a bowel movement. While having the bowel movement, he began to feel mildly lightheaded and like he was going to pass out. He reportedly tried to stand up and walked to the other room when he collapsed. His wife noticed this and came to his aid. He reportedly urinated on himself. There was no shaking activity. Patient regained consciousness on the floor, and was mildly confused for several seconds but then back to his baseline. He continues to feel mildly lightheaded. He states he broke out in a sweat prior to this with tunnel vision. He denies any associated chest pain or shortness of breath. He felt well until the episode today.  Past Medical History:  Diagnosis Date  . AKI (acute kidney injury) ([place])   . Arthritis    "hands, knees" (08/28/2016)  . CAP (community acquired pneumonia) 08/28/2016  . GERD (gastroesophageal reflux disease)   . Hyperlipidemia   . Hypertension   . Pneumonia ~ 2015  . Seasonal allergies   . Syncope   . Syncope and collapse 08/28/2016  . Type II diabetes mellitus [facility])     Patient Active Problem List   Diagnosis Date Noted  . Routine general medical examination at a health care facility 09/08/2016  . BPH with obstruction/lower urinary tract symptoms 09/07/2016  . Paresthesia of both feet 09/16/2015  . Screening for colon cancer 09/17/2014  . Elevated total protein 04/08/2014  . GAD (generalized anxiety disorder) 03/04/2014  . Unspecified vitamin D deficiency 04/02/2013  . Type II diabetes mellitus with  manifestations ([place]) 09/18/2007  . Hyperlipidemia with target LDL less than 100 04/01/2007  . Obesity 04/01/2007  . Essential hypertension 04/01/2007    Past Surgical History:  Procedure Laterality Date  . BUNIONECTOMY WITH HAMMERTOE RECONSTRUCTION Bilateral   . COLONOSCOPY  10-27-2004   normal       Home Medications    Prior to Admission medications   Medication Sig Start Date End Date Taking? Authorizing Provider  ALPRAZolam Duanne Moron) 0.5 MG tablet Take 0.5 mg by mouth daily as needed for anxiety.   Yes [provider]  amLODipine (NORVASC) 5 MG tablet Take 1 tablet (5 mg total) by mouth daily. 11/03/16  Yes Nche, Charlene Brooke, NP  Azilsartan Medoxomil (EDARBI) 40 MG TABS Take 40 mg by mouth daily.   Yes [provider]  cyclobenzaprine (FLEXERIL) 10 MG tablet Take 10 mg by mouth 2 (two) times daily as needed for muscle spasms.   Yes [provider]  gabapentin (NEURONTIN) 100 MG capsule Take 100 mg by mouth 2 (two) times daily.   Yes [provider]  hydrOXYzine (ATARAX/VISTARIL) 10 MG tablet Take 20 mg by mouth at bedtime as needed for itching.   Yes [provider]  meloxicam (MOBIC) 7.5 MG tablet Take 7.5 mg by mouth daily.   Yes [provider]  metFORMIN (GLUCOPHAGE XR) 750 MG 24 hr tablet Take 2 tablets (1,500 mg total) by mouth daily with breakfast. 09/16/15  Yes Janith Lima, MD  rosuvastatin (  CRESTOR) 40 MG tablet Take 40 mg by mouth daily.   Yes [provider]  tamsulosin (FLOMAX) 0.4 MG CAPS capsule Take 1 capsule (0.4 mg total) by mouth daily after supper. 09/07/16  Yes Janith Lima, MD  aspirin EC 81 MG tablet Take 1 tablet (81 mg total) by mouth daily. Patient not taking: Reported on 06/02/2017 09/07/16   Janith Lima, MD  EDARBI 40 MG TABS TAKE 1 TABLET DAILY 04/19/17   Janith Lima, MD  Magnesium Oxide 400 MG CAPS Take 1 capsule (400 mg total) by mouth daily. 06/02/17 06/07/17  Duffy Bruce, MD     Family History Family History  Problem Relation Age of Onset  . Diabetes Mother   . Heart disease Mother   . Hypertension Mother   . Arthritis Mother   . Hypertension Sister   . Hypertension Sister   . Cancer Neg Hx   . Alcohol abuse Neg Hx   . Drug abuse Neg Hx   . Early death Neg Hx   . Kidney disease Neg Hx   . Stroke Neg Hx   . Colon cancer Neg Hx     Social History Social History  Substance Use Topics  . Smoking status: Never Smoker  . Smokeless tobacco: Never Used  . Alcohol use 2.4 oz/week    4 Glasses of wine per week     Allergies   Patient has no known allergies.   Review of Systems Review of Systems  Constitutional: Positive for fatigue. Negative for chills and fever.  HENT: Negative for congestion and rhinorrhea.   Eyes: Negative for visual disturbance.  Respiratory: Negative for cough, shortness of breath and wheezing.   Cardiovascular: Negative for chest pain and leg swelling.  Gastrointestinal: Negative for abdominal pain, diarrhea, nausea and vomiting.  Genitourinary: Negative for dysuria and flank pain.  Musculoskeletal: Negative for neck pain and neck stiffness.  Skin: Negative for rash and wound.  Allergic/Immunologic: Negative for immunocompromised state.  Neurological: Positive for syncope. Negative for weakness and headaches.  All other systems reviewed and are negative.    Physical Exam Updated Vital Signs BP 131/80   Pulse 92   Temp (!) 97.5 F (36.4 C) (Oral)   Resp 18   Ht 5\' 11"  (1.803 m)   Wt 106.6 kg (235 lb)   SpO2 100%   BMI 32.78 kg/m   Physical Exam  Constitutional: He is oriented to person, place, and time. He appears well-developed and well-nourished. No distress.  HENT:  Head: Normocephalic and atraumatic.  Eyes: Conjunctivae are normal.  Neck: Neck supple.  Cardiovascular: Normal rate, regular rhythm and normal heart sounds.  Exam reveals no friction rub.   No murmur heard. Pulmonary/Chest: Effort normal  and breath sounds normal. No respiratory distress. He has no wheezes. He has no rales.  Abdominal: He exhibits no distension.  Musculoskeletal: He exhibits no edema.  Neurological: He is alert and oriented to person, place, and time. He exhibits normal muscle tone.  Skin: Skin is warm. Capillary refill takes less than 2 seconds.  Psychiatric: He has a normal mood and affect.  Nursing note and vitals reviewed.    ED Treatments / Results  Labs (all labs ordered are listed, but only abnormal results are displayed) Labs Reviewed  BASIC METABOLIC PANEL - Abnormal; Notable for the following:       Result Value   Glucose, Bld 172 (*)    Calcium 8.5 (*)    All other components within  normal limits  CBC - Abnormal; Notable for the following:    Platelets 136 (*)    All other components within normal limits  MAGNESIUM - Abnormal; Notable for the following:    Magnesium 1.6 (*)    All other components within normal limits  CBG MONITORING, ED - Abnormal; Notable for the following:    Glucose-Capillary 165 (*)    All other components within normal limits  URINALYSIS, ROUTINE W REFLEX MICROSCOPIC  TROPONIN I    EKG  EKG Interpretation  Date/Time:  Saturday June 02 2017 08:59:45 EDT Ventricular Rate:  92 PR Interval:    QRS Duration: 81 QT Interval:  388 QTC Calculation: 480 R Axis:   72 Text Interpretation:  Sinus rhythm Borderline prolonged QT interval No significant change since last tracing Confirmed by Duffy Bruce [phone]) on 06/02/2017 10:02:16 AM       Radiology Dg Chest 2 View  Result Date: 06/02/2017 CLINICAL DATA:  Syncope. EXAM: CHEST  2 VIEW COMPARISON:  Chest x-ray dated September 07, 2016. FINDINGS: The heart size and mediastinal contours are within normal limits. Both lungs are clear. The visualized skeletal structures are unremarkable. IMPRESSION: No active cardiopulmonary disease. Electronically Signed   By: Titus Dubin M.D.   On: 06/02/2017 10:55   Ct  Head Wo Contrast  Result Date: 06/02/2017 CLINICAL DATA:  Pt states he slipped and fell this morning in the bathroom. Denies LOC. Does not think he struck his head. EXAM: CT HEAD WITHOUT CONTRAST TECHNIQUE: Contiguous axial images were obtained from the base of the skull through the vertex without intravenous contrast. COMPARISON:  08/28/2016 FINDINGS: Brain: No evidence of acute infarction, hemorrhage, hydrocephalus, extra-axial collection or mass lesion/mass effect. Mild generalized atrophy. Vascular: No hyperdense vessel or unexpected calcification. Skull: No osseous abnormality. Sinuses/Orbits: Visualized paranasal sinuses are clear. Visualized mastoid sinuses are clear. Visualized orbits demonstrate no focal abnormality. Other: None IMPRESSION: 1. No acute intracranial pathology. Electronically Signed   By: Kathreen Devoid   On: 06/02/2017 10:20    Procedures Procedures (including critical care time)  Medications Ordered in ED Medications  sodium chloride 0.9 % bolus 1,000 mL (0 mLs Intravenous Stopped 06/02/17 1054)  magnesium sulfate IVPB 2 g 50 mL (0 g Intravenous Stopped 06/02/17 1225)     Initial Impression / Assessment and Plan / ED Course  I have reviewed the triage vital signs and the nursing notes.  Pertinent labs & imaging results that were available during my care of the patient were reviewed by me and considered in my medical decision making (see chart for details).     61 yo M here with syncopal episode in setting of straining to have a BM. No CP, SOB, or palpitations. Lab work is overall reassuring here. He is not anemic. He feels improved with IVF and is no longer orthostatic. Lytes show mild hypomag, which I suspect is 2/2 his medications and EtOH use but this has been replaced. EKG initially with borderline QT prolongation, <450 on repeat and I do not suspect long QT or transient VTach. History highly c/w vasovagal/defecation related syncope and his sx totally resolved with  lying flat.  He has no murmurs or signs of valvulopathy. No signs of ischemia. CT head, CXR negative.   Discussed labs, dx with pt and family. He feels better now and would like to manage as outpt. Will have him replete mag, f/u with his PCP. Low risk on SF syncope rule.  Final Clinical Impressions(s) / ED Diagnoses  Final diagnoses:  Vasovagal syncope  Hypomagnesemia    New Prescriptions Discharge Medication List as of 06/02/2017 12:26 PM    START taking these medications   Details  Magnesium Oxide 400 MG CAPS Take 1 capsule (400 mg total) by mouth daily., Starting Sat 06/02/2017, Until Thu 06/07/2017, Print         Duffy Bruce, MD 06/02/17 2130

## 2017-06-02 NOTE — ED Notes (Signed)
Pt given water and drinking.

## 2017-06-02 NOTE — ED Notes (Signed)
Pt ambulated through the hallway to the bathroom. Reports that he feels better than he did before. Steady on his feet. No other complaints.

## 2017-06-02 NOTE — ED Notes (Signed)
Pt returned from CT °

## 2017-06-02 NOTE — ED Notes (Signed)
MD Bland Span at the bedside aware of the patient's symptoms

## 2017-06-02 NOTE — ED Triage Notes (Signed)
Per [facility], Pt is coming from home where he reports going to the bathroom, having a bowel movement, and falling off the toilet with syncopal episode. Pt was reported to be diaphoretic when EMS arrived. No Neuro deficits noted. Vitals:  CBG 120, 110 Sitting, 120-130 Standing HR, 118/78 BP, 99% on RA, 20s RR.   Pt has 18 Gauge in L FA given 800 CC of NS.

## 2017-06-02 NOTE — ED Notes (Signed)
Assisted pt using bedpan

## 2017-06-04 ENCOUNTER — Ambulatory Visit (INDEPENDENT_AMBULATORY_CARE_PROVIDER_SITE_OTHER): Admitting: Nurse Practitioner

## 2017-06-04 ENCOUNTER — Encounter: Payer: Self-pay | Admitting: Nurse Practitioner

## 2017-06-04 VITALS — BP 132/82 | HR 79 | Temp 97.5°F | Ht 71.0 in | Wt 236.0 lb

## 2017-06-04 DIAGNOSIS — R55 Syncope and collapse: Secondary | ICD-10-CM

## 2017-06-04 DIAGNOSIS — I10 Essential (primary) hypertension: Secondary | ICD-10-CM

## 2017-06-04 NOTE — Patient Instructions (Addendum)
Normal BMP and magnesium. No additional magnesium needed. Maintain current medication. Encourage adequate oral hydration.  Dehydration, Adult Dehydration is a condition in which there is not enough fluid or water in the body. This happens when you lose more fluids than you take in. Important organs, such as the kidneys, brain, and heart, cannot function without a proper amount of fluids. Any loss of fluids from the body can lead to dehydration. Dehydration can range from mild to severe. This condition should be treated right away to prevent it from becoming severe. What are the causes? This condition may be caused by:  Vomiting.  Diarrhea.  Excessive sweating, such as from heat exposure or exercise.  Not drinking enough fluid, especially: ? When ill. ? While doing activity that requires a lot of energy.  Excessive urination.  Fever.  Infection.  Certain medicines, such as medicines that cause the body to lose excess fluid (diuretics).  Inability to access safe drinking water.  Reduced physical ability to get adequate water and food.  What increases the risk? This condition is more likely to develop in people:  Who have a poorly controlled long-term (chronic) illness, such as diabetes, heart disease, or kidney disease.  Who are age 36 or older.  Who are disabled.  Who live in a place with high altitude.  Who play endurance sports.  What are the signs or symptoms? Symptoms of mild dehydration may include:  Thirst.  Dry lips.  Slightly dry mouth.  Dry, warm skin.  Dizziness. Symptoms of moderate dehydration may include:  Very dry mouth.  Muscle cramps.  Dark urine. Urine may be the color of tea.  Decreased urine production.  Decreased tear production.  Heartbeat that is irregular or faster than normal (palpitations).  Headache.  Light-headedness, especially when you stand up from a sitting position.  Fainting (syncope). Symptoms of severe  dehydration may include:  Changes in skin, such as: ? Cold and clammy skin. ? Blotchy (mottled) or pale skin. ? Skin that does not quickly return to normal after being lightly pinched and released (poor skin turgor).  Changes in body fluids, such as: ? Extreme thirst. ? No tear production. ? Inability to sweat when body temperature is high, such as in hot weather. ? Very little urine production.  Changes in vital signs, such as: ? Weak pulse. ? Pulse that is more than 100 beats a minute when sitting still. ? Rapid breathing. ? Low blood pressure.  Other changes, such as: ? Sunken eyes. ? Cold hands and feet. ? Confusion. ? Lack of energy (lethargy). ? Difficulty waking up from sleep. ? Short-term weight loss. ? Unconsciousness. How is this diagnosed? This condition is diagnosed based on your symptoms and a physical exam. Blood and urine tests may be done to help confirm the diagnosis. How is this treated? Treatment for this condition depends on the severity. Mild or moderate dehydration can often be treated at home. Treatment should be started right away. Do not wait until dehydration becomes severe. Severe dehydration is an emergency and it needs to be treated in a hospital. Treatment for mild dehydration may include:  Drinking more fluids.  Replacing salts and minerals in your blood (electrolytes) that you may have lost. Treatment for moderate dehydration may include:  Drinking an oral rehydration solution (ORS). This is a drink that helps you replace fluids and electrolytes (rehydrate). It can be found at pharmacies and retail stores. Treatment for severe dehydration may include:  Receiving fluids through an IV  tube.  Receiving an electrolyte solution through a feeding tube that is passed through your nose and into your stomach (nasogastric tube, or NG tube).  Correcting any abnormalities in electrolytes.  Treating the underlying cause of dehydration. Follow these  instructions at home:  If directed by your health care provider, drink an ORS: ? Make an ORS by following instructions on the package. ? Start by drinking small amounts, about  cup (120 mL) every 5-10 minutes. ? Slowly increase how much you drink until you have taken the amount recommended by your health care provider.  Drink enough clear fluid to keep your urine clear or pale yellow. If you were told to drink an ORS, finish the ORS first, then start slowly drinking other clear fluids. Drink fluids such as: ? Water. Do not drink only water. Doing that can lead to having too little salt (sodium) in the body (hyponatremia). ? Ice chips. ? Fruit juice that you have added water to (diluted fruit juice). ? Low-calorie sports drinks.  Avoid: ? Alcohol. ? Drinks that contain a lot of sugar. These include high-calorie sports drinks, fruit juice that is not diluted, and soda. ? Caffeine. ? Foods that are greasy or contain a lot of fat or sugar.  Take over-the-counter and prescription medicines only as told by your health care provider.  Do not take sodium tablets. This can lead to having too much sodium in the body (hypernatremia).  Eat foods that contain a healthy balance of electrolytes, such as bananas, oranges, potatoes, tomatoes, and spinach.  Keep all follow-up visits as told by your health care provider. This is important. Contact a health care provider if:  You have abdominal pain that: ? Gets worse. ? Stays in one area (localizes).  You have a rash.  You have a stiff neck.  You are more irritable than usual.  You are sleepier or more difficult to wake up than usual.  You feel weak or dizzy.  You feel very thirsty.  You have urinated only a small amount of very dark urine over 6-8 hours. Get help right away if:  You have symptoms of severe dehydration.  You cannot drink fluids without vomiting.  Your symptoms get worse with treatment.  You have a fever.  You  have a severe headache.  You have vomiting or diarrhea that: ? Gets worse. ? Does not go away.  You have blood or green matter (bile) in your vomit.  You have blood in your stool. This may cause stool to look black and tarry.  You have not urinated in 6-8 hours.  You faint.  Your heart rate while sitting still is over 100 beats a minute.  You have trouble breathing. This information is not intended to replace advice given to you by your health care provider. Make sure you discuss any questions you have with your health care provider. Document Released: 08/14/2005 Document Revised: 03/10/2016 Document Reviewed: 10/08/2015 Elsevier Interactive Patient Education  [employer].

## 2017-06-04 NOTE — Progress Notes (Signed)
Subjective:  Patient ID: Brandon Shannon, male    DOB: August 03, 1956  Age: 61 y.o. MRN: [id number]  CC: Hospitalization Follow-up (ER follow up--BP med consult.)   Loss of Consciousness  This is a new problem. Episode onset: 2days ago. The problem has been resolved. Exacerbated by: urination and BM. Associated symptoms include bladder incontinence, light-headedness and a visual change. Pertinent negatives include no auditory change, back pain, bowel incontinence, chest pain, clumsiness, diaphoresis, dizziness, fever, focal sensory loss, focal weakness, headaches, malaise/fatigue, nausea, palpitations, slurred speech, vertigo, vomiting or weakness. Associated symptoms comments: Tunnel vision and lightheadedness before losing consciousness.. He has tried bed rest for the symptoms. The treatment provided significant relief. His past medical history is significant for HTN. There is no history of arrhythmia, CAD, a clotting disorder, CVA, DM, seizures, a sudden death in family, TIA or vertigo.   Brandon Shannon went to ED due to syncopal episode witnessed by wife at home. Occurred after using bathroom (BM). No seizure activity noted, no postictal symptoms noted either. While is ED, he was found to be dehydrated with hypomagnesemia.  Syncope was thought to be secondary to dehydration.  He is accompanied by wife today. He denies any acute complains today. He wants to know if symptoms could be related to [place]? Will it be possible to change medication. He reports that he goes Golfing for 4hrs 3-4times a week. Admits to inadequate oral hydration and food intake with he the day he plays.  Labs results, EKG and head CT reviewed.  Medication list reconciled with patient and wife Outpatient Medications Prior to Visit  Medication Sig Dispense Refill  . ALPRAZolam (XANAX) 0.5 MG tablet Take 0.5 mg by mouth daily as needed for anxiety.    Marland Kitchen amLODipine (NORVASC) 5 MG tablet Take 1 tablet (5 mg total) by mouth daily.  30 tablet 3  . Azilsartan Medoxomil (EDARBI) 40 MG TABS Take 40 mg by mouth daily.    . cyclobenzaprine (FLEXERIL) 10 MG tablet Take 10 mg by mouth 2 (two) times daily as needed for muscle spasms.    Marland Kitchen EDARBI 40 MG TABS TAKE 1 TABLET DAILY 90 tablet 0  . gabapentin (NEURONTIN) 100 MG capsule Take 100 mg by mouth 2 (two) times daily.    . hydrOXYzine (ATARAX/VISTARIL) 10 MG tablet Take 20 mg by mouth at bedtime as needed for itching.    . Magnesium Oxide 400 MG CAPS Take 1 capsule (400 mg total) by mouth daily. 5 capsule 0  . meloxicam (MOBIC) 7.5 MG tablet Take 7.5 mg by mouth daily.    . metFORMIN (GLUCOPHAGE XR) 750 MG 24 hr tablet Take 2 tablets (1,500 mg total) by mouth daily with breakfast. 180 tablet 1  . rosuvastatin (CRESTOR) 40 MG tablet Take 40 mg by mouth daily.    . tamsulosin (FLOMAX) 0.4 MG CAPS capsule Take 1 capsule (0.4 mg total) by mouth daily after supper. 90 capsule 1  . aspirin EC 81 MG tablet Take 1 tablet (81 mg total) by mouth daily. (Patient not taking: Reported on 06/02/2017) 90 tablet 3   No facility-administered medications prior to visit.     ROS See HPI  Objective:  BP 132/82   Pulse 79   Temp (!) 97.5 F (36.4 C)   Ht 5\' 11"  (1.803 m)   Wt 236 lb (107 kg)   SpO2 98%   BMI 32.92 kg/m   BP Readings from Last 3 Encounters:  06/04/17 132/82  06/02/17 131/80  12/04/16 130/80  Wt Readings from Last 3 Encounters:  06/04/17 236 lb (107 kg)  06/02/17 235 lb (106.6 kg)  12/04/16 234 lb (106.1 kg)    Physical Exam  Constitutional: No distress.  Cardiovascular: Normal rate and regular rhythm.   Pulmonary/Chest: Effort normal and breath sounds normal.  Musculoskeletal: He exhibits no edema.  Neurological: He is alert.  Skin: Skin is warm and dry.  Vitals reviewed.   Lab Results  Component Value Date   WBC 9.6 06/02/2017   HGB 13.3 06/02/2017   HCT 39.7 06/02/2017   PLT 136 (L) 06/02/2017   GLUCOSE 141 (H) 06/11/2017   CHOL 101 09/07/2016     TRIG 77.0 09/07/2016   HDL 41.00 09/07/2016   LDLCALC 45 09/07/2016   ALT 28 09/07/2016   AST 20 09/07/2016   NA 139 06/11/2017   K 3.9 06/11/2017   CL 104 06/11/2017   CREATININE 1.01 06/11/2017   BUN 12 06/11/2017   CO2 24 06/11/2017   TSH 0.96 09/16/2015   PSA 0.67 09/07/2016   INR 1.14 08/28/2016   HGBA1C 6.7 (H) 12/04/2016   MICROALBUR 0.8 09/07/2016    Dg Chest 2 View  Result Date: 06/02/2017 CLINICAL DATA:  Syncope. EXAM: CHEST  2 VIEW COMPARISON:  Chest x-ray dated September 07, 2016. FINDINGS: The heart size and mediastinal contours are within normal limits. Both lungs are clear. The visualized skeletal structures are unremarkable. IMPRESSION: No active cardiopulmonary disease. Electronically Signed   By: Titus Dubin M.D.   On: 06/02/2017 10:55   Ct Head Wo Contrast  Result Date: 06/02/2017 CLINICAL DATA:  Pt states he slipped and fell this morning in the bathroom. Denies LOC. Does not think he struck his head. EXAM: CT HEAD WITHOUT CONTRAST TECHNIQUE: Contiguous axial images were obtained from the base of the skull through the vertex without intravenous contrast. COMPARISON:  08/28/2016 FINDINGS: Brain: No evidence of acute infarction, hemorrhage, hydrocephalus, extra-axial collection or mass lesion/mass effect. Mild generalized atrophy. Vascular: No hyperdense vessel or unexpected calcification. Skull: No osseous abnormality. Sinuses/Orbits: Visualized paranasal sinuses are clear. Visualized mastoid sinuses are clear. Visualized orbits demonstrate no focal abnormality. Other: None IMPRESSION: 1. No acute intracranial pathology. Electronically Signed   By: Kathreen Devoid   On: 06/02/2017 10:20    Assessment & Plan:   Brandon was seen today for hospitalization follow-up.  Diagnoses and all orders for this visit:  Essential hypertension -     Basic metabolic panel; Future -     Magnesium; Future  Vasovagal syncope -     Basic metabolic panel; Future -     Magnesium;  Future   I am having Mr. Shannon maintain his metFORMIN, rosuvastatin, hydrOXYzine, aspirin EC, tamsulosin, amLODipine, EDARBI, Azilsartan Medoxomil, cyclobenzaprine, meloxicam, gabapentin, ALPRAZolam, and Magnesium Oxide.  Meds ordered this encounter  Medications  . Magnesium Oxide 400 (240 Mg) MG TABS    Sig: TK 1 T PO D    Refill:  0    Follow-up: Return if symptoms worsen or fail to improve.  Wilfred Lacy, NP

## 2017-06-11 ENCOUNTER — Other Ambulatory Visit (INDEPENDENT_AMBULATORY_CARE_PROVIDER_SITE_OTHER)

## 2017-06-11 DIAGNOSIS — I10 Essential (primary) hypertension: Secondary | ICD-10-CM | POA: Diagnosis not present

## 2017-06-11 DIAGNOSIS — R55 Syncope and collapse: Secondary | ICD-10-CM

## 2017-06-11 LAB — BASIC METABOLIC PANEL
BUN: 12 mg/dL (ref 6–23)
CALCIUM: 9.7 mg/dL (ref 8.4–10.5)
CO2: 24 mEq/L (ref 19–32)
CREATININE: 1.01 mg/dL (ref 0.40–1.50)
Chloride: 104 mEq/L (ref 96–112)
GFR: 95.85 mL/min (ref >60.00)
GLUCOSE: 141 mg/dL — AB (ref 70–99)
Potassium: 3.9 mEq/L (ref 3.5–5.1)
Sodium: 139 mEq/L (ref 135–145)

## 2017-06-11 LAB — MAGNESIUM: MAGNESIUM: 2 mg/dL (ref 1.5–2.5)

## 2017-06-28 ENCOUNTER — Encounter: Payer: Self-pay | Admitting: Internal Medicine

## 2017-06-28 ENCOUNTER — Ambulatory Visit (INDEPENDENT_AMBULATORY_CARE_PROVIDER_SITE_OTHER): Admitting: Internal Medicine

## 2017-06-28 VITALS — BP 116/80 | HR 78 | Temp 98.0°F | Resp 16 | Ht 71.0 in | Wt 236.0 lb

## 2017-06-28 DIAGNOSIS — Z23 Encounter for immunization: Secondary | ICD-10-CM | POA: Diagnosis not present

## 2017-06-28 DIAGNOSIS — I10 Essential (primary) hypertension: Secondary | ICD-10-CM

## 2017-06-28 DIAGNOSIS — R778 Other specified abnormalities of plasma proteins: Secondary | ICD-10-CM

## 2017-06-28 DIAGNOSIS — E118 Type 2 diabetes mellitus with unspecified complications: Secondary | ICD-10-CM

## 2017-06-28 LAB — POCT GLYCOSYLATED HEMOGLOBIN (HGB A1C): HEMOGLOBIN A1C: 6.4

## 2017-06-28 MED ORDER — CARVEDILOL 3.125 MG PO TABS: 3.1250 mg | ORAL_TABLET | Freq: Two times a day (BID) | ORAL | 1 refills | Status: DC

## 2017-06-28 NOTE — Patient Instructions (Signed)

## 2017-06-28 NOTE — Progress Notes (Signed)
Subjective:  Patient ID: Brandon Shannon, male    DOB: 1956/08/24  Age: 61 y.o. MRN: [id number]  CC: Hypertension and Diabetes   HPI Brandon Shannon presents for f/up -  Outpatient Medications Prior to Visit  Medication Sig Dispense Refill  . ALPRAZolam (XANAX) 0.5 MG tablet Take 0.5 mg by mouth daily as needed for anxiety.    Marland Kitchen aspirin EC 81 MG tablet Take 1 tablet (81 mg total) by mouth daily. 90 tablet 3  . cyclobenzaprine (FLEXERIL) 10 MG tablet Take 10 mg by mouth 2 (two) times daily as needed for muscle spasms.    Marland Kitchen EDARBI 40 MG TABS TAKE 1 TABLET DAILY 90 tablet 0  . gabapentin (NEURONTIN) 100 MG capsule Take 100 mg by mouth 2 (two) times daily.    . Magnesium Oxide 400 (240 Mg) MG TABS TK 1 T PO D  0  . meloxicam (MOBIC) 7.5 MG tablet Take 7.5 mg by mouth daily.    . metFORMIN (GLUCOPHAGE XR) 750 MG 24 hr tablet Take 2 tablets (1,500 mg total) by mouth daily with breakfast. 180 tablet 1  . rosuvastatin (CRESTOR) 40 MG tablet Take 40 mg by mouth daily.    . tamsulosin (FLOMAX) 0.4 MG CAPS capsule Take 1 capsule (0.4 mg total) by mouth daily after supper. 90 capsule 1  . amLODipine (NORVASC) 5 MG tablet Take 1 tablet (5 mg total) by mouth daily. 30 tablet 3  . Azilsartan Medoxomil (EDARBI) 40 MG TABS Take 40 mg by mouth daily.    . hydrOXYzine (ATARAX/VISTARIL) 10 MG tablet Take 20 mg by mouth at bedtime as needed for itching.     No facility-administered medications prior to visit.     ROS Review of Systems  Constitutional: Negative.  Negative for appetite change, diaphoresis, fatigue and unexpected weight change.  HENT: Negative.  Negative for sinus pressure and trouble swallowing.   Eyes: Negative.   Respiratory: Negative.  Negative for cough, chest tightness, shortness of breath and wheezing.   Cardiovascular: Positive for leg swelling. Negative for chest pain and palpitations.  Gastrointestinal: Negative for abdominal pain, constipation, diarrhea, nausea and vomiting.    Endocrine: Negative.   Genitourinary: Negative.  Negative for difficulty urinating.  Musculoskeletal: Negative for back pain, gait problem and neck pain.  Skin: Negative.   Allergic/Immunologic: Negative.   Neurological: Negative.  Negative for dizziness, weakness, light-headedness, numbness and headaches.  Hematological: Negative for adenopathy. Does not bruise/bleed easily.  Psychiatric/Behavioral: Negative.     Objective:  BP 116/80 (BP Location: Left Arm, Patient Position: Sitting, Cuff Size: Large)   Pulse 78   Temp 98 F (36.7 C) (Oral)   Resp 16   Ht 5\' 11"  (1.803 m)   Wt 236 lb (107 kg)   SpO2 98%   BMI 32.92 kg/m   BP Readings from Last 3 Encounters:  06/28/17 116/80  06/04/17 132/82  06/02/17 131/80    Wt Readings from Last 3 Encounters:  06/28/17 236 lb (107 kg)  06/04/17 236 lb (107 kg)  06/02/17 235 lb (106.6 kg)    Physical Exam  Constitutional: He is oriented to person, place, and time. No distress.  HENT:  Mouth/Throat: Oropharynx is clear and moist. No oropharyngeal exudate.  Eyes: Conjunctivae are normal. Right eye exhibits no discharge. Left eye exhibits no discharge. No scleral icterus.  Neck: Normal range of motion. Neck supple. No JVD present. No thyromegaly present.  Cardiovascular: Normal rate, regular rhythm and intact distal pulses. Exam reveals  no gallop and no friction rub.  No murmur heard. Pulmonary/Chest: Effort normal and breath sounds normal. No respiratory distress. He has no wheezes. He has no rales. He exhibits no tenderness.  Abdominal: Soft. Bowel sounds are normal. He exhibits no distension and no mass. There is no tenderness. There is no rebound and no guarding.  Musculoskeletal: Normal range of motion. He exhibits edema (trace edema over BLE). He exhibits no tenderness or deformity.  Lymphadenopathy:    He has no cervical adenopathy.  Neurological: He is alert and oriented to person, place, and time.  Skin: Skin is warm and  dry. No rash noted. He is not diaphoretic. No erythema.  Vitals reviewed.   Lab Results  Component Value Date   WBC 9.6 06/02/2017   HGB 13.3 06/02/2017   HCT 39.7 06/02/2017   PLT 136 (L) 06/02/2017   GLUCOSE 141 (H) 06/11/2017   CHOL 101 09/07/2016   TRIG 77.0 09/07/2016   HDL 41.00 09/07/2016   LDLCALC 45 09/07/2016   ALT 28 09/07/2016   AST 20 09/07/2016   NA 139 06/11/2017   K 3.9 06/11/2017   CL 104 06/11/2017   CREATININE 1.01 06/11/2017   BUN 12 06/11/2017   CO2 24 06/11/2017   TSH 0.96 09/16/2015   PSA 0.67 09/07/2016   INR 1.14 08/28/2016   HGBA1C 6.4 06/28/2017   MICROALBUR 0.8 09/07/2016    Dg Chest 2 View  Result Date: 06/02/2017 CLINICAL DATA:  Syncope. EXAM: CHEST  2 VIEW COMPARISON:  Chest x-ray dated September 07, 2016. FINDINGS: The heart size and mediastinal contours are within normal limits. Both lungs are clear. The visualized skeletal structures are unremarkable. IMPRESSION: No active cardiopulmonary disease. Electronically Signed   By: Titus Dubin M.D.   On: 06/02/2017 10:55   Ct Head Wo Contrast  Result Date: 06/02/2017 CLINICAL DATA:  Pt states he slipped and fell this morning in the bathroom. Denies LOC. Does not think he struck his head. EXAM: CT HEAD WITHOUT CONTRAST TECHNIQUE: Contiguous axial images were obtained from the base of the skull through the vertex without intravenous contrast. COMPARISON:  08/28/2016 FINDINGS: Brain: No evidence of acute infarction, hemorrhage, hydrocephalus, extra-axial collection or mass lesion/mass effect. Mild generalized atrophy. Vascular: No hyperdense vessel or unexpected calcification. Skull: No osseous abnormality. Sinuses/Orbits: Visualized paranasal sinuses are clear. Visualized mastoid sinuses are clear. Visualized orbits demonstrate no focal abnormality. Other: None IMPRESSION: 1. No acute intracranial pathology. Electronically Signed   By: Kathreen Devoid   On: 06/02/2017 10:20    Assessment & Plan:    Brandon Shannon was seen today for hypertension and diabetes.  Diagnoses and all orders for this visit:  Type 2 diabetes mellitus with complication, without long-term current use of insulin ([place])- his A1c is at 6.4%.  Will continue metformin at the current dose. -     Cancel: Comprehensive metabolic panel; Future -     Cancel: Hemoglobin A1c; Future -     POCT glycosylated hemoglobin (Hb A1C)  Essential hypertension- his blood pressure is not adequately well controlled.  I think carvedilol would be a more effective antihypertensive for him than the CCB.  This change will also help reduce the lower extremity edema.  Will avoid diuretics because he tells me previously they caused dizziness and lightheadedness.  Will continue the ARB at the current dose. -     Cancel: Comprehensive metabolic panel; Future -     carvedilol (COREG) 3.125 MG tablet; Take 1 tablet (3.125 mg total) by  mouth 2 (two) times daily with a meal.  Elevated total protein-total protein done earlier this year was within normal limits.  We will continue to monitor for any recurrence of this. -     Cancel: Comprehensive metabolic panel; Future  Need for influenza vaccination -     Flu Vaccine QUAD 36+ mos IM   I have discontinued Brandon Shannon's hydrOXYzine, amLODipine, and Azilsartan Medoxomil. I am also having him start on carvedilol. Additionally, I am having him maintain his metFORMIN, rosuvastatin, aspirin EC, tamsulosin, EDARBI, cyclobenzaprine, meloxicam, gabapentin, ALPRAZolam, and Magnesium Oxide.  Meds ordered this encounter  Medications  . carvedilol (COREG) 3.125 MG tablet    Sig: Take 1 tablet (3.125 mg total) by mouth 2 (two) times daily with a meal.    Dispense:  180 tablet    Refill:  1     Follow-up: Return in about 3 months (around 09/28/2017).  Scarlette Calico, MD

## 2017-07-17 ENCOUNTER — Encounter: Payer: Self-pay | Admitting: Internal Medicine

## 2017-07-18 ENCOUNTER — Other Ambulatory Visit: Payer: Self-pay | Admitting: Internal Medicine

## 2017-07-18 DIAGNOSIS — I10 Essential (primary) hypertension: Secondary | ICD-10-CM

## 2017-07-18 DIAGNOSIS — E118 Type 2 diabetes mellitus with unspecified complications: Secondary | ICD-10-CM

## 2017-09-06 ENCOUNTER — Ambulatory Visit (INDEPENDENT_AMBULATORY_CARE_PROVIDER_SITE_OTHER): Admitting: Family Medicine

## 2017-09-06 ENCOUNTER — Other Ambulatory Visit (INDEPENDENT_AMBULATORY_CARE_PROVIDER_SITE_OTHER)

## 2017-09-06 ENCOUNTER — Encounter: Payer: Self-pay | Admitting: Family Medicine

## 2017-09-06 ENCOUNTER — Ambulatory Visit (INDEPENDENT_AMBULATORY_CARE_PROVIDER_SITE_OTHER)
Admission: RE | Admit: 2017-09-06 | Discharge: 2017-09-06 | Disposition: A | Discharge status: Home / Self Care | Source: Ambulatory Visit | Attending: Family Medicine | Admitting: Family Medicine

## 2017-09-06 VITALS — BP 148/86 | HR 80 | Temp 98.6°F | Ht 71.0 in | Wt 237.0 lb

## 2017-09-06 DIAGNOSIS — R059 Cough, unspecified: Secondary | ICD-10-CM

## 2017-09-06 DIAGNOSIS — R05 Cough: Secondary | ICD-10-CM | POA: Diagnosis not present

## 2017-09-06 DIAGNOSIS — R197 Diarrhea, unspecified: Secondary | ICD-10-CM | POA: Diagnosis not present

## 2017-09-06 LAB — BASIC METABOLIC PANEL
BUN: 18 mg/dL (ref 6–23)
CO2: 31 mEq/L (ref 19–32)
Calcium: 9.8 mg/dL (ref 8.4–10.5)
Chloride: 101 mEq/L (ref 96–112)
Creatinine, Ser: 1.33 mg/dL (ref 0.40–1.50)
GFR: 69.72 mL/min (ref >60.00)
Glucose, Bld: 139 mg/dL — ABNORMAL HIGH (ref 70–99)
Potassium: 3.7 mEq/L (ref 3.5–5.1)
Sodium: 140 mEq/L (ref 135–145)

## 2017-09-06 LAB — CBC
HCT: 44.5 % (ref 39.0–52.0)
Hemoglobin: 14.8 g/dL (ref 13.0–17.0)
MCHC: 33.2 g/dL (ref 30.0–36.0)
MCV: 90.5 fl (ref 78.0–100.0)
Platelets: 201 10*3/uL (ref 150.0–400.0)
RBC: 4.92 Mil/uL (ref 4.22–5.81)
RDW: 13.8 % (ref 11.5–15.5)
WBC: 11.6 10*3/uL — ABNORMAL HIGH (ref 4.0–10.5)

## 2017-09-06 LAB — MAGNESIUM: Magnesium: 1.9 mg/dL (ref 1.5–2.5)

## 2017-09-06 NOTE — Assessment & Plan Note (Signed)
Most likely associated with his antibiotic use. It started after antibiotic was initiated  - stop ABX - advised to follow up if diarrhea continues

## 2017-09-06 NOTE — Assessment & Plan Note (Deleted)
Uncontrolled. His recent readings were elevated. Has restarted medication and blood pressure has improved - counseled on medication.

## 2017-09-06 NOTE — Assessment & Plan Note (Signed)
Likely viral in nature. No fever. He was admitted two times last year for pneumonia   - counseled on supportive care  - Chest xray  - BMP, CBC and magnesium

## 2017-09-06 NOTE — Progress Notes (Signed)
Brandon Shannon - 62 y.o. male MRN [id number]  Date of birth: [birth year]  SUBJECTIVE:  Including CC & ROS.  Chief Complaint  Patient presents with  . Diarrhea    Brandon Shannon  is a 62 y.o. male that is presenting with diarrhea. He went to the [facility] and was diagnosed with a sinus infection. He has been taking Augmentin and prednisone. Since starting Augmentin he has been experiencing diarrhea. Denies abdominal pain, chills, fevers or headaches. The symptoms have been present for about 5 days. He feels like they are staying the same. He has not noticed a significant improvement with the antibiotic. His diarrhea is loose and nonbloody. He denies any abdominal pain. He was admitted 2 times last year. He was admitted once for syncope and once for pneumonia. He has concerns that he may develop some of those symptoms.  CT of his abdomen from 2010 was normal but could've possibly been associated with mild colitis.  Reviewed this chest x-ray from October 2018 shows no disease.  Review of Systems  Constitutional: Negative for fever.  HENT: Positive for sinus pressure.   Respiratory: Positive for cough. Negative for shortness of breath.   Cardiovascular: Negative for chest pain.  Gastrointestinal: Negative for abdominal pain.    HISTORY: Past Medical, Surgical, Social, and Family History Reviewed & Updated per EMR.   Pertinent Historical Findings include:  Past Medical History:  Diagnosis Date  . AKI (acute kidney injury) ([place])   . Arthritis    "hands, knees" (08/28/2016)  . CAP (community acquired pneumonia) 08/28/2016  . GERD (gastroesophageal reflux disease)   . Hyperlipidemia   . Hypertension   . Pneumonia ~ 2015  . Seasonal allergies   . Syncope   . Syncope and collapse 08/28/2016  . Type II diabetes mellitus ([place])     Past Surgical History:  Procedure Laterality Date  . BUNIONECTOMY WITH HAMMERTOE RECONSTRUCTION Bilateral   . COLONOSCOPY  10-27-2004   normal    No  Known Allergies  Family History  Problem Relation Age of Onset  . Diabetes Mother   . Heart disease Mother   . Hypertension Mother   . Arthritis Mother   . Hypertension Sister   . Hypertension Sister   . Cancer Neg Hx   . Alcohol abuse Neg Hx   . Drug abuse Neg Hx   . Early death Neg Hx   . Kidney disease Neg Hx   . Stroke Neg Hx   . Colon cancer Neg Hx      Social History   Socioeconomic History  . Marital status: Married    Spouse name: Not on file  . Number of children: Not on file  . Years of education: Not on file  . Highest education level: Not on file  Social Needs  . Financial resource strain: Not on file  . Food insecurity - worry: Not on file  . Food insecurity - inability: Not on file  . Transportation needs - medical: Not on file  . Transportation needs - non-medical: Not on file  Occupational History  . Not on file  Tobacco Use  . Smoking status: Never Smoker  . Smokeless tobacco: Never Used  Substance and Sexual Activity  . Alcohol use: Yes    Alcohol/week: 2.4 oz    Types: 4 Glasses of wine per week  . Drug use: No  . Sexual activity: No  Other Topics Concern  . Not on file  Social History Narrative  .  Not on file     PHYSICAL EXAM:  VS: BP (!) 148/86 (BP Location: Left Arm, Patient Position: Sitting, Cuff Size: Normal)   Pulse 80   Temp 98.6 F (37 C) (Oral)   Ht 5\' 11"  (1.803 m)   Wt 237 lb (107.5 kg)   SpO2 99%   BMI 33.05 kg/m  Physical Exam Gen: NAD, alert, cooperative with exam,  ENT: normal lips, normal nasal mucosa, tympanic membranes clear and intact bilaterally, normal oropharynx, no cervical lymphadenopathy Eye: normal EOM, normal conjunctiva and lids CV:  no edema, +2 pedal pulses, regular rate and rhythm, S1-S2   Resp: no accessory muscle use, non-labored, clear to auscultation bilaterally, no crackles or wheezes GI: no masses or tenderness, no hernia  Skin: no rashes, no areas of induration  Neuro: normal tone, normal  sensation to touch Psych:  normal insight, alert and oriented MSK: Normal gait, normal strength       ASSESSMENT & PLAN:   Cough Likely viral in nature. No fever. He was admitted two times last year for pneumonia   - counseled on supportive care  - Chest xray  - BMP, CBC and magnesium   Diarrhea Most likely associated with his antibiotic use. It started after antibiotic was initiated  - stop ABX - advised to follow up if diarrhea continues

## 2017-09-06 NOTE — Patient Instructions (Signed)
Please try things such as zyrtec-D or allegra-D which is an antihistamine and decongestant.   Please try afrin which will help with nasal congestion but use for only three days.   Please also try using a netti pot on a regular occasion.  Honey can help with a sore throat.   Vick's can help with breathing  Delsym can help with cough.

## 2017-09-10 ENCOUNTER — Telehealth: Payer: Self-pay | Admitting: Family Medicine

## 2017-09-10 MED ORDER — AZITHROMYCIN 250 MG PO TABS: 250.0000 mg | ORAL_TABLET | Freq: Every day | ORAL | 0 refills | Status: DC

## 2017-09-10 NOTE — Telephone Encounter (Signed)
Patient returning call [phone]

## 2017-09-10 NOTE — Addendum Note (Signed)
Addended by: Verlene Mayer A on: 09/10/2017 10:26 AM   Modules accepted: Orders

## 2017-09-10 NOTE — Telephone Encounter (Signed)
Azithromycin sent in to [facility]

## 2017-09-10 NOTE — Telephone Encounter (Signed)
Left VM for patient. If he calls back please have him speak with a nurse/CMA and inform that his lab work looked good. He had a small white could which could be a sign of infection. His chest xray showed a mild infiltrate/atelectasis. So if his symptoms haven't improved then could consider azithromycin for 5 days.    If any questions then please take the best time and phone number to call and I will try to call him back.   Rosemarie Ax, MD [facility] 09/10/2017, 8:56 AM

## 2017-09-16 ENCOUNTER — Encounter: Payer: Self-pay | Admitting: Internal Medicine

## 2017-10-16 ENCOUNTER — Other Ambulatory Visit: Payer: Self-pay | Admitting: Internal Medicine

## 2017-10-16 DIAGNOSIS — E118 Type 2 diabetes mellitus with unspecified complications: Secondary | ICD-10-CM

## 2017-10-16 DIAGNOSIS — I10 Essential (primary) hypertension: Secondary | ICD-10-CM

## 2018-01-14 ENCOUNTER — Other Ambulatory Visit: Payer: Self-pay | Admitting: Internal Medicine

## 2018-01-14 DIAGNOSIS — I10 Essential (primary) hypertension: Secondary | ICD-10-CM

## 2018-01-14 DIAGNOSIS — E118 Type 2 diabetes mellitus with unspecified complications: Secondary | ICD-10-CM

## 2018-02-20 ENCOUNTER — Encounter: Payer: Self-pay | Admitting: Internal Medicine

## 2018-02-20 ENCOUNTER — Other Ambulatory Visit (INDEPENDENT_AMBULATORY_CARE_PROVIDER_SITE_OTHER)

## 2018-02-20 ENCOUNTER — Ambulatory Visit (INDEPENDENT_AMBULATORY_CARE_PROVIDER_SITE_OTHER): Admitting: Internal Medicine

## 2018-02-20 VITALS — BP 104/80 | HR 74 | Temp 97.7°F | Ht 71.0 in | Wt 235.0 lb

## 2018-02-20 DIAGNOSIS — R5383 Other fatigue: Secondary | ICD-10-CM | POA: Diagnosis not present

## 2018-02-20 DIAGNOSIS — E118 Type 2 diabetes mellitus with unspecified complications: Secondary | ICD-10-CM

## 2018-02-20 LAB — HEMOGLOBIN A1C: Hgb A1c MFr Bld: 7 % — ABNORMAL HIGH (ref 4.6–6.5)

## 2018-02-20 LAB — LIPID PANEL
CHOL/HDL RATIO: 2
CHOLESTEROL: 116 mg/dL (ref 0–200)
HDL: 50.7 mg/dL (ref >39.00)
LDL CALC: 51 mg/dL (ref 0–99)
NonHDL: 65.05
TRIGLYCERIDES: 69 mg/dL (ref 0.0–149.0)
VLDL: 13.8 mg/dL (ref 0.0–40.0)

## 2018-02-20 LAB — COMPREHENSIVE METABOLIC PANEL
ALT: 21 U/L (ref 0–53)
AST: 24 U/L (ref 0–37)
Albumin: 4.3 g/dL (ref 3.5–5.2)
Alkaline Phosphatase: 70 U/L (ref 39–117)
BUN: 18 mg/dL (ref 6–23)
CALCIUM: 9.8 mg/dL (ref 8.4–10.5)
CHLORIDE: 102 meq/L (ref 96–112)
CO2: 28 meq/L (ref 19–32)
CREATININE: 1.2 mg/dL (ref 0.40–1.50)
GFR: 78.39 mL/min (ref >60.00)
Glucose, Bld: 145 mg/dL — ABNORMAL HIGH (ref 70–99)
POTASSIUM: 4.7 meq/L (ref 3.5–5.1)
SODIUM: 138 meq/L (ref 135–145)
Total Bilirubin: 0.6 mg/dL (ref 0.2–1.2)
Total Protein: 7.6 g/dL (ref 6.0–8.3)

## 2018-02-20 LAB — VITAMIN D 25 HYDROXY (VIT D DEFICIENCY, FRACTURES): VITD: 35.4 ng/mL (ref 30.00–100.00)

## 2018-02-20 LAB — CBC
HEMATOCRIT: 44.7 % (ref 39.0–52.0)
Hemoglobin: 15.3 g/dL (ref 13.0–17.0)
MCHC: 34.3 g/dL (ref 30.0–36.0)
MCV: 91 fl (ref 78.0–100.0)
Platelets: 152 10*3/uL (ref 150.0–400.0)
RBC: 4.91 Mil/uL (ref 4.22–5.81)
RDW: 13.8 % (ref 11.5–15.5)
WBC: 8.9 10*3/uL (ref 4.0–10.5)

## 2018-02-20 LAB — TSH: TSH: 1.2 u[IU]/mL (ref 0.35–4.50)

## 2018-02-20 LAB — VITAMIN B12: Vitamin B-12: 578 pg/mL (ref 211–911)

## 2018-02-20 NOTE — Patient Instructions (Signed)
We will check the labs today.  Try taking melatonin over the counter to help with sleep. This is a safe vitamin that can be taken every night to help you sleep more naturally. It is not addictive. This does not help everyone so give it about 1 week before you decide if it works for you.

## 2018-02-20 NOTE — Progress Notes (Signed)
   Subjective:    Patient ID: Brandon Shannon, male    DOB: Feb 12, 1956, 62 y.o.   MRN: [id number]  HPI The patient is a 62 YO man coming in for not feeling well. He is just feeling worn down and more tired lately. He has been struggling with his sleep for the last several weeks. This is a chronic problem but worse lately. Has xanax to take but uses infrequently as he is worried about addiction and dependence. Does get rest after taking xanax and wakes feeling rested. He does have a good day if he can get adequate sleep. No change in diet. Less exercise. Denies chest pains, chest tightness. Stable SOB (for years). No nausea or vomiting, no diarrhea or constipation. GERD stable and well controlled with otcs. Denies falls or balance problems. Has diabetes and is taking metformin and has not noticed low sugars.   Review of Systems  Constitutional: Positive for activity change and fatigue. Negative for appetite change, chills, fever and unexpected weight change.  HENT: Negative.   Eyes: Negative.   Respiratory: Negative for cough, chest tightness and shortness of breath.   Cardiovascular: Negative for chest pain, palpitations and leg swelling.  Gastrointestinal: Negative for abdominal distention, abdominal pain, constipation, diarrhea, nausea and vomiting.  Musculoskeletal: Negative.   Skin: Negative.   Neurological: Negative.   Psychiatric/Behavioral: Positive for sleep disturbance. Negative for behavioral problems, confusion, decreased concentration, self-injury and suicidal ideas. The patient is not nervous/anxious.       Objective:   Physical Exam  Constitutional: He is oriented to person, place, and time. He appears well-developed and well-nourished.  HENT:  Head: Normocephalic and atraumatic.  Eyes: EOM are normal.  Neck: Normal range of motion.  Cardiovascular: Normal rate and regular rhythm.  Pulmonary/Chest: Effort normal and breath sounds normal. No respiratory distress. He has no  wheezes. He has no rales.  Abdominal: Soft. Bowel sounds are normal. He exhibits no distension. There is no tenderness. There is no rebound.  Musculoskeletal: He exhibits no edema.  Neurological: He is alert and oriented to person, place, and time. Coordination normal.  Skin: Skin is warm and dry.  Psychiatric: He has a normal mood and affect.   Vitals:   02/20/18 0926  BP: 104/80  Pulse: 74  Temp: 97.7 F (36.5 C)  TempSrc: Oral  SpO2: 98%  Weight: 235 lb (106.6 kg)  Height: 5\' 11"  (1.803 m)      Assessment & Plan:

## 2018-02-20 NOTE — Assessment & Plan Note (Signed)
Checking HgA1c today and he is taking metformin 1500 mg daily. On ARB and statin.

## 2018-02-20 NOTE — Assessment & Plan Note (Signed)
Checking thyroid, vitamin levels, CMP, CBC, HgA1c today to evaluate for metabolic source of fatigue. Suspect poor and inadequate sleep is responsible for symptoms. Advised to try melatonin over the counter as a safer sleep alternative that he could feel okay about taking nightly.

## 2018-04-14 ENCOUNTER — Other Ambulatory Visit: Payer: Self-pay | Admitting: Internal Medicine

## 2018-04-14 DIAGNOSIS — E118 Type 2 diabetes mellitus with unspecified complications: Secondary | ICD-10-CM

## 2018-04-14 DIAGNOSIS — I10 Essential (primary) hypertension: Secondary | ICD-10-CM

## 2018-07-01 ENCOUNTER — Ambulatory Visit: Payer: Self-pay

## 2018-07-01 ENCOUNTER — Telehealth: Payer: Self-pay | Admitting: *Deleted

## 2018-07-01 ENCOUNTER — Ambulatory Visit: Payer: Self-pay | Admitting: *Deleted

## 2018-07-01 NOTE — Telephone Encounter (Signed)
He made an appt on Mychart for 07/18/18 at 8:00 with Dr. Scarlette Calico at the [facility].   In the reason for the appt he put feeling lightheaded and "my glucose".  He returned the call where a triage nurse had attempted to call him earlier regarding this.  He is c/o feeling light headed since yesterday.   He feels better today.  He checked his glucose yesterday morning and it was 84 which seems low for me.   He did not check his glucose this morning.   He has eaten breakfast and getting ready to eat lunch now.    See triage notes.   The protocol is to be seen within 2 weeks.   He had gotten an appt via [place] for 07/18/18 at 8:00 with Dr. Ronnald Ramp which was the next available appt he had.   So I kept that appt for him.  I routed a note to Dr. Ronnald Ramp just so he would be aware.    Reason for Disposition . [1] MILD dizziness (e.g., walking normally) AND [2] has been evaluated by physician for this  Answer Assessment - Initial Assessment Questions 1. DESCRIPTION: "Describe your dizziness."     Yesterday and today, maybe my blood sugar.  I'm feeling light headed.   I've not passed out.   I'm light headed today too.   Yesterday I checked my glucose it was 84 which is low for me.  I've not checked it today. 2. LIGHTHEADED: "Do you feel lightheaded?" (e.g., somewhat faint, woozy, weak upon standing)     Yesterday all day I was light headed. 3. VERTIGO: "Do you feel like either you or the room is spinning or tilting?" (i.e. vertigo)     No   My ear bothered me last week. 4. SEVERITY: "How bad is it?"  "Do you feel like you are going to faint?" "Can you stand and walk?"   - MILD - walking normally   - MODERATE - interferes with normal activities (e.g., work, school)    - SEVERE - unable to stand, requires support to walk, feels like passing out now.      I'm not holding onto things.  My wife drove for me yesterday. 5. ONSET:  "When did the dizziness begin?"     Yesterday 6.  AGGRAVATING FACTORS: "Does anything make it worse?" (e.g., standing, change in head position)     Nothing 7. HEART RATE: "Can you tell me your heart rate?" "How many beats in 15 seconds?"  (Note: not all patients can do this)       No.   8. CAUSE: "What do you think is causing the dizziness?"     Maybe my glucose.   I usually check my glucose in the morning before I eat.   I did not check it this morning.     9. RECURRENT SYMPTOM: "Have you had dizziness before?" If so, ask: "When was the last time?" "What happened that time?"     No   10. OTHER SYMPTOMS: "Do you have any other symptoms?" (e.g., fever, chest pain, vomiting, diarrhea, bleeding)       No 11. PREGNANCY: "Is there any chance you are pregnant?" "When was your last menstrual period?"       N/A  Protocols used: DIZZINESS [facility]

## 2018-07-01 NOTE — Telephone Encounter (Signed)
Should pt come in sooner?

## 2018-07-01 NOTE — Telephone Encounter (Signed)
The pt called in so this call has been taken care of.

## 2018-07-02 ENCOUNTER — Encounter: Admitting: Internal Medicine

## 2018-07-02 NOTE — Telephone Encounter (Signed)
Can we get him in this week?

## 2018-07-03 ENCOUNTER — Ambulatory Visit (INDEPENDENT_AMBULATORY_CARE_PROVIDER_SITE_OTHER): Admitting: Internal Medicine

## 2018-07-03 ENCOUNTER — Encounter: Payer: Self-pay | Admitting: Internal Medicine

## 2018-07-03 ENCOUNTER — Other Ambulatory Visit (INDEPENDENT_AMBULATORY_CARE_PROVIDER_SITE_OTHER)

## 2018-07-03 VITALS — BP 132/80 | HR 81 | Temp 97.6°F | Resp 16 | Ht 71.0 in | Wt 229.0 lb

## 2018-07-03 DIAGNOSIS — E785 Hyperlipidemia, unspecified: Secondary | ICD-10-CM

## 2018-07-03 DIAGNOSIS — E559 Vitamin D deficiency, unspecified: Secondary | ICD-10-CM

## 2018-07-03 DIAGNOSIS — N138 Other obstructive and reflux uropathy: Secondary | ICD-10-CM

## 2018-07-03 DIAGNOSIS — I10 Essential (primary) hypertension: Secondary | ICD-10-CM

## 2018-07-03 DIAGNOSIS — N401 Enlarged prostate with lower urinary tract symptoms: Secondary | ICD-10-CM | POA: Diagnosis not present

## 2018-07-03 DIAGNOSIS — E118 Type 2 diabetes mellitus with unspecified complications: Secondary | ICD-10-CM

## 2018-07-03 DIAGNOSIS — R778 Other specified abnormalities of plasma proteins: Secondary | ICD-10-CM

## 2018-07-03 DIAGNOSIS — Z0000 Encounter for general adult medical examination without abnormal findings: Secondary | ICD-10-CM

## 2018-07-03 LAB — CBC WITH DIFFERENTIAL/PLATELET
BASOS ABS: 0 10*3/uL (ref 0.0–0.1)
BASOS PCT: 0.2 % (ref 0.0–3.0)
EOS ABS: 0.1 10*3/uL (ref 0.0–0.7)
Eosinophils Relative: 1.6 % (ref 0.0–5.0)
HEMATOCRIT: 45.3 % (ref 39.0–52.0)
HEMOGLOBIN: 15.2 g/dL (ref 13.0–17.0)
LYMPHS PCT: 36.1 % (ref 12.0–46.0)
Lymphs Abs: 3 10*3/uL (ref 0.7–4.0)
MCHC: 33.5 g/dL (ref 30.0–36.0)
MCV: 92.9 fl (ref 78.0–100.0)
Monocytes Absolute: 0.6 10*3/uL (ref 0.1–1.0)
Monocytes Relative: 7.6 % (ref 3.0–12.0)
Neutro Abs: 4.5 10*3/uL (ref 1.4–7.7)
Neutrophils Relative %: 54.5 % (ref 43.0–77.0)
Platelets: 182 10*3/uL (ref 150.0–400.0)
RBC: 4.88 Mil/uL (ref 4.22–5.81)
RDW: 13.9 % (ref 11.5–15.5)
WBC: 8.2 10*3/uL (ref 4.0–10.5)

## 2018-07-03 LAB — COMPREHENSIVE METABOLIC PANEL
ALBUMIN: 4.5 g/dL (ref 3.5–5.2)
ALT: 22 U/L (ref 0–53)
AST: 20 U/L (ref 0–37)
Alkaline Phosphatase: 72 U/L (ref 39–117)
BUN: 17 mg/dL (ref 6–23)
CHLORIDE: 102 meq/L (ref 96–112)
CO2: 30 mEq/L (ref 19–32)
Calcium: 10 mg/dL (ref 8.4–10.5)
Creatinine, Ser: 1.09 mg/dL (ref 0.40–1.50)
GFR: 87.48 mL/min (ref >60.00)
Glucose, Bld: 76 mg/dL (ref 70–99)
POTASSIUM: 4.6 meq/L (ref 3.5–5.1)
SODIUM: 138 meq/L (ref 135–145)
Total Bilirubin: 0.5 mg/dL (ref 0.2–1.2)
Total Protein: 7.5 g/dL (ref 6.0–8.3)

## 2018-07-03 LAB — LIPID PANEL
Cholesterol: 133 mg/dL (ref 0–200)
HDL: 59 mg/dL (ref >39.00)
LDL CALC: 58 mg/dL (ref 0–99)
NonHDL: 74.2
TRIGLYCERIDES: 82 mg/dL (ref 0.0–149.0)
Total CHOL/HDL Ratio: 2
VLDL: 16.4 mg/dL (ref 0.0–40.0)

## 2018-07-03 LAB — PSA: PSA: 0.59 ng/mL (ref 0.10–4.00)

## 2018-07-03 LAB — TSH: TSH: 1.46 u[IU]/mL (ref 0.35–4.50)

## 2018-07-03 LAB — URINALYSIS, ROUTINE W REFLEX MICROSCOPIC
BILIRUBIN URINE: NEGATIVE
Hgb urine dipstick: NEGATIVE
KETONES UR: NEGATIVE
LEUKOCYTES UA: NEGATIVE
Nitrite: NEGATIVE
RBC / HPF: NONE SEEN (ref 0–?)
SPECIFIC GRAVITY, URINE: 1.01 (ref 1.000–1.030)
Total Protein, Urine: NEGATIVE
URINE GLUCOSE: NEGATIVE
UROBILINOGEN UA: 0.2 (ref 0.0–1.0)
pH: 6.5 (ref 5.0–8.0)

## 2018-07-03 LAB — MICROALBUMIN / CREATININE URINE RATIO
Creatinine,U: 78 mg/dL
Microalb Creat Ratio: 0.9 mg/g (ref 0.0–30.0)

## 2018-07-03 LAB — HEMOGLOBIN A1C: Hgb A1c MFr Bld: 6.7 % — ABNORMAL HIGH (ref 4.6–6.5)

## 2018-07-03 LAB — VITAMIN D 25 HYDROXY (VIT D DEFICIENCY, FRACTURES): VITD: 37.22 ng/mL (ref 30.00–100.00)

## 2018-07-03 NOTE — Patient Instructions (Signed)

## 2018-07-03 NOTE — Progress Notes (Signed)
Subjective:  Patient ID: Brandon Shannon, male    DOB: 20-Jul-1956  Age: 62 y.o. MRN: [id number]  CC: Hypertension; Hyperlipidemia; Diabetes; and Annual Exam   HPI Brandon Shannon presents for a CPX.  He had an episode of dizziness and lightheadedness about 5 days ago.  The symptoms started after he had not eaten for a day.  He checked his blood sugar and it was down to 62.  He has been eating since then and his symptoms have resolved.  He denies any recent episodes of CP, DOE, palpitations, edema, or fatigue.  Outpatient Medications Prior to Visit  Medication Sig Dispense Refill  . ALPRAZolam (XANAX) 0.5 MG tablet Take 0.5 mg by mouth daily as needed for anxiety.    Marland Kitchen aspirin EC 81 MG tablet Take 1 tablet (81 mg total) by mouth daily. 90 tablet 3  . carvedilol (COREG) 3.125 MG tablet Take 1 tablet (3.125 mg total) by mouth 2 (two) times daily with a meal. 180 tablet 1  . cyclobenzaprine (FLEXERIL) 10 MG tablet Take 10 mg by mouth 2 (two) times daily as needed for muscle spasms.    Marland Kitchen EDARBI 40 MG TABS TAKE 1 TABLET DAILY (NEED OFFICE VISIT FOR FURTHER REFILLS) 90 tablet 1  . gabapentin (NEURONTIN) 100 MG capsule Take 100 mg by mouth 2 (two) times daily.    . meloxicam (MOBIC) 7.5 MG tablet Take 7.5 mg by mouth daily.    . metFORMIN (GLUCOPHAGE XR) 750 MG 24 hr tablet Take 2 tablets (1,500 mg total) by mouth daily with breakfast. 180 tablet 1  . rosuvastatin (CRESTOR) 40 MG tablet Take 40 mg by mouth daily.    . tamsulosin (FLOMAX) 0.4 MG CAPS capsule Take 1 capsule (0.4 mg total) by mouth daily after supper. 90 capsule 1   No facility-administered medications prior to visit.     ROS Review of Systems  Constitutional: Negative.  Negative for appetite change, chills, diaphoresis, fatigue and fever.  HENT: Negative.  Negative for sore throat.   Eyes: Negative for visual disturbance.  Respiratory: Negative for cough, chest tightness, shortness of breath and wheezing.   Cardiovascular:  Negative for chest pain, palpitations and leg swelling.  Gastrointestinal: Negative for abdominal pain, constipation, diarrhea, nausea and vomiting.  Endocrine: Negative for polydipsia, polyphagia and polyuria.  Genitourinary: Negative.  Negative for difficulty urinating, dysuria, penile swelling, scrotal swelling and testicular pain.  Musculoskeletal: Negative.  Negative for arthralgias, myalgias and neck pain.  Skin: Negative for color change and rash.  Neurological: Positive for dizziness and light-headedness. Negative for syncope, weakness and numbness.  Hematological: Negative for adenopathy. Does not bruise/bleed easily.  Psychiatric/Behavioral: Negative.     Objective:  BP 132/80 (BP Location: Left Arm, Patient Position: Sitting, Cuff Size: Large)   Pulse 81   Temp 97.6 F (36.4 C) (Oral)   Resp 16   Ht 5\' 11"  (1.803 m)   Wt 229 lb (103.9 kg)   SpO2 97%   BMI 31.94 kg/m   BP Readings from Last 3 Encounters:  07/03/18 132/80  02/20/18 104/80  09/06/17 (!) 148/86    Wt Readings from Last 3 Encounters:  07/03/18 229 lb (103.9 kg)  02/20/18 235 lb (106.6 kg)  09/06/17 237 lb (107.5 kg)    Physical Exam  Constitutional: He is oriented to person, place, and time. No distress.  HENT:  Mouth/Throat: Oropharynx is clear and moist. No oropharyngeal exudate.  Eyes: Conjunctivae are normal. No scleral icterus.  Neck: Normal range  of motion. Neck supple. No JVD present. No thyromegaly present.  Cardiovascular: Normal rate, regular rhythm and normal heart sounds.  No murmur heard. EKG ---  Sinus  Rhythm  WITHIN NORMAL LIMITS  Pulmonary/Chest: Effort normal and breath sounds normal. No respiratory distress. He has no wheezes. He has no rhonchi. He has no rales.  Abdominal: Soft. Bowel sounds are normal. He exhibits no mass. There is no hepatosplenomegaly. There is no tenderness. Hernia confirmed negative in the right inguinal area and confirmed negative in the left inguinal  area.  Genitourinary: Rectum normal, testes normal and penis normal. Rectal exam shows no external hemorrhoid, no internal hemorrhoid, no fissure, no mass, no tenderness, anal tone normal and guaiac negative stool. Prostate is enlarged (2+ smooth symm BPH). Prostate is not tender. Right testis shows no mass, no swelling and no tenderness. Left testis shows no mass, no swelling and no tenderness. Circumcised. No penile erythema or penile tenderness. No discharge found.  Musculoskeletal: Normal range of motion. He exhibits no edema, tenderness or deformity.  Lymphadenopathy:    He has no cervical adenopathy. No inguinal adenopathy noted on the right or left side.  Neurological: He is alert and oriented to person, place, and time.  Skin: Skin is warm and dry. No rash noted. He is not diaphoretic.  Psychiatric: He has a normal mood and affect. His behavior is normal. Judgment and thought content normal.  Vitals reviewed.   Lab Results  Component Value Date   WBC 8.2 07/03/2018   HGB 15.2 07/03/2018   HCT 45.3 07/03/2018   PLT 182.0 07/03/2018   GLUCOSE 76 07/03/2018   CHOL 133 07/03/2018   TRIG 82.0 07/03/2018   HDL 59.00 07/03/2018   LDLCALC 58 07/03/2018   ALT 22 07/03/2018   AST 20 07/03/2018   NA 138 07/03/2018   K 4.6 07/03/2018   CL 102 07/03/2018   CREATININE 1.09 07/03/2018   BUN 17 07/03/2018   CO2 30 07/03/2018   TSH 1.46 07/03/2018   PSA 0.59 07/03/2018   INR 1.14 08/28/2016   HGBA1C 6.7 (H) 07/03/2018   MICROALBUR <0.7 07/03/2018    Dg Chest 2 View  Result Date: 09/07/2017 CLINICAL DATA:  Shortness of breath.  Congestion.  Chills. EXAM: CHEST  2 VIEW COMPARISON:  06/02/2017.  09/07/2016. FINDINGS: Mediastinum and hilar structures normal. Heart size normal. Low lung volumes with stable elevation left hemidiaphragm. Mild lingular atelectasis/infiltrate. Heart size stable. No pulmonary venous congestion. No pleural effusion or pneumothorax. IMPRESSION: 1.  Low lung  volumes.  Mild lingular atelectasis/infiltrate. 2.  Stable elevation left hemidiaphragm Electronically Signed   By: Marcello Moores  Register   On: 09/07/2017 07:34    Assessment & Plan:   Brandon Shannon was seen today for hypertension, hyperlipidemia, diabetes and annual exam.  Diagnoses and all orders for this visit:  Type II diabetes mellitus with manifestations ([place])- His A1c is at 6.7%.  His blood sugars are adequately well controlled.  I think the recent episodes of dizziness and lightheadedness are related to mild hypoglycemia.  I have encouraged him to eat more consistently. -     Hemoglobin A1c; Future -     Comprehensive metabolic panel; Future -     Microalbumin / creatinine urine ratio; Future  Essential hypertension- His blood pressure is well controlled.  Electrolytes and renal function are normal.  His EKG is negative for LVH or ischemia. -     CBC with Differential/Platelet; Future -     TSH; Future -  Urinalysis, Routine w reflex microscopic; Future -     Comprehensive metabolic panel; Future -     EKG 12-Lead  Elevated total protein- I will check an SPEP to see if he is developed lymphoproliferative disease. -     Comprehensive metabolic panel; Future -     Protein electrophoresis, serum; Future  Routine general medical examination at a health care facility- Exam completed, labs reviewed, vaccines reviewed and updated, screening for colon cancer is up-to-date, patient education material was given.  Hyperlipidemia with target LDL less than 100- He has achieved his LDL goal and is doing well on the statin. -     Lipid panel; Future -     TSH; Future -     Comprehensive metabolic panel; Future  BPH with obstruction/lower urinary tract symptoms- His PSA is low which is reassuring that he does not have prostate cancer.  His symptoms are well controlled with tamsulosin. -     PSA; Future  Vitamin D deficiency-vitamin D level is normal now. -     VITAMIN D 25 Hydroxy (Vit-D  Deficiency, Fractures); Future   I am having Brandon Shannon maintain his metFORMIN, rosuvastatin, aspirin EC, tamsulosin, cyclobenzaprine, meloxicam, gabapentin, ALPRAZolam, carvedilol, and EDARBI.  No orders of the defined types were placed in this encounter.    Follow-up: Return in about 4 weeks (around 07/31/2018).  Scarlette Calico, MD

## 2018-07-04 ENCOUNTER — Encounter: Payer: Self-pay | Admitting: Internal Medicine

## 2018-07-04 ENCOUNTER — Ambulatory Visit: Payer: Self-pay

## 2018-07-04 NOTE — Telephone Encounter (Signed)
Pt.'s wife calling to ask if his lab results were back. Instructed her that we would call as soon as the provider reviews the results. Verbalizes understanding.

## 2018-07-04 NOTE — Telephone Encounter (Signed)
Left message for pt. To call back and discuss any dizziness he may be having. Wife mentioned he " was dizzy this morning, but I'm not with him right now, someone else is."

## 2018-07-05 LAB — PROTEIN ELECTROPHORESIS, SERUM
ALBUMIN ELP: 4.2 g/dL (ref 3.8–4.8)
ALPHA 1: 0.3 g/dL (ref 0.2–0.3)
ALPHA 2: 0.7 g/dL (ref 0.5–0.9)
BETA 2: 0.5 g/dL (ref 0.2–0.5)
Beta Globulin: 0.5 g/dL (ref 0.4–0.6)
GAMMA GLOBULIN: 1.2 g/dL (ref 0.8–1.7)
Total Protein: 7.3 g/dL (ref 6.1–8.1)

## 2018-07-18 ENCOUNTER — Ambulatory Visit (INDEPENDENT_AMBULATORY_CARE_PROVIDER_SITE_OTHER): Admitting: Internal Medicine

## 2018-07-18 ENCOUNTER — Encounter: Payer: Self-pay | Admitting: Internal Medicine

## 2018-07-18 VITALS — BP 128/80 | HR 74 | Temp 97.7°F | Ht 71.0 in | Wt 238.0 lb

## 2018-07-18 DIAGNOSIS — K219 Gastro-esophageal reflux disease without esophagitis: Secondary | ICD-10-CM

## 2018-07-18 DIAGNOSIS — K582 Mixed irritable bowel syndrome: Secondary | ICD-10-CM | POA: Diagnosis not present

## 2018-07-18 DIAGNOSIS — Z23 Encounter for immunization: Secondary | ICD-10-CM

## 2018-07-18 MED ORDER — HYOSCYAMINE SULFATE 0.125 MG PO TABS: 0.1250 mg | ORAL_TABLET | Freq: Four times a day (QID) | ORAL | 2 refills | Status: DC | PRN

## 2018-07-18 MED ORDER — OMEPRAZOLE 20 MG PO CPDR: 20.0000 mg | DELAYED_RELEASE_CAPSULE | Freq: Every day | ORAL | 1 refills | Status: DC

## 2018-07-18 NOTE — Patient Instructions (Addendum)
Irritable Bowel Syndrome, Adult Irritable bowel syndrome (IBS) is not one specific disease. It is a group of symptoms that affects the organs responsible for digestion (gastrointestinal or GI tract). To regulate how your GI tract works, your body sends signals back and forth between your intestines and your brain. If you have IBS, there may be a problem with these signals. As a result, your GI tract does not function normally. Your intestines may become more sensitive and overreact to certain things. This is especially true when you eat certain foods or when you are under stress. There are four types of IBS. These may be determined based on the consistency of your stool:  IBS with diarrhea.  IBS with constipation.  Mixed IBS.  Unsubtyped IBS.  It is important to know which type of IBS you have. Some treatments are more likely to be helpful for certain types of IBS. What are the causes? The exact cause of IBS is not known. What increases the risk? You may have a higher risk of IBS if:  You are a woman.  You are younger than 64 years old.  You have a family history of IBS.  You have mental health problems.  You have had bacterial infection of your GI tract.  What are the signs or symptoms? Symptoms of IBS vary from person to person. The main symptom is abdominal pain or discomfort. Additional symptoms usually include one or more of the following:  Diarrhea, constipation, or both.  Abdominal swelling or bloating.  Feeling full or sick after eating a small or regular-size meal.  Frequent gas.  Mucus in the stool.  A feeling of having more stool left after a bowel movement.  Symptoms tend to come and go. They may be associated with stress, psychiatric conditions, or nothing at all. How is this diagnosed? There is no specific test to diagnose IBS. Your health care provider will make a diagnosis based on a physical exam, medical history, and your symptoms. You may have other  tests to rule out other conditions that may be causing your symptoms. These may include:  Blood tests.  X-rays.  CT scan.  Endoscopy and colonoscopy. This is a test in which your GI tract is viewed with a long, thin, flexible tube.  How is this treated? There is no cure for IBS, but treatment can help relieve symptoms. IBS treatment often includes:  Changes to your diet, such as: ? Eating more fiber. ? Avoiding foods that cause symptoms. ? Drinking more water. ? Eating regular, medium-sized portioned meals.  Medicines. These may include: ? Fiber supplements if you have constipation. ? Medicine to control diarrhea (antidiarrheal medicines). ? Medicine to help control muscle spasms in your GI tract (antispasmodic medicines). ? Medicines to help with any mental health issues, such as antidepressants or tranquilizers.  Therapy. ? Talk therapy may help with anxiety, depression, or other mental health issues that can make IBS symptoms worse.  Stress reduction. ? Managing your stress can help keep symptoms under control.  Follow these instructions at home:  Take medicines only as directed by your health care provider.  Eat a healthy diet. ? Avoid foods and drinks with added sugar. ? Include more whole grains, fruits, and vegetables gradually into your diet. This may be especially helpful if you have IBS with constipation. ? Avoid any foods and drinks that make your symptoms worse. These may include dairy products and caffeinated or carbonated drinks. ? Do not eat large meals. ? Drink enough   fluid to keep your urine clear or pale yellow.  Exercise regularly. Ask your health care provider for recommendations of good activities for you.  Keep all follow-up visits as directed by your health care provider. This is important. Contact a health care provider if:  You have constant pain.  You have trouble or pain with swallowing.  You have worsening diarrhea. Get help right away  if:  You have severe and worsening abdominal pain.  You have diarrhea and: ? You have a rash, stiff neck, or severe headache. ? You are irritable, sleepy, or difficult to awaken. ? You are weak, dizzy, or extremely thirsty.  You have bright red blood in your stool or you have black tarry stools.  You have unusual abdominal swelling that is painful.  You vomit continuously.  You vomit blood (hematemesis).  You have both abdominal pain and a fever. This information is not intended to replace advice given to you by your health care provider. Make sure you discuss any questions you have with your health care provider. Document Released: 08/14/2005 Document Revised: 01/14/2016 Document Reviewed: 05/01/2014 Elsevier Interactive Patient Education  2018 [employer]  

## 2018-07-18 NOTE — Progress Notes (Signed)
Subjective:  Patient ID: Brandon Shannon, male    DOB: 05-27-1956  Age: 62 y.o. MRN: [id number]  CC: Gastroesophageal Reflux   HPI Brandon Shannon presents for f/up - He complains of a long-standing history of intermittent heartburn and abdominal bloating.  He also has days where he does not have a bowel movement at all and then some days where he has frequent bowel movements.  He denies abdominal pain, cramping, blood in his stool, loss of appetite, or weight loss.  He is taking 20 mg of Prilosec each day to control the heartburn with good results.  Outpatient Medications Prior to Visit  Medication Sig Dispense Refill  . ALPRAZolam (XANAX) 0.5 MG tablet Take 0.5 mg by mouth daily as needed for anxiety.    Marland Kitchen aspirin EC 81 MG tablet Take 1 tablet (81 mg total) by mouth daily. 90 tablet 3  . carvedilol (COREG) 3.125 MG tablet Take 1 tablet (3.125 mg total) by mouth 2 (two) times daily with a meal. 180 tablet 1  . cyclobenzaprine (FLEXERIL) 10 MG tablet Take 10 mg by mouth 2 (two) times daily as needed for muscle spasms.    Marland Kitchen EDARBI 40 MG TABS TAKE 1 TABLET DAILY (NEED OFFICE VISIT FOR FURTHER REFILLS) 90 tablet 1  . gabapentin (NEURONTIN) 100 MG capsule Take 100 mg by mouth 2 (two) times daily.    . meloxicam (MOBIC) 7.5 MG tablet Take 7.5 mg by mouth daily.    . metFORMIN (GLUCOPHAGE XR) 750 MG 24 hr tablet Take 2 tablets (1,500 mg total) by mouth daily with breakfast. 180 tablet 1  . rosuvastatin (CRESTOR) 40 MG tablet Take 40 mg by mouth daily.    . tamsulosin (FLOMAX) 0.4 MG CAPS capsule Take 1 capsule (0.4 mg total) by mouth daily after supper. 90 capsule 1   No facility-administered medications prior to visit.     ROS Review of Systems  Constitutional: Positive for unexpected weight change (wt gain). Negative for diaphoresis and fatigue.  HENT: Negative.  Negative for trouble swallowing and voice change.   Eyes: Negative for visual disturbance.  Respiratory: Negative for cough and  chest tightness.   Cardiovascular: Negative for chest pain, palpitations and leg swelling.  Gastrointestinal: Positive for constipation and diarrhea. Negative for abdominal distention, abdominal pain, anal bleeding, nausea and vomiting.  Genitourinary: Negative.  Negative for difficulty urinating.  Musculoskeletal: Negative.  Negative for arthralgias.  Skin: Negative.   Neurological: Negative.   Hematological: Negative.  Negative for adenopathy. Does not bruise/bleed easily.  Psychiatric/Behavioral: Negative.     Objective:  BP 128/80 (BP Location: Left Arm, Patient Position: Sitting, Cuff Size: Large)   Pulse 74   Temp 97.7 F (36.5 C) (Oral)   Ht 5\' 11"  (1.803 m)   Wt 238 lb (108 kg)   SpO2 98%   BMI 33.19 kg/m   BP Readings from Last 3 Encounters:  07/18/18 128/80  07/03/18 132/80  02/20/18 104/80    Wt Readings from Last 3 Encounters:  07/18/18 238 lb (108 kg)  07/03/18 229 lb (103.9 kg)  02/20/18 235 lb (106.6 kg)    Physical Exam  Constitutional: He is oriented to person, place, and time. No distress.  HENT:  Mouth/Throat: Oropharynx is clear and moist. No oropharyngeal exudate.  Eyes: Conjunctivae are normal. No scleral icterus.  Neck: Normal range of motion. Neck supple. No JVD present. No thyromegaly present.  Cardiovascular: Normal rate, regular rhythm and normal heart sounds. Exam reveals no gallop.  No murmur heard. Pulmonary/Chest: Effort normal and breath sounds normal. He has no decreased breath sounds. He has no wheezes. He has no rhonchi. He has no rales.  Abdominal: Soft. Bowel sounds are normal. He exhibits no mass. There is no hepatosplenomegaly. There is no tenderness.  Musculoskeletal: Normal range of motion. He exhibits no edema, tenderness or deformity.  Lymphadenopathy:    He has no cervical adenopathy.  Neurological: He is alert and oriented to person, place, and time.  Skin: Skin is warm and dry. No rash noted. He is not diaphoretic.  Vitals  reviewed.   Lab Results  Component Value Date   WBC 8.2 07/03/2018   HGB 15.2 07/03/2018   HCT 45.3 07/03/2018   PLT 182.0 07/03/2018   GLUCOSE 76 07/03/2018   CHOL 133 07/03/2018   TRIG 82.0 07/03/2018   HDL 59.00 07/03/2018   LDLCALC 58 07/03/2018   ALT 22 07/03/2018   AST 20 07/03/2018   NA 138 07/03/2018   K 4.6 07/03/2018   CL 102 07/03/2018   CREATININE 1.09 07/03/2018   BUN 17 07/03/2018   CO2 30 07/03/2018   TSH 1.46 07/03/2018   PSA 0.59 07/03/2018   INR 1.14 08/28/2016   HGBA1C 6.7 (H) 07/03/2018   MICROALBUR <0.7 07/03/2018    Dg Chest 2 View  Result Date: 09/07/2017 CLINICAL DATA:  Shortness of breath.  Congestion.  Chills. EXAM: CHEST  2 VIEW COMPARISON:  06/02/2017.  09/07/2016. FINDINGS: Mediastinum and hilar structures normal. Heart size normal. Low lung volumes with stable elevation left hemidiaphragm. Mild lingular atelectasis/infiltrate. Heart size stable. No pulmonary venous congestion. No pleural effusion or pneumothorax. IMPRESSION: 1.  Low lung volumes.  Mild lingular atelectasis/infiltrate. 2.  Stable elevation left hemidiaphragm Electronically Signed   By: Marcello Moores  Register   On: 09/07/2017 07:34    Assessment & Plan:   Jarad was seen today for gastroesophageal reflux.  Diagnoses and all orders for this visit:  Need for Tdap vaccination -     Tdap vaccine greater than or equal to 7yo IM  Irritable bowel syndrome with both constipation and diarrhea- I have asked him to try hyoscyamine as needed for symptom relief. -     hyoscyamine (LEVSIN) 0.125 MG tablet; Take 1 tablet (0.125 mg total) by mouth every 6 (six) hours as needed.  Gastroesophageal reflux disease without esophagitis- He is getting symptom relief with the current PPI dose.  Will continue. -     omeprazole (PRILOSEC) 20 MG capsule; Take 1 capsule (20 mg total) by mouth daily.   I am having Brandon Shannon start on hyoscyamine and omeprazole. I am also having him maintain his  metFORMIN, rosuvastatin, aspirin EC, tamsulosin, cyclobenzaprine, meloxicam, gabapentin, ALPRAZolam, carvedilol, and EDARBI.  Meds ordered this encounter  Medications  . hyoscyamine (LEVSIN) 0.125 MG tablet    Sig: Take 1 tablet (0.125 mg total) by mouth every 6 (six) hours as needed.    Dispense:  90 tablet    Refill:  2  . omeprazole (PRILOSEC) 20 MG capsule    Sig: Take 1 capsule (20 mg total) by mouth daily.    Dispense:  90 capsule    Refill:  1     Follow-up: Return in about 6 months (around 01/16/2019).  Scarlette Calico, MD

## 2018-10-11 ENCOUNTER — Other Ambulatory Visit: Payer: Self-pay | Admitting: Internal Medicine

## 2018-10-11 DIAGNOSIS — I10 Essential (primary) hypertension: Secondary | ICD-10-CM

## 2018-10-11 DIAGNOSIS — E118 Type 2 diabetes mellitus with unspecified complications: Secondary | ICD-10-CM

## 2018-12-09 LAB — CBC AND DIFFERENTIAL
HCT: 42 (ref 41–53)
Hemoglobin: 14.3 (ref 13.5–17.5)
Platelets: 164 (ref 150–399)
WBC: 6.9

## 2018-12-09 LAB — BASIC METABOLIC PANEL
BUN: 12 (ref 4–21)
Creatinine: 1.1 (ref 0.6–1.3)
Glucose: 155
Potassium: 4.1 (ref 3.4–5.3)
Sodium: 139 (ref 137–147)

## 2018-12-09 LAB — HEPATIC FUNCTION PANEL
ALT: 30 (ref 10–40)
AST: 18 (ref 14–40)
Alkaline Phosphatase: 85 (ref 25–125)
Bilirubin, Total: 0.4

## 2018-12-09 LAB — LIPID PANEL
Cholesterol: 109 (ref 0–200)
HDL: 58 (ref 35–70)
LDL Cholesterol: 38
Triglycerides: 64 (ref 40–160)

## 2018-12-09 LAB — PSA: PSA: 0.467

## 2018-12-09 LAB — HEMOGLOBIN A1C: Hemoglobin A1C: 7.1

## 2018-12-18 ENCOUNTER — Telehealth: Payer: Self-pay | Admitting: Internal Medicine

## 2018-12-18 NOTE — Telephone Encounter (Signed)
Left patient vm to call back to reschedule appt scheduled on 5/21 with Dr. Ronnald Ramp due to provider being out of the office.

## 2018-12-31 ENCOUNTER — Ambulatory Visit (INDEPENDENT_AMBULATORY_CARE_PROVIDER_SITE_OTHER): Admitting: Internal Medicine

## 2018-12-31 ENCOUNTER — Ambulatory Visit: Payer: Self-pay | Admitting: *Deleted

## 2018-12-31 ENCOUNTER — Other Ambulatory Visit: Payer: Self-pay

## 2018-12-31 ENCOUNTER — Encounter: Payer: Self-pay | Admitting: Internal Medicine

## 2018-12-31 VITALS — BP 152/94 | HR 73 | Temp 98.1°F | Resp 16 | Ht 71.0 in | Wt 245.0 lb

## 2018-12-31 DIAGNOSIS — Z1159 Encounter for screening for other viral diseases: Secondary | ICD-10-CM | POA: Insufficient documentation

## 2018-12-31 DIAGNOSIS — E118 Type 2 diabetes mellitus with unspecified complications: Secondary | ICD-10-CM | POA: Diagnosis not present

## 2018-12-31 DIAGNOSIS — I10 Essential (primary) hypertension: Secondary | ICD-10-CM | POA: Diagnosis not present

## 2018-12-31 MED ORDER — ERTUGLIFLOZIN L-PYROGLUTAMICAC 5 MG PO TABS: 1.0000 | ORAL_TABLET | Freq: Every day | ORAL | 0 refills | Status: DC

## 2018-12-31 MED ORDER — CARVEDILOL 12.5 MG PO TABS: 12.5000 mg | ORAL_TABLET | Freq: Two times a day (BID) | ORAL | 1 refills | Status: AC

## 2018-12-31 NOTE — Progress Notes (Signed)
Subjective:  Patient ID: Marjory Lies, male    DOB: November 15, 1955  Age: 63 y.o. MRN: [id number]  CC: Hypertension and Diabetes   HPI Kishon A Kluender presents for f/up - He has been concerned that his blood pressure is not adequately well controlled.  He monitors it frequently and never gets any numbers less than 130/80 and has some systolics as high as 412.  He has been working on his lifestyle modifications walking for an hour every day for the last few weeks.  He denies CP, DOE, palpitations, edema, or fatigue.  He is also concerned that his blood sugars are not well controlled.  He is compliant with the metformin.  He denies polys but unfortunately he is gaining weight.  Outpatient Medications Prior to Visit  Medication Sig Dispense Refill  . ALPRAZolam (XANAX) 0.5 MG tablet Take 0.5 mg by mouth daily as needed for anxiety.    Marland Kitchen aspirin EC 81 MG tablet Take 1 tablet (81 mg total) by mouth daily. 90 tablet 3  . cyclobenzaprine (FLEXERIL) 10 MG tablet Take 10 mg by mouth 2 (two) times daily as needed for muscle spasms.    Marland Kitchen EDARBI 40 MG TABS TAKE 1 TABLET DAILY (NEED OFFICE VISIT FOR FURTHER REFILLS) 90 tablet 1  . gabapentin (NEURONTIN) 100 MG capsule Take 100 mg by mouth 2 (two) times daily.    . hyoscyamine (LEVSIN) 0.125 MG tablet Take 1 tablet (0.125 mg total) by mouth every 6 (six) hours as needed. 90 tablet 2  . metFORMIN (GLUCOPHAGE XR) 750 MG 24 hr tablet Take 2 tablets (1,500 mg total) by mouth daily with breakfast. 180 tablet 1  . omeprazole (PRILOSEC) 20 MG capsule Take 1 capsule (20 mg total) by mouth daily. 90 capsule 1  . rosuvastatin (CRESTOR) 40 MG tablet Take 40 mg by mouth daily.    . tamsulosin (FLOMAX) 0.4 MG CAPS capsule Take 1 capsule (0.4 mg total) by mouth daily after supper. 90 capsule 1  . carvedilol (COREG) 3.125 MG tablet Take 1 tablet (3.125 mg total) by mouth 2 (two) times daily with a meal. 180 tablet 1  . meloxicam (MOBIC) 7.5 MG tablet Take 7.5 mg by mouth  daily.     No facility-administered medications prior to visit.     ROS Review of Systems  Constitutional: Positive for unexpected weight change (wt gain). Negative for appetite change, diaphoresis and fatigue.  HENT: Negative.   Eyes: Negative for visual disturbance.  Respiratory: Negative for cough, chest tightness, shortness of breath and wheezing.   Cardiovascular: Negative for chest pain, palpitations and leg swelling.  Gastrointestinal: Negative for abdominal pain, constipation, diarrhea, nausea and vomiting.  Endocrine: Negative.  Negative for polydipsia, polyphagia and polyuria.  Genitourinary: Negative.  Negative for difficulty urinating, dysuria and hematuria.  Musculoskeletal: Negative.  Negative for arthralgias and myalgias.  Skin: Negative.   Neurological: Negative.  Negative for dizziness, weakness, light-headedness and headaches.  Hematological: Negative for adenopathy. Does not bruise/bleed easily.  Psychiatric/Behavioral: Negative.     Objective:  BP (!) 152/94 (BP Location: Left Arm, Patient Position: Sitting, Cuff Size: Large)   Pulse 73   Temp 98.1 F (36.7 C) (Oral)   Resp 16   Ht 5\' 11"  (1.803 m)   Wt 245 lb (111.1 kg)   SpO2 96%   BMI 34.17 kg/m   BP Readings from Last 3 Encounters:  12/31/18 (!) 152/94  07/18/18 128/80  07/03/18 132/80    Wt Readings from Last 3 Encounters:  12/31/18 245 lb (111.1 kg)  07/18/18 238 lb (108 kg)  07/03/18 229 lb (103.9 kg)    Physical Exam Vitals signs reviewed.  Constitutional:      Appearance: He is obese. He is not ill-appearing or diaphoretic.  HENT:     Nose: Nose normal. No congestion.     Mouth/Throat:     Mouth: Mucous membranes are moist.     Pharynx: Oropharynx is clear. No oropharyngeal exudate or posterior oropharyngeal erythema.  Eyes:     General: No scleral icterus.    Conjunctiva/sclera: Conjunctivae normal.  Neck:     Musculoskeletal: Normal range of motion and neck supple.   Cardiovascular:     Rate and Rhythm: Normal rate and regular rhythm.     Heart sounds: No murmur. No gallop.   Pulmonary:     Effort: Pulmonary effort is normal. No respiratory distress.     Breath sounds: No stridor. No wheezing, rhonchi or rales.  Abdominal:     General: Bowel sounds are normal.     Palpations: There is no mass.     Tenderness: There is no abdominal tenderness.  Musculoskeletal: Normal range of motion.     Right lower leg: No edema.     Left lower leg: No edema.  Lymphadenopathy:     Cervical: No cervical adenopathy.  Skin:    General: Skin is warm.     Coloration: Skin is not pale.  Neurological:     General: No focal deficit present.     Lab Results  Component Value Date   WBC 6.9 12/09/2018   HGB 14.3 12/09/2018   HCT 42 12/09/2018   PLT 164 12/09/2018   GLUCOSE 76 07/03/2018   CHOL 109 12/09/2018   TRIG 64 12/09/2018   HDL 58 12/09/2018   LDLCALC 38 12/09/2018   ALT 30 12/09/2018   AST 18 12/09/2018   NA 139 12/09/2018   K 4.1 12/09/2018   CL 102 07/03/2018   CREATININE 1.1 12/09/2018   BUN 12 12/09/2018   CO2 30 07/03/2018   TSH 1.46 07/03/2018   PSA 0.467 12/09/2018   INR 1.14 08/28/2016   HGBA1C 7.1 12/09/2018   MICROALBUR <0.7 07/03/2018    Dg Chest 2 View  Result Date: 09/07/2017 CLINICAL DATA:  Shortness of breath.  Congestion.  Chills. EXAM: CHEST  2 VIEW COMPARISON:  06/02/2017.  09/07/2016. FINDINGS: Mediastinum and hilar structures normal. Heart size normal. Low lung volumes with stable elevation left hemidiaphragm. Mild lingular atelectasis/infiltrate. Heart size stable. No pulmonary venous congestion. No pleural effusion or pneumothorax. IMPRESSION: 1.  Low lung volumes.  Mild lingular atelectasis/infiltrate. 2.  Stable elevation left hemidiaphragm Electronically Signed   By: Marcello Moores  Register   On: 09/07/2017 07:34    Assessment & Plan:   Laquan was seen today for hypertension and diabetes.  Diagnoses and all orders for  this visit:  Essential hypertension- His blood pressure is not adequately well controlled.  I have asked him to increase the dose of carvedilol.  I also think adding an SGLT2 inhibitor will help lower his blood pressure. -     Basic metabolic panel; Future -     carvedilol (COREG) 12.5 MG tablet; Take 1 tablet (12.5 mg total) by mouth 2 (two) times daily with a meal.  Type II diabetes mellitus with manifestations ([place])- His A1c is at 7.1%.  His blood sugar is not adequately well controlled.  I have asked him to stay on the current dose of metformin  but to add an SGLT2 inhibitor.  Will try Steglatro at 5 mg a day for the next month or 2 and then I anticipate increasing to the higher dose of 15 mg a day.  He was also encouraged to improve his lifestyle modifications. -     Basic metabolic panel; Future -     Hemoglobin A1c; Future -     HM Diabetes Foot Exam -     Ertugliflozin L-PyroglutamicAc (STEGLATRO) 5 MG TABS; Take 1 tablet by mouth daily.  Need for hepatitis C screening test -     Hepatitis C antibody; Future   I have discontinued Torrian A. Milosevic's meloxicam and carvedilol. I am also having him start on carvedilol and Ertugliflozin L-PyroglutamicAc. Additionally, I am having him maintain his metFORMIN, rosuvastatin, aspirin EC, tamsulosin, cyclobenzaprine, gabapentin, ALPRAZolam, hyoscyamine, omeprazole, and Edarbi.  Meds ordered this encounter  Medications  . carvedilol (COREG) 12.5 MG tablet    Sig: Take 1 tablet (12.5 mg total) by mouth 2 (two) times daily with a meal.    Dispense:  180 tablet    Refill:  1  . Ertugliflozin L-PyroglutamicAc (STEGLATRO) 5 MG TABS    Sig: Take 1 tablet by mouth daily.    Dispense:  49 tablet    Refill:  0     Follow-up: Return in about 3 months (around 04/02/2019).  Scarlette Calico, MD

## 2018-12-31 NOTE — Patient Instructions (Signed)

## 2018-12-31 NOTE — Telephone Encounter (Addendum)
Contacted pt regarding his complaints of high blood pressure; [facility];his Coreg was increased on 12/07/2018, per his physician at the [facility], to 12.5 mg twice daily; the pt states that his blood pressure is elevated; he also says that he missed a dose on 12/25/2018; his readings are as follows, and all were taken on his right upper arm with a home digital cuff: 12/27/2018 AM: 158/90     PM: 148/84  12/28/2018 AM: 156/90     PM: 158/88 12/29/2018 AM: 143/84     PM: 158/92 12/30/2018 AM: 151/91     PM: 157/91 12/30/2018 AM: 149/92 Recommendations made per nurse triage protocol; the pt would like to come into the office for a visit, and declines virtual visit; he was originally scheduled for visit 02/10/2019, and he would like to be seen sooner; conference call initiated with Silas [place]; per [place], pt offered and accepted office visit with Dr Scarlette Calico, [facility], 12/31/2018 at 1330; he verbalized understanding; will route to office for notification.  Reason for Disposition . Systolic BP  >= 110 OR Diastolic >= 315  Answer Assessment - Initial Assessment Questions 1. BLOOD PRESSURE: "What is the blood pressure?" "Did you take at least two measurements 5 minutes apart?"     149/92 12/31/2018 and 157/91 2. ONSET: "When did you take your blood pressure?" Am 12/31/2018 and PM 12/30/2018 3. HOW: "How did you obtain the blood pressure?" (e.g., visiting nurse, automatic home BP monitor)     Home cuff right arm 4. HISTORY: "Do you have a history of high blood pressure?"     yes 5. MEDICATIONS: "Are you taking any medications for blood pressure?" "Have you missed any doses recently?"   Yes, missed dose 12/25/2018 . OTHER SYMPTOMS: "Do you have any symptoms?" (e.g., headache, chest pain, blurred vision, difficulty breathing, weakness)    no 7. PREGNANCY: "Is there any chance you are pregnant?" "When was your last menstrual period?"     n/a  Protocols used: HIGH BLOOD PRESSURE-A-AH

## 2019-01-02 ENCOUNTER — Encounter: Payer: Self-pay | Admitting: Internal Medicine

## 2019-01-06 ENCOUNTER — Other Ambulatory Visit: Payer: Self-pay | Admitting: Internal Medicine

## 2019-01-06 DIAGNOSIS — I10 Essential (primary) hypertension: Secondary | ICD-10-CM

## 2019-01-06 MED ORDER — INDAPAMIDE 1.25 MG PO TABS: 1.2500 mg | ORAL_TABLET | Freq: Every day | ORAL | 1 refills | Status: DC

## 2019-01-16 ENCOUNTER — Ambulatory Visit: Admitting: Internal Medicine

## 2019-02-10 ENCOUNTER — Ambulatory Visit: Admitting: Internal Medicine

## 2019-03-06 ENCOUNTER — Telehealth: Payer: Self-pay | Admitting: Internal Medicine

## 2019-03-06 NOTE — Telephone Encounter (Signed)
Pt informed he is due in 2021

## 2019-03-06 NOTE — Telephone Encounter (Signed)
Patient called stating he believes it is time for his colonoscopy. Patient states he had one 5 years ago and would like to find out more information.  Patient call back [phone]

## 2019-04-09 ENCOUNTER — Other Ambulatory Visit: Payer: Self-pay | Admitting: Internal Medicine

## 2019-04-09 DIAGNOSIS — E118 Type 2 diabetes mellitus with unspecified complications: Secondary | ICD-10-CM

## 2019-04-09 DIAGNOSIS — I10 Essential (primary) hypertension: Secondary | ICD-10-CM

## 2019-04-22 ENCOUNTER — Other Ambulatory Visit (INDEPENDENT_AMBULATORY_CARE_PROVIDER_SITE_OTHER): Payer: Medicare Other

## 2019-04-22 ENCOUNTER — Ambulatory Visit (INDEPENDENT_AMBULATORY_CARE_PROVIDER_SITE_OTHER): Payer: Medicare Other | Admitting: Internal Medicine

## 2019-04-22 ENCOUNTER — Encounter: Payer: Self-pay | Admitting: Internal Medicine

## 2019-04-22 ENCOUNTER — Other Ambulatory Visit: Payer: Self-pay

## 2019-04-22 VITALS — BP 144/90 | HR 67 | Temp 98.2°F | Ht 71.0 in | Wt 233.0 lb

## 2019-04-22 DIAGNOSIS — Z23 Encounter for immunization: Secondary | ICD-10-CM | POA: Diagnosis not present

## 2019-04-22 DIAGNOSIS — I10 Essential (primary) hypertension: Secondary | ICD-10-CM

## 2019-04-22 DIAGNOSIS — Z1159 Encounter for screening for other viral diseases: Secondary | ICD-10-CM

## 2019-04-22 DIAGNOSIS — E118 Type 2 diabetes mellitus with unspecified complications: Secondary | ICD-10-CM

## 2019-04-22 LAB — BASIC METABOLIC PANEL
BUN: 13 mg/dL (ref 6–23)
CO2: 26 mEq/L (ref 19–32)
Calcium: 9.2 mg/dL (ref 8.4–10.5)
Chloride: 106 mEq/L (ref 96–112)
Creatinine, Ser: 1.07 mg/dL (ref 0.40–1.50)
GFR: 83.88 mL/min (ref >60.00)
Glucose, Bld: 135 mg/dL — ABNORMAL HIGH (ref 70–99)
Potassium: 4.1 mEq/L (ref 3.5–5.1)
Sodium: 139 mEq/L (ref 135–145)

## 2019-04-22 LAB — HEMOGLOBIN A1C: Hgb A1c MFr Bld: 6.7 % — ABNORMAL HIGH (ref 4.6–6.5)

## 2019-04-22 MED ORDER — STEGLATRO 15 MG PO TABS: 15.0000 mg | ORAL_TABLET | Freq: Every day | ORAL | 0 refills | Status: DC

## 2019-04-22 NOTE — Patient Instructions (Signed)

## 2019-04-22 NOTE — Progress Notes (Signed)
Subjective:  Patient ID: Brandon Shannon, male    DOB: 01/05/1956  Age: 63 y.o. MRN: [id number]  CC: Hypertension and Diabetes   HPI Brandon Shannon presents for f/up - He is not taking the thiazide diuretic.  He has decided to try to control his blood pressure with lifestyle modifications.  He is active and denies any recent episodes of CP or DOE.  Outpatient Medications Prior to Visit  Medication Sig Dispense Refill  . ALPRAZolam (XANAX) 0.5 MG tablet Take 0.5 mg by mouth daily as needed for anxiety.    . carvedilol (COREG) 12.5 MG tablet Take 1 tablet (12.5 mg total) by mouth 2 (two) times daily with a meal. 180 tablet 1  . cyclobenzaprine (FLEXERIL) 10 MG tablet Take 10 mg by mouth 2 (two) times daily as needed for muscle spasms.    Marland Kitchen EDARBI 40 MG TABS TAKE 1 TABLET DAILY (NEED OFFICE VISIT FOR FURTHER REFILLS) 90 tablet 0  . metFORMIN (GLUCOPHAGE XR) 750 MG 24 hr tablet Take 2 tablets (1,500 mg total) by mouth daily with breakfast. 180 tablet 1  . omeprazole (PRILOSEC) 20 MG capsule Take 1 capsule (20 mg total) by mouth daily. 90 capsule 1  . rosuvastatin (CRESTOR) 40 MG tablet Take 40 mg by mouth daily.    . tamsulosin (FLOMAX) 0.4 MG CAPS capsule Take 1 capsule (0.4 mg total) by mouth daily after supper. 90 capsule 1  . Ertugliflozin L-PyroglutamicAc (STEGLATRO) 5 MG TABS Take 1 tablet by mouth daily. 49 tablet 0  . hyoscyamine (LEVSIN) 0.125 MG tablet Take 1 tablet (0.125 mg total) by mouth every 6 (six) hours as needed. 90 tablet 2  . gabapentin (NEURONTIN) 100 MG capsule Take 100 mg by mouth 2 (two) times daily.    . indapamide (LOZOL) 1.25 MG tablet Take 1 tablet (1.25 mg total) by mouth daily. (Patient not taking: Reported on 04/22/2019) 90 tablet 1  . aspirin EC 81 MG tablet Take 1 tablet (81 mg total) by mouth daily. (Patient not taking: Reported on 04/22/2019) 90 tablet 3   No facility-administered medications prior to visit.     ROS Review of Systems  Constitutional:  Negative for diaphoresis, fatigue and unexpected weight change.  HENT: Negative.   Eyes: Negative.   Respiratory: Negative for cough, chest tightness, shortness of breath and wheezing.   Cardiovascular: Negative for chest pain, palpitations and leg swelling.  Gastrointestinal: Negative for abdominal pain, constipation, diarrhea, nausea and vomiting.  Endocrine: Negative.  Negative for polyuria.  Genitourinary: Negative.  Negative for difficulty urinating.  Musculoskeletal: Negative.  Negative for arthralgias and myalgias.  Skin: Negative.  Negative for color change and pallor.  Neurological: Negative.  Negative for dizziness, weakness, light-headedness and headaches.  Hematological: Negative for adenopathy. Does not bruise/bleed easily.  Psychiatric/Behavioral: Negative.     Objective:  BP (!) 144/90 (BP Location: Left Arm, Patient Position: Sitting, Cuff Size: Large)   Pulse 67   Temp 98.2 F (36.8 C) (Oral)   Ht 5\' 11"  (1.803 m)   Wt 233 lb (105.7 kg)   SpO2 97%   BMI 32.50 kg/m   BP Readings from Last 3 Encounters:  04/22/19 (!) 144/90  12/31/18 (!) 152/94  07/18/18 128/80    Wt Readings from Last 3 Encounters:  04/22/19 233 lb (105.7 kg)  12/31/18 245 lb (111.1 kg)  07/18/18 238 lb (108 kg)    Physical Exam Vitals signs reviewed.  Constitutional:      Appearance: He is obese.  He is not ill-appearing or diaphoretic.  HENT:     Nose: Nose normal.     Mouth/Throat:     Mouth: Mucous membranes are moist.  Eyes:     General: No scleral icterus.    Conjunctiva/sclera: Conjunctivae normal.  Neck:     Musculoskeletal: Normal range of motion. No neck rigidity or muscular tenderness.  Cardiovascular:     Rate and Rhythm: Normal rate and regular rhythm.     Heart sounds: No murmur.  Pulmonary:     Effort: Pulmonary effort is normal.     Breath sounds: No stridor. No wheezing, rhonchi or rales.  Abdominal:     General: Abdomen is protuberant. Bowel sounds are normal.  There is no distension.     Palpations: There is no hepatomegaly, splenomegaly or mass.     Tenderness: There is no abdominal tenderness. There is no guarding.  Musculoskeletal: Normal range of motion.     Right lower leg: No edema.     Left lower leg: No edema.  Lymphadenopathy:     Cervical: No cervical adenopathy.  Skin:    General: Skin is warm and dry.     Coloration: Skin is not pale.  Neurological:     Mental Status: He is alert and oriented to person, place, and time. Mental status is at baseline.     Lab Results  Component Value Date   WBC 6.9 12/09/2018   HGB 14.3 12/09/2018   HCT 42 12/09/2018   PLT 164 12/09/2018   GLUCOSE 135 (H) 04/22/2019   CHOL 109 12/09/2018   TRIG 64 12/09/2018   HDL 58 12/09/2018   LDLCALC 38 12/09/2018   ALT 30 12/09/2018   AST 18 12/09/2018   NA 139 04/22/2019   K 4.1 04/22/2019   CL 106 04/22/2019   CREATININE 1.07 04/22/2019   BUN 13 04/22/2019   CO2 26 04/22/2019   TSH 1.46 07/03/2018   PSA 0.467 12/09/2018   INR 1.14 08/28/2016   HGBA1C 6.7 (H) 04/22/2019   MICROALBUR <0.7 07/03/2018    Dg Chest 2 View  Result Date: 09/07/2017 CLINICAL DATA:  Shortness of breath.  Congestion.  Chills. EXAM: CHEST  2 VIEW COMPARISON:  06/02/2017.  09/07/2016. FINDINGS: Mediastinum and hilar structures normal. Heart size normal. Low lung volumes with stable elevation left hemidiaphragm. Mild lingular atelectasis/infiltrate. Heart size stable. No pulmonary venous congestion. No pleural effusion or pneumothorax. IMPRESSION: 1.  Low lung volumes.  Mild lingular atelectasis/infiltrate. 2.  Stable elevation left hemidiaphragm Electronically Signed   By: Marcello Moores  Register   On: 09/07/2017 07:34    Assessment & Plan:   Brandon Shannon was seen today for hypertension and diabetes.  Diagnoses and all orders for this visit:  Essential hypertension- He has not achieved his blood pressure goal of less than 130/80.  I praised him for his lifestyle modifications  and asked him to start taking the thiazide diuretic as prescribed. -     Basic metabolic panel; Future  Type II diabetes mellitus with manifestations ([place])- His A1c is at 6.7%.  He has achieved good glycemic control. -     Hemoglobin A1c; Future -     Basic metabolic panel; Future -     Ertugliflozin L-PyroglutamicAc (STEGLATRO) 15 MG TABS; Take 15 mg by mouth daily before breakfast.  Need for hepatitis C screening test -     Hepatitis C antibody; Future  Need for influenza vaccination -     Flu Vaccine QUAD High Dose([place])  Need for pneumococcal vaccination -     Pneumococcal conjugate vaccine 13-valent   I have discontinued Brandon Shannon's aspirin EC, hyoscyamine, and Ertugliflozin L-PyroglutamicAc. I am also having him start on Steglatro. Additionally, I am having him maintain his metFORMIN, rosuvastatin, tamsulosin, cyclobenzaprine, gabapentin, ALPRAZolam, omeprazole, carvedilol, indapamide, and Edarbi.  Meds ordered this encounter  Medications  . Ertugliflozin L-PyroglutamicAc (STEGLATRO) 15 MG TABS    Sig: Take 15 mg by mouth daily before breakfast.    Dispense:  49 tablet    Refill:  0     Follow-up: Return in about 3 months (around 07/23/2019).  Scarlette Calico, MD

## 2019-04-23 ENCOUNTER — Encounter: Payer: Self-pay | Admitting: Internal Medicine

## 2019-04-23 LAB — HEPATITIS C ANTIBODY
Hepatitis C Ab: NONREACTIVE
SIGNAL TO CUT-OFF: 0.06 (ref <1.00)

## 2019-06-16 ENCOUNTER — Telehealth: Payer: Self-pay | Admitting: Internal Medicine

## 2019-06-16 DIAGNOSIS — E118 Type 2 diabetes mellitus with unspecified complications: Secondary | ICD-10-CM

## 2019-06-16 NOTE — Telephone Encounter (Signed)
Pt request refill   steglatro 15 mg  Pt states Dr Ronnald Ramp gave him samples and now he would like a 63 day sent to   [place], [place] [phone] (Phone) [phone] (Fax)

## 2019-06-17 MED ORDER — STEGLATRO 15 MG PO TABS: 15.0000 mg | ORAL_TABLET | Freq: Every day | ORAL | 1 refills | Status: DC

## 2019-06-17 NOTE — Telephone Encounter (Signed)
Reviewed chart pt is up-to-date [facility].../lmb  

## 2019-06-20 NOTE — Telephone Encounter (Signed)
Paperwork for PA started and faxed.

## 2019-07-06 ENCOUNTER — Other Ambulatory Visit: Payer: Self-pay

## 2019-07-06 ENCOUNTER — Emergency Department (HOSPITAL_COMMUNITY): Payer: Medicare Other

## 2019-07-06 ENCOUNTER — Emergency Department (HOSPITAL_COMMUNITY)
Admission: EM | Admit: 2019-07-06 | Discharge: 2019-07-06 | Disposition: A | Discharge status: Home / Self Care | Payer: Medicare Other | Attending: Emergency Medicine | Admitting: Emergency Medicine

## 2019-07-06 ENCOUNTER — Encounter (HOSPITAL_COMMUNITY): Payer: Self-pay

## 2019-07-06 DIAGNOSIS — Z79899 Other long term (current) drug therapy: Secondary | ICD-10-CM | POA: Insufficient documentation

## 2019-07-06 DIAGNOSIS — R55 Syncope and collapse: Secondary | ICD-10-CM | POA: Diagnosis present

## 2019-07-06 DIAGNOSIS — I10 Essential (primary) hypertension: Secondary | ICD-10-CM | POA: Diagnosis not present

## 2019-07-06 DIAGNOSIS — Z7984 Long term (current) use of oral hypoglycemic drugs: Secondary | ICD-10-CM | POA: Diagnosis not present

## 2019-07-06 DIAGNOSIS — E119 Type 2 diabetes mellitus without complications: Secondary | ICD-10-CM | POA: Insufficient documentation

## 2019-07-06 DIAGNOSIS — N179 Acute kidney failure, unspecified: Secondary | ICD-10-CM | POA: Diagnosis not present

## 2019-07-06 DIAGNOSIS — R112 Nausea with vomiting, unspecified: Secondary | ICD-10-CM

## 2019-07-06 LAB — CBC WITH DIFFERENTIAL/PLATELET
Abs Immature Granulocytes: 0.02 10*3/uL (ref 0.00–0.07)
Basophils Absolute: 0.1 10*3/uL (ref 0.0–0.1)
Basophils Relative: 1 %
Eosinophils Absolute: 0.1 10*3/uL (ref 0.0–0.5)
Eosinophils Relative: 2 %
HCT: 49.1 % (ref 39.0–52.0)
Hemoglobin: 16.1 g/dL (ref 13.0–17.0)
Immature Granulocytes: 0 %
Lymphocytes Relative: 33 %
Lymphs Abs: 2.9 10*3/uL (ref 0.7–4.0)
MCH: 30.4 pg (ref 26.0–34.0)
MCHC: 32.8 g/dL (ref 30.0–36.0)
MCV: 92.8 fL (ref 80.0–100.0)
Monocytes Absolute: 0.7 10*3/uL (ref 0.1–1.0)
Monocytes Relative: 8 %
Neutro Abs: 5 10*3/uL (ref 1.7–7.7)
Neutrophils Relative %: 56 %
Platelets: 166 10*3/uL (ref 150–400)
RBC: 5.29 MIL/uL (ref 4.22–5.81)
RDW: 12.5 % (ref 11.5–15.5)
WBC: 8.8 10*3/uL (ref 4.0–10.5)
nRBC: 0 % (ref 0.0–0.2)

## 2019-07-06 LAB — URINALYSIS, ROUTINE W REFLEX MICROSCOPIC
Bacteria, UA: NONE SEEN
Bilirubin Urine: NEGATIVE
Glucose, UA: >=500 mg/dL — AB
Hgb urine dipstick: NEGATIVE
Ketones, ur: NEGATIVE mg/dL
Leukocytes,Ua: NEGATIVE
Nitrite: NEGATIVE
Protein, ur: NEGATIVE mg/dL
Specific Gravity, Urine: 1.015 (ref 1.005–1.030)
pH: 6 (ref 5.0–8.0)

## 2019-07-06 LAB — COMPREHENSIVE METABOLIC PANEL
ALT: 22 U/L (ref 0–44)
AST: 31 U/L (ref 15–41)
Albumin: 4 g/dL (ref 3.5–5.0)
Alkaline Phosphatase: 77 U/L (ref 38–126)
Anion gap: 12 (ref 5–15)
BUN: 21 mg/dL (ref 8–23)
CO2: 25 mmol/L (ref 22–32)
Calcium: 9.6 mg/dL (ref 8.9–10.3)
Chloride: 99 mmol/L (ref 98–111)
Creatinine, Ser: 1.33 mg/dL — ABNORMAL HIGH (ref 0.61–1.24)
GFR calc Af Amer: >60 mL/min (ref >60)
GFR calc non Af Amer: 56 mL/min — ABNORMAL LOW (ref >60)
Glucose, Bld: 94 mg/dL (ref 70–99)
Potassium: 3.7 mmol/L (ref 3.5–5.1)
Sodium: 136 mmol/L (ref 135–145)
Total Bilirubin: 0.8 mg/dL (ref 0.3–1.2)
Total Protein: 7.4 g/dL (ref 6.5–8.1)

## 2019-07-06 LAB — LIPASE, BLOOD: Lipase: 30 U/L (ref 11–51)

## 2019-07-06 LAB — TROPONIN I (HIGH SENSITIVITY)
Troponin I (High Sensitivity): 4 ng/L (ref <18)
Troponin I (High Sensitivity): 3 ng/L (ref <18)

## 2019-07-06 LAB — CBG MONITORING, ED: Glucose-Capillary: 89 mg/dL (ref 70–99)

## 2019-07-06 MED ORDER — SODIUM CHLORIDE 0.9 % IV BOLUS (SEPSIS)
1000.0000 mL | Freq: Once | INTRAVENOUS | Status: AC
  Administered 2019-07-06: 1000 mL via INTRAVENOUS

## 2019-07-06 MED ORDER — ONDANSETRON HCL 4 MG/2ML IJ SOLN
4.0000 mg | Freq: Once | INTRAMUSCULAR | Status: AC
  Administered 2019-07-06: 04:00:00 4 mg via INTRAVENOUS
  Filled 2019-07-06: qty 2

## 2019-07-06 MED ORDER — ONDANSETRON 4 MG PO TBDP: 4.0000 mg | ORAL_TABLET | Freq: Four times a day (QID) | ORAL | 0 refills | Status: DC | PRN

## 2019-07-06 NOTE — ED Notes (Signed)
Pt verbalized understanding of discharge instructions and follow up care. Time provided to questions and answers. Pt ambulatory to lobby with steady gait. IV removed and bleeding controlled.

## 2019-07-06 NOTE — ED Notes (Signed)
Patient transported to CT 

## 2019-07-06 NOTE — Discharge Instructions (Signed)
Your kidney function was slightly elevated today at 1.33 which may be secondary to dehydration.  You have received 2 L of IV fluids here in the emergency department.  Your labs should be rechecked by your primary care doctor in 1 to 2 weeks.  I recommend this time you avoid aspirin, ibuprofen, Aleve as these can worsen your renal function.  It is safe to take Tylenol.  I recommend you increase your water intake at home.  Your other labs, urine, head CT, EKG today were normal.

## 2019-07-06 NOTE — ED Triage Notes (Signed)
Patient states this evening he started to sweat. When he stood, he felt light-headed and laid down. Pt states he also threw up. Pt states he has experienced this before and was told he was dehydrated.

## 2019-07-06 NOTE — ED Provider Notes (Signed)
TIME SEEN: 3:38 AM  CHIEF COMPLAINT: Syncope  HPI: Patient is a 63 year old male with history of hypertension, hyperlipidemia, diabetes, previous syncopal events that related to vasovagal reaction who presents to the emergency department with a syncopal event that occurred tonight just prior to arrival. Patient states that he was feeling fine earlier today and tonight he suddenly became nauseated and felt like he needed to vomit and have a bowel movement. States he started to sweat and got up to go to the bathroom. Began vomiting and while vomiting started feeling lightheaded. States he passed out on the floor. His wife states that he hit his head and she would like imaging of his brain. Patient is not sure if he is on blood thinners. States he thinks he is on a blood thinner that starts with an 'a' but is not sure what it is for. He denies any numbness, tingling, focal weakness or current headache. No neck or back pain. Denies any preceding chest pain or shortness of breath. Not having a chest tightness or discomfort currently. No seizure-like activity witnessed by patient's wife. No tongue biting or incontinence. States when this is happened before he has been dehydrated. No vomiting or diarrhea prior to this episode.  ROS: See HPI Constitutional: no fever  Eyes: no drainage  ENT: no runny nose   Cardiovascular:  no chest pain  Resp: no SOB  GI: no vomiting GU: no dysuria Integumentary: no rash  Allergy: no hives  Musculoskeletal: no leg swelling  Neurological: no slurred speech ROS otherwise negative  PAST MEDICAL HISTORY/PAST SURGICAL HISTORY:  Past Medical History:  Diagnosis Date  . AKI (acute kidney injury) ([place])   . Arthritis    "hands, knees" (08/28/2016)  . CAP (community acquired pneumonia) 08/28/2016  . GERD (gastroesophageal reflux disease)   . Hyperlipidemia   . Hypertension   . Pneumonia ~ 2015  . Seasonal allergies   . Syncope   . Syncope and collapse 08/28/2016  .  Type II diabetes mellitus (HCC)     MEDICATIONS:  Prior to Admission medications   Medication Sig Start Date End Date Taking? Authorizing Provider  ALPRAZolam Duanne Moron) 0.5 MG tablet Take 0.5 mg by mouth daily as needed for anxiety.    [provider]  carvedilol (COREG) 12.5 MG tablet Take 1 tablet (12.5 mg total) by mouth 2 (two) times daily with a meal. 12/31/18   Janith Lima, MD  cyclobenzaprine (FLEXERIL) 10 MG tablet Take 10 mg by mouth 2 (two) times daily as needed for muscle spasms.    [provider]  EDARBI 40 MG TABS TAKE 1 TABLET DAILY (NEED OFFICE VISIT FOR FURTHER REFILLS) 04/09/19   Janith Lima, MD  Ertugliflozin L-PyroglutamicAc (STEGLATRO) 15 MG TABS Take 15 mg by mouth daily before breakfast. 06/17/19   Janith Lima, MD  gabapentin (NEURONTIN) 100 MG capsule Take 100 mg by mouth 2 (two) times daily.    [provider]  indapamide (LOZOL) 1.25 MG tablet Take 1 tablet (1.25 mg total) by mouth daily. Patient not taking: Reported on 04/22/2019 01/06/19   Janith Lima, MD  metFORMIN (GLUCOPHAGE XR) 750 MG 24 hr tablet Take 2 tablets (1,500 mg total) by mouth daily with breakfast. 09/16/15   Janith Lima, MD  omeprazole (PRILOSEC) 20 MG capsule Take 1 capsule (20 mg total) by mouth daily. 07/18/18   Janith Lima, MD  rosuvastatin (CRESTOR) 40 MG tablet Take 40 mg by mouth daily.    [provider]  tamsulosin (FLOMAX) 0.4 MG CAPS capsule Take 1 capsule (0.4 mg total) by mouth daily after supper. 09/07/16   Janith Lima, MD    ALLERGIES:  Allergies  Allergen Reactions  . Atorvastatin Nausea And Vomiting  . Pravastatin Other (See Comments)    Muscle pain    SOCIAL HISTORY:  Social History   Tobacco Use  . Smoking status: Never Smoker  . Smokeless tobacco: Never Used  Substance Use Topics  . Alcohol use: Yes    Alcohol/week: 4.0 standard drinks    Types: 4 Glasses of wine per week    FAMILY HISTORY: Family History   Problem Relation Age of Onset  . Diabetes Mother   . Heart disease Mother   . Hypertension Mother   . Arthritis Mother   . Hypertension Sister   . Hypertension Sister   . Cancer Neg Hx   . Alcohol abuse Neg Hx   . Drug abuse Neg Hx   . Early death Neg Hx   . Kidney disease Neg Hx   . Stroke Neg Hx   . Colon cancer Neg Hx     EXAM: BP 111/64 (BP Location: Right Arm)   Pulse 64   Resp 16   Ht 5\' 11"  (1.803 m)   Wt 97.5 kg   SpO2 100%   BMI 29.99 kg/m  CONSTITUTIONAL: Alert and oriented and responds appropriately to questions. Well-appearing; well-nourished; GCS 15 HEAD: Normocephalic; atraumatic EYES: Conjunctivae clear, PERRL, EOMI ENT: normal nose; no rhinorrhea; moist mucous membranes; pharynx without lesions noted; no dental injury; no septal hematoma NECK: Supple, no meningismus, no LAD; no midline spinal tenderness, step-off or deformity; trachea midline CARD: RRR; S1 and S2 appreciated; no murmurs, no clicks, no rubs, no gallops RESP: Normal chest excursion without splinting or tachypnea; breath sounds clear and equal bilaterally; no wheezes, no rhonchi, no rales; no hypoxia or respiratory distress CHEST:  chest wall stable, no crepitus or ecchymosis or deformity, nontender to palpation; no flail chest ABD/GI: Normal bowel sounds; non-distended; soft, non-tender, no rebound, no guarding; no ecchymosis or other lesions noted PELVIS:  stable, nontender to palpation BACK:  The back appears normal and is non-tender to palpation, there is no CVA tenderness; no midline spinal tenderness, step-off or deformity EXT: Normal ROM in all joints; non-tender to palpation; no edema; normal capillary refill; no cyanosis, no bony tenderness or bony deformity of patient's extremities, no joint effusion, compartments are soft, extremities are warm and well-perfused, no ecchymosis SKIN: Normal color for age and race; warm NEURO: Moves all extremities equally, sensation to light touch intact  diffusely, cranial nerves II through XII intact PSYCH: The patient's mood and manner are appropriate. Grooming and personal hygiene are appropriate.  MEDICAL DECISION MAKING: Patient here with likely vasovagal syncope. EKG shows no ischemic abnormality, arrhythmia or interval change. Abdominal exam is benign. Will treat symptoms with IV fluids, Zofran. Blood glucose 89. Will obtain labs, urine. Given he did have diaphoresis, lightheadedness and vomiting, will check troponin but low suspicion for ACS. He had no chest pain or shortness of breath. Doubt PE, dissection. Wife requesting CT imaging of his head as she states he hit his head and states she noticed a bump to the back of his head. No signs of trauma on exam but I feel CT is reasonable given patient thinks he may be on anticoagulation. Currently neurologically intact.  ED PROGRESS: Patient's creatinine mildly elevated at 1.33.  He has received 2 L of fluids.  Otherwise labs, urine and 2 troponins are unremarkable today.  EKG shows no arrhythmia, ischemic abnormality.  CT head is unremarkable.  I feel he is safe to be discharged home.  Recommend increase fluid intake, having his PCP recheck his creatinine 1 to 2 weeks.  Recommended avoiding NSAIDs.  Will discharge with prescription of Zofran.  Discussed return precautions with patient and wife.  They are comfortable with this plan.  At this time, I do not feel there is any life-threatening condition present. I have reviewed, interpreted and discussed all results (EKG, imaging, lab, urine as appropriate) and exam findings with patient/family. I have reviewed nursing notes and appropriate previous records.  I feel the patient is safe to be discharged home without further emergent workup and can continue workup as an outpatient as needed. Discussed usual and customary return precautions. Patient/family verbalize understanding and are comfortable with this plan.  Outpatient follow-up has been provided as  needed. All questions have been answered.     EKG Interpretation  Date/Time:  Sunday July 06 2019 03:31:45 EST Ventricular Rate:  77 PR Interval:    QRS Duration: 98 QT Interval:  392 QTC Calculation: 444 R Axis:   72 Text Interpretation: Sinus rhythm Abnormal R-wave progression, early transition Minimal ST elevation, anterior leads No significant change since last tracing Confirmed by Pryor Curia [id number]) on 07/06/2019 3:38:28 AM        Marjory Lies was evaluated in Emergency Department on 07/06/2019 for the symptoms described in the history of present illness. He was evaluated in the context of the global COVID-19 pandemic, which necessitated consideration that the patient might be at risk for infection with the SARS-CoV-2 virus that causes COVID-19. Institutional protocols and algorithms that pertain to the evaluation of patients at risk for COVID-19 are in a state of rapid change based on information released by regulatory bodies including the CDC and federal and state organizations. These policies and algorithms were followed during the patient's care in the ED.     Dioselina Brumbaugh, Delice Bison, DO 07/06/19 [phone]

## 2019-07-08 ENCOUNTER — Other Ambulatory Visit: Payer: Self-pay | Admitting: Internal Medicine

## 2019-07-08 DIAGNOSIS — I10 Essential (primary) hypertension: Secondary | ICD-10-CM

## 2019-07-08 DIAGNOSIS — E118 Type 2 diabetes mellitus with unspecified complications: Secondary | ICD-10-CM

## 2019-07-23 ENCOUNTER — Ambulatory Visit (INDEPENDENT_AMBULATORY_CARE_PROVIDER_SITE_OTHER): Payer: Medicare Other | Admitting: Internal Medicine

## 2019-07-23 ENCOUNTER — Encounter: Payer: Self-pay | Admitting: Internal Medicine

## 2019-07-23 ENCOUNTER — Other Ambulatory Visit: Payer: Self-pay

## 2019-07-23 VITALS — BP 130/80 | HR 75 | Temp 98.0°F | Resp 16 | Ht 71.0 in | Wt 219.0 lb

## 2019-07-23 DIAGNOSIS — E118 Type 2 diabetes mellitus with unspecified complications: Secondary | ICD-10-CM | POA: Diagnosis not present

## 2019-07-23 DIAGNOSIS — I10 Essential (primary) hypertension: Secondary | ICD-10-CM | POA: Diagnosis not present

## 2019-07-23 DIAGNOSIS — E1169 Type 2 diabetes mellitus with other specified complication: Secondary | ICD-10-CM | POA: Diagnosis not present

## 2019-07-23 DIAGNOSIS — N521 Erectile dysfunction due to diseases classified elsewhere: Secondary | ICD-10-CM

## 2019-07-23 LAB — POCT GLYCOSYLATED HEMOGLOBIN (HGB A1C): Hemoglobin A1C: 6.4 % — AB (ref 4.0–5.6)

## 2019-07-23 LAB — POCT UA - MICROALBUMIN
Albumin/Creatinine Ratio, Urine, POC: <30
Creatinine, POC: 50 mg/dL
Microalbumin Ur, POC: 10 mg/L

## 2019-07-23 MED ORDER — LOSARTAN POTASSIUM-HCTZ 50-12.5 MG PO TABS: 1.0000 | ORAL_TABLET | Freq: Every day | ORAL | 1 refills | Status: DC

## 2019-07-23 MED ORDER — SILDENAFIL CITRATE 20 MG PO TABS: 80.0000 mg | ORAL_TABLET | Freq: Every day | ORAL | 11 refills | Status: DC | PRN

## 2019-07-23 NOTE — Patient Instructions (Signed)
Type 2 Diabetes Mellitus, Diagnosis, Adult Type 2 diabetes (type 2 diabetes mellitus) is a long-term (chronic) disease. In type 2 diabetes, one or both of these problems may be present:  The pancreas does not make enough of a hormone called insulin.  Cells in the body do not respond properly to insulin that the body makes (insulin resistance). Normally, insulin allows blood sugar (glucose) to enter cells in the body. The cells use glucose for energy. Insulin resistance or lack of insulin causes excess glucose to build up in the blood instead of going into cells. As a result, high blood glucose (hyperglycemia) develops. What increases the risk? The following factors may make you more likely to develop type 2 diabetes:  Having a family member with type 2 diabetes.  Being overweight or obese.  Having an inactive (sedentary) lifestyle.  Having been diagnosed with insulin resistance.  Having a history of prediabetes, gestational diabetes, or polycystic ovary syndrome (PCOS).  Being of American-Indian, African-American, Hispanic/Latino, or Asian/Pacific Islander descent. What are the signs or symptoms? In the early stage of this condition, you may not have symptoms. Symptoms develop slowly and may include:  Increased thirst (polydipsia).  Increased hunger(polyphagia).  Increased urination (polyuria).  Increased urination during the night (nocturia).  Unexplained weight loss.  Frequent infections that keep coming back (recurring).  Fatigue.  Weakness.  Vision changes, such as blurry vision.  Cuts or bruises that are slow to heal.  Tingling or numbness in the hands or feet.  Dark patches on the skin (acanthosis nigricans). How is this diagnosed? This condition is diagnosed based on your symptoms, your medical history, a physical exam, and your blood glucose level. Your blood glucose may be checked with one or more of the following blood tests:  A fasting blood glucose (FBG)  test. You will not be allowed to eat (you will fast) for 8 hours or longer before a blood sample is taken.  A random blood glucose test. This test checks blood glucose at any time of day regardless of when you ate.  An A1c (hemoglobin A1c) blood test. This test provides information about blood glucose control over the previous 2-3 months.  An oral glucose tolerance test (OGTT). This test measures your blood glucose at two times: ? After fasting. This is your baseline blood glucose level. ? Two hours after drinking a beverage that contains glucose. You may be diagnosed with type 2 diabetes if:  Your FBG level is 126 mg/dL (7.0 mmol/L) or higher.  Your random blood glucose level is 200 mg/dL (11.1 mmol/L) or higher.  Your A1c level is 6.5% or higher.  Your OGTT result is higher than 200 mg/dL (11.1 mmol/L). These blood tests may be repeated to confirm your diagnosis. How is this treated? Your treatment may be managed by a specialist called an endocrinologist. Type 2 diabetes may be treated by following instructions from your health care provider about:  Making diet and lifestyle changes. This may include: ? Following an individualized nutrition plan that is developed by a diet and nutrition specialist (registered dietitian). ? Exercising regularly. ? Finding ways to manage stress.  Checking your blood glucose level as often as told.  Taking diabetes medicines or insulin daily. This helps to keep your blood glucose levels in the healthy range. ? If you use insulin, you may need to adjust the dosage depending on how physically active you are and what foods you eat. Your health care provider will tell you how to adjust your dosage.    Taking medicines to help prevent complications from diabetes, such as: ? Aspirin. ? Medicine to lower cholesterol. ? Medicine to control blood pressure. Your health care provider will set individualized treatment goals for you. Your goals will be based on  your age, other medical conditions you have, and how you respond to diabetes treatment. Generally, the goal of treatment is to maintain the following blood glucose levels:  Before meals (preprandial): 80-130 mg/dL (4.4-7.2 mmol/L).  After meals (postprandial): below 180 mg/dL (10 mmol/L).  A1c level: less than 7%. Follow these instructions at home: Questions to ask your health care provider  Consider asking the following questions: ? Do I need to meet with a diabetes educator? ? Where can I find a support group for people with diabetes? ? What equipment will I need to manage my diabetes at home? ? What diabetes medicines do I need, and when should I take them? ? How often do I need to check my blood glucose? ? What number can I call if I have questions? ? When is my next appointment? General instructions  Take over-the-counter and prescription medicines only as told by your health care provider.  Keep all follow-up visits as told by your health care provider. This is important.  For more information about diabetes, visit: ? American Diabetes Association (ADA): www.diabetes.org ? American Association of Diabetes Educators (AADE): www.diabeteseducator.org Contact a health care provider if:  Your blood glucose is at or above 240 mg/dL (13.3 mmol/L) for 2 days in a row.  You have been sick or have had a fever for 2 days or longer, and you are not getting better.  You have any of the following problems for more than 6 hours: ? You cannot eat or drink. ? You have nausea and vomiting. ? You have diarrhea. Get help right away if:  Your blood glucose is lower than 54 mg/dL (3.0 mmol/L).  You become confused or you have trouble thinking clearly.  You have difficulty breathing.  You have moderate or large ketone levels in your urine. Summary  Type 2 diabetes (type 2 diabetes mellitus) is a long-term (chronic) disease. In type 2 diabetes, the pancreas does not make enough of a  hormone called insulin, or cells in the body do not respond properly to insulin that the body makes (insulin resistance).  This condition is treated by making diet and lifestyle changes and taking diabetes medicines or insulin.  Your health care provider will set individualized treatment goals for you. Your goals will be based on your age, other medical conditions you have, and how you respond to diabetes treatment.  Keep all follow-up visits as told by your health care provider. This is important. This information is not intended to replace advice given to you by your health care provider. Make sure you discuss any questions you have with your health care provider. Document Released: 08/14/2005 Document Revised: 10/12/2017 Document Reviewed: 09/17/2015 Elsevier Patient Education  2020 Elsevier Inc.  

## 2019-07-23 NOTE — Progress Notes (Signed)
Subjective:  Patient ID: Brandon Shannon, male    DOB: [birth year]  Age: 63 y.o. MRN: [id number]  CC: Hypertension and Diabetes  This visit occurred during the SARS-CoV-2 public health emergency.  Safety protocols were in place, including screening questions prior to the visit, additional usage of staff PPE, and extensive cleaning of exam room while observing appropriate contact time as indicated for disinfecting solutions.    HPI Patricia A Peduzzi presents for f/up - He wants to change his antihypertensives.  He was experiencing some dizziness and lightheadedness so he cut the dose of Edarbi and indapamide in half and has had no further episodes of dizziness or lightheadedness but now he tells me the [place] will not cover Edarbi and indapamide and he needs to change to a combination of losartan and HCTZ.  Outpatient Medications Prior to Visit  Medication Sig Dispense Refill   ALPRAZolam (XANAX) 0.5 MG tablet Take 0.5 mg by mouth daily as needed for anxiety.     carvedilol (COREG) 12.5 MG tablet Take 1 tablet (12.5 mg total) by mouth 2 (two) times daily with a meal. 180 tablet 1   cyclobenzaprine (FLEXERIL) 10 MG tablet Take 10 mg by mouth 2 (two) times daily as needed for muscle spasms.     Ertugliflozin L-PyroglutamicAc (STEGLATRO) 15 MG TABS Take 15 mg by mouth daily before breakfast. 90 tablet 1   gabapentin (NEURONTIN) 100 MG capsule Take 100 mg by mouth 2 (two) times daily.     metFORMIN (GLUCOPHAGE XR) 750 MG 24 hr tablet Take 2 tablets (1,500 mg total) by mouth daily with breakfast. 180 tablet 1   omeprazole (PRILOSEC) 20 MG capsule Take 1 capsule (20 mg total) by mouth daily. 90 capsule 1   rosuvastatin (CRESTOR) 40 MG tablet Take 40 mg by mouth daily.     tamsulosin (FLOMAX) 0.4 MG CAPS capsule Take 1 capsule (0.4 mg total) by mouth daily after supper. 90 capsule 1   EDARBI 40 MG TABS TAKE 1 TABLET DAILY (NEED OFFICE VISIT FOR FURTHER REFILLS) 90 tablet 0   indapamide (LOZOL)  1.25 MG tablet Take 1 tablet (1.25 mg total) by mouth daily. (Patient not taking: Reported on 04/22/2019) 90 tablet 1   ondansetron (ZOFRAN ODT) 4 MG disintegrating tablet Take 1 tablet (4 mg total) by mouth every 6 (six) hours as needed. 20 tablet 0   No facility-administered medications prior to visit.     ROS Review of Systems  Constitutional: Negative for diaphoresis, fatigue and unexpected weight change.  HENT: Negative.   Eyes: Negative for visual disturbance.  Respiratory: Negative for cough, chest tightness, shortness of breath and wheezing.   Cardiovascular: Negative for chest pain, palpitations and leg swelling.  Gastrointestinal: Negative for abdominal pain, diarrhea, nausea and vomiting.  Endocrine: Negative.   Genitourinary: Negative.  Negative for difficulty urinating.       +ED  Musculoskeletal: Negative.  Negative for arthralgias and myalgias.  Skin: Negative.  Negative for pallor.  Neurological: Negative for dizziness, weakness and light-headedness.  Hematological: Negative for adenopathy. Does not bruise/bleed easily.  Psychiatric/Behavioral: Negative.     Objective:  BP 130/80 (BP Location: Left Arm, Patient Position: Sitting, Cuff Size: Large)    Pulse 75    Temp 98 F (36.7 C) (Oral)    Resp 16    Ht 5\' 11"  (1.803 m)    Wt 219 lb (99.3 kg)    SpO2 98%    BMI 30.54 kg/m   BP Readings from Last  3 Encounters:  07/23/19 130/80  07/06/19 120/74  04/22/19 (!) 144/90    Wt Readings from Last 3 Encounters:  07/23/19 219 lb (99.3 kg)  07/06/19 215 lb (97.5 kg)  04/22/19 233 lb (105.7 kg)    Physical Exam Constitutional:      Appearance: Normal appearance.  HENT:     Nose: Nose normal.  Eyes:     General: No scleral icterus.    Conjunctiva/sclera: Conjunctivae normal.  Neck:     Musculoskeletal: No muscular tenderness.  Cardiovascular:     Rate and Rhythm: Normal rate and regular rhythm.     Heart sounds: No murmur.  Pulmonary:     Effort: Pulmonary  effort is normal.     Breath sounds: No stridor. No wheezing, rhonchi or rales.  Abdominal:     General: Abdomen is protuberant. Bowel sounds are normal. There is no distension.     Palpations: Abdomen is soft. There is no hepatomegaly or splenomegaly.     Tenderness: There is no abdominal tenderness.  Musculoskeletal: Normal range of motion.     Right lower leg: No edema.     Left lower leg: No edema.  Lymphadenopathy:     Cervical: No cervical adenopathy.  Skin:    General: Skin is warm and dry.  Neurological:     General: No focal deficit present.     Mental Status: He is alert.  Psychiatric:        Mood and Affect: Mood normal.        Behavior: Behavior normal.     Lab Results  Component Value Date   WBC 8.8 07/06/2019   HGB 16.1 07/06/2019   HCT 49.1 07/06/2019   PLT 166 07/06/2019   GLUCOSE 94 07/06/2019   CHOL 109 12/09/2018   TRIG 64 12/09/2018   HDL 58 12/09/2018   LDLCALC 38 12/09/2018   ALT 22 07/06/2019   AST 31 07/06/2019   NA 136 07/06/2019   K 3.7 07/06/2019   CL 99 07/06/2019   CREATININE 1.33 (H) 07/06/2019   BUN 21 07/06/2019   CO2 25 07/06/2019   TSH 1.46 07/03/2018   PSA 0.467 12/09/2018   INR 1.14 08/28/2016   HGBA1C 6.4 (A) 07/23/2019   MICROALBUR 10 07/23/2019    Ct Head Wo Contrast  Result Date: 07/06/2019 CLINICAL DATA:  Syncopal episode in bathroom with head trauma. Patient possibly on blood thinners. EXAM: CT HEAD WITHOUT CONTRAST TECHNIQUE: Contiguous axial images were obtained from the base of the skull through the vertex without intravenous contrast. COMPARISON:  06/02/2017 FINDINGS: Brain: No evidence of acute infarction, hemorrhage, hydrocephalus, extra-axial collection or mass lesion/mass effect. Vascular: No hyperdense vessel or unexpected calcification. Skull: Normal. Negative for fracture or focal lesion. Sinuses/Orbits: No acute finding. Other: None. IMPRESSION: Normal head CT. Electronically Signed   By: Marin Olp M.D.   On:  07/06/2019 05:24    Assessment & Plan:   Lamier was seen today for hypertension and diabetes.  Diagnoses and all orders for this visit:  Essential hypertension- His blood pressure is adequately well controlled.  Will change to losartan and hydrochlorothiazide at his request. -     losartan-hydrochlorothiazide (HYZAAR) 50-12.5 MG tablet; Take 1 tablet by mouth daily.  Type II diabetes mellitus with manifestations ([place]) - His blood sugar is adequately well controlled. -     losartan-hydrochlorothiazide (HYZAAR) 50-12.5 MG tablet; Take 1 tablet by mouth daily. -     POCT HgB A1C -  POCT UA - Microalbumin  Erectile dysfunction associated with type 2 diabetes mellitus (HCC) -     sildenafil (REVATIO) 20 MG tablet; Take 4 tablets (80 mg total) by mouth daily as needed.   I have discontinued Lenny A. Unterreiner's indapamide, ondansetron, and Edarbi. I am also having him start on losartan-hydrochlorothiazide and sildenafil. Additionally, I am having him maintain his metFORMIN, rosuvastatin, tamsulosin, cyclobenzaprine, gabapentin, ALPRAZolam, omeprazole, carvedilol, and Steglatro.  Meds ordered this encounter  Medications   losartan-hydrochlorothiazide (HYZAAR) 50-12.5 MG tablet    Sig: Take 1 tablet by mouth daily.    Dispense:  90 tablet    Refill:  1   sildenafil (REVATIO) 20 MG tablet    Sig: Take 4 tablets (80 mg total) by mouth daily as needed.    Dispense:  60 tablet    Refill:  11     Follow-up: Return in about 4 months (around 11/20/2019).  Scarlette Calico, MD

## 2019-09-24 ENCOUNTER — Encounter: Payer: Self-pay | Admitting: Internal Medicine

## 2019-10-05 ENCOUNTER — Ambulatory Visit: Payer: Medicare Other | Attending: Internal Medicine

## 2019-10-05 DIAGNOSIS — Z23 Encounter for immunization: Secondary | ICD-10-CM

## 2019-10-05 NOTE — Progress Notes (Signed)
   Covid-19 Vaccination Clinic  Name:  Brandon Shannon    MRN: [id number] DOB: 12-26-1955  10/05/2019  [name] was observed post Covid-19 immunization for 15 minutes without incidence. He was provided with Vaccine Information Sheet and instruction to access the V-Safe system.   [name] was instructed to call 911 with any severe reactions post vaccine: Marland Kitchen Difficulty breathing  . Swelling of your face and throat  . A fast heartbeat  . A bad rash all over your body  . Dizziness and weakness    Immunizations Administered    Name Date Dose VIS Date Route   Pfizer COVID-19 Vaccine 10/05/2019  3:23 PM 0.3 mL 08/08/2019 Intramuscular   Manufacturer: [place]   Lot: [id number]   [place]: [id number]

## 2019-10-26 ENCOUNTER — Ambulatory Visit: Payer: Medicare Other

## 2019-10-29 ENCOUNTER — Ambulatory Visit: Payer: Medicare Other | Attending: Internal Medicine

## 2019-10-29 ENCOUNTER — Ambulatory Visit: Payer: Medicare Other

## 2019-10-29 DIAGNOSIS — Z23 Encounter for immunization: Secondary | ICD-10-CM | POA: Insufficient documentation

## 2019-10-29 NOTE — Progress Notes (Signed)
   Covid-19 Vaccination Clinic  Name:  Brandon Shannon    MRN: [id number] DOB: May 31, 1956  10/29/2019  [name] was observed post Covid-19 immunization for 15 minutes without incident. He was provided with Vaccine Information Sheet and instruction to access the V-Safe system.   [name] was instructed to call 911 with any severe reactions post vaccine: Marland Kitchen Difficulty breathing  . Swelling of face and throat  . A fast heartbeat  . A bad rash all over body  . Dizziness and weakness   Immunizations Administered    Name Date Dose VIS Date Route   Pfizer COVID-19 Vaccine 10/29/2019  8:22 AM 0.3 mL 08/08/2019 Intramuscular   Manufacturer: [place]   Lot: [id number]   [place]: [id number]

## 2019-12-26 ENCOUNTER — Other Ambulatory Visit: Payer: Self-pay

## 2019-12-26 ENCOUNTER — Encounter: Payer: Self-pay | Admitting: Family

## 2019-12-26 ENCOUNTER — Ambulatory Visit (INDEPENDENT_AMBULATORY_CARE_PROVIDER_SITE_OTHER): Payer: Medicare Other | Admitting: Family

## 2019-12-26 VITALS — BP 130/70 | HR 73 | Temp 98.3°F | Ht 71.0 in | Wt 228.3 lb

## 2019-12-26 DIAGNOSIS — R5383 Other fatigue: Secondary | ICD-10-CM | POA: Diagnosis not present

## 2019-12-26 DIAGNOSIS — R200 Anesthesia of skin: Secondary | ICD-10-CM

## 2019-12-26 DIAGNOSIS — E118 Type 2 diabetes mellitus with unspecified complications: Secondary | ICD-10-CM | POA: Diagnosis not present

## 2019-12-26 DIAGNOSIS — E559 Vitamin D deficiency, unspecified: Secondary | ICD-10-CM

## 2019-12-26 DIAGNOSIS — R202 Paresthesia of skin: Secondary | ICD-10-CM | POA: Diagnosis not present

## 2019-12-26 LAB — CBC WITH DIFFERENTIAL/PLATELET
Basophils Absolute: 0.1 10*3/uL (ref 0.0–0.1)
Basophils Relative: 0.9 % (ref 0.0–3.0)
Eosinophils Absolute: 0.2 10*3/uL (ref 0.0–0.7)
Eosinophils Relative: 2.3 % (ref 0.0–5.0)
HCT: 42.7 % (ref 39.0–52.0)
Hemoglobin: 14.5 g/dL (ref 13.0–17.0)
Lymphocytes Relative: 41 % (ref 12.0–46.0)
Lymphs Abs: 2.7 10*3/uL (ref 0.7–4.0)
MCHC: 33.9 g/dL (ref 30.0–36.0)
MCV: 93 fl (ref 78.0–100.0)
Monocytes Absolute: 0.6 10*3/uL (ref 0.1–1.0)
Monocytes Relative: 9.1 % (ref 3.0–12.0)
Neutro Abs: 3.1 10*3/uL (ref 1.4–7.7)
Neutrophils Relative %: 46.7 % (ref 43.0–77.0)
Platelets: 165 10*3/uL (ref 150.0–400.0)
RBC: 4.59 Mil/uL (ref 4.22–5.81)
RDW: 13.8 % (ref 11.5–15.5)
WBC: 6.7 10*3/uL (ref 4.0–10.5)

## 2019-12-26 LAB — COMPREHENSIVE METABOLIC PANEL
ALT: 21 U/L (ref 0–53)
AST: 21 U/L (ref 0–37)
Albumin: 4.2 g/dL (ref 3.5–5.2)
Alkaline Phosphatase: 60 U/L (ref 39–117)
BUN: 15 mg/dL (ref 6–23)
CO2: 30 mEq/L (ref 19–32)
Calcium: 9.5 mg/dL (ref 8.4–10.5)
Chloride: 106 mEq/L (ref 96–112)
Creatinine, Ser: 1.02 mg/dL (ref 0.40–1.50)
GFR: 88.45 mL/min (ref >60.00)
Glucose, Bld: 118 mg/dL — ABNORMAL HIGH (ref 70–99)
Potassium: 3.7 mEq/L (ref 3.5–5.1)
Sodium: 138 mEq/L (ref 135–145)
Total Bilirubin: 0.4 mg/dL (ref 0.2–1.2)
Total Protein: 7.1 g/dL (ref 6.0–8.3)

## 2019-12-26 LAB — VITAMIN B12: Vitamin B-12: 429 pg/mL (ref 211–911)

## 2019-12-26 LAB — TSH: TSH: 1.16 u[IU]/mL (ref 0.35–4.50)

## 2019-12-26 LAB — VITAMIN D 25 HYDROXY (VIT D DEFICIENCY, FRACTURES): VITD: 32.08 ng/mL (ref 30.00–100.00)

## 2019-12-26 LAB — HEMOGLOBIN A1C: Hgb A1c MFr Bld: 6.9 % — ABNORMAL HIGH (ref 4.6–6.5)

## 2019-12-26 LAB — MAGNESIUM: Magnesium: 2.1 mg/dL (ref 1.5–2.5)

## 2019-12-26 NOTE — Progress Notes (Signed)
Brandon Shannon is a 64 y.o. male with the following history as recorded in EpicCare:  Patient Active Problem List   Diagnosis Date Noted  . Irritable bowel syndrome with both constipation and diarrhea 07/18/2018  . Gastroesophageal reflux disease without esophagitis 07/18/2018  . Routine general medical examination at a health care facility 09/08/2016  . BPH with obstruction/lower urinary tract symptoms 09/07/2016  . Screening for colon cancer 09/17/2014  . GAD (generalized anxiety disorder) 03/04/2014  . Vitamin D deficiency 04/02/2013  . Type II diabetes mellitus with manifestations ([place]) 09/18/2007  . Hyperlipidemia with target LDL less than 100 04/01/2007  . Obesity 04/01/2007  . Essential hypertension 04/01/2007    Current Outpatient Medications  Medication Sig Dispense Refill  . ALPRAZolam (XANAX) 0.5 MG tablet Take 0.5 mg by mouth daily as needed for anxiety.    . carvedilol (COREG) 12.5 MG tablet Take 1 tablet (12.5 mg total) by mouth 2 (two) times daily with a meal. 180 tablet 1  . cyclobenzaprine (FLEXERIL) 10 MG tablet Take 10 mg by mouth 2 (two) times daily as needed for muscle spasms.    Marland Kitchen gabapentin (NEURONTIN) 100 MG capsule Take 100 mg by mouth 2 (two) times daily.    Marland Kitchen losartan-hydrochlorothiazide (HYZAAR) 50-12.5 MG tablet Take 1 tablet by mouth daily. 90 tablet 1  . metFORMIN (GLUCOPHAGE XR) 750 MG 24 hr tablet Take 2 tablets (1,500 mg total) by mouth daily with breakfast. 180 tablet 1  . omeprazole (PRILOSEC) 20 MG capsule Take 1 capsule (20 mg total) by mouth daily. 90 capsule 1  . rosuvastatin (CRESTOR) 40 MG tablet Take 40 mg by mouth daily.    . sildenafil (REVATIO) 20 MG tablet Take 4 tablets (80 mg total) by mouth daily as needed. 60 tablet 11  . tamsulosin (FLOMAX) 0.4 MG CAPS capsule Take 1 capsule (0.4 mg total) by mouth daily after supper. 90 capsule 1  . magnesium oxide (MAG-OX) 400 MG tablet magnesium oxide 400 mg (241.3 mg magnesium) tablet  TK 1 T PO D      No current facility-administered medications for this visit.    Allergies: Atorvastatin and Pravastatin  Past Medical History:  Diagnosis Date  . AKI (acute kidney injury) ([place])   . Arthritis    "hands, knees" (08/28/2016)  . CAP (community acquired pneumonia) 08/28/2016  . GERD (gastroesophageal reflux disease)   . Hyperlipidemia   . Hypertension   . Pneumonia ~ 2015  . Seasonal allergies   . Syncope   . Syncope and collapse 08/28/2016  . Type II diabetes mellitus ([place])     Past Surgical History:  Procedure Laterality Date  . BUNIONECTOMY WITH HAMMERTOE RECONSTRUCTION Bilateral   . COLONOSCOPY  10-27-2004   normal    Family History  Problem Relation Age of Onset  . Diabetes Mother   . Heart disease Mother   . Hypertension Mother   . Arthritis Mother   . Hypertension Sister   . Hypertension Sister   . Cancer Neg Hx   . Alcohol abuse Neg Hx   . Drug abuse Neg Hx   . Early death Neg Hx   . Kidney disease Neg Hx   . Stroke Neg Hx   . Colon cancer Neg Hx     Social History   Tobacco Use  . Smoking status: Never Smoker  . Smokeless tobacco: Never Used  Substance Use Topics  . Alcohol use: Yes    Alcohol/week: 4.0 standard drinks    Types: 4  Glasses of wine per week    Subjective:  Patient complaining of increased fatigue/ dizziness- has felt off balance in the past few days; has been trying to exercise more regularly recently; does have seasonal allergies;  Unfortunately, there appears to be a lot of confusion regarding patient's medications; he also goes to the [place] for some of his care and those records are not available for review today; there is a magnesium listed on his medication list and he is not sure why this is on his list either.  History of Type 2 Diabetes- is concerned that his blood sugar is elevated; patient stopped the Steglatro- did not consult with Dr. Ronnald Ramp or [facility] provider; has been taking 3 Metformin daily as opposed to the 2 prescribed;  Denies any  chest pain, shortness of breath, blurred vision or headache.    Objective:  Vitals:   12/26/19 1130  BP: 130/70  Pulse: 73  Temp: 98.3 F (36.8 C)  TempSrc: Oral  SpO2: 95%  Weight: 228 lb 4.8 oz (103.6 kg)  Height: _0  (1.803 m)    General: Well developed, well nourished, in no acute distress  Skin : Warm and dry.  Head: Normocephalic and atraumatic  Eyes: Sclera and conjunctiva clear; pupils round and reactive to light; extraocular movements intact  Ears: External normal; canals clear; tympanic membranes normal  Oropharynx: Pink, supple. No suspicious lesions  Neck: Supple without thyromegaly, adenopathy  Lungs: Respirations unlabored; clear to auscultation bilaterally without wheeze, rales, rhonchi  CVS exam: normal rate and regular rhythm.  Neurologic: Alert and oriented; speech intact; face symmetrical; moves all extremities well; CNII-XII intact without focal deficit   Assessment:  1. Type II diabetes mellitus with manifestations (HCC)   2. Other fatigue   3. Numbness and tingling   4. Vitamin D deficiency     Plan: Will update labs today as patient has not been taking medications as prescribed; follow-up to be determined based on the labs; he may need see his PCP sooner than the end of May and needs to bring his medication bottles with him when he comes; Could also consider head CT if symptoms persist;   This visit occurred during the SARS-CoV-2 public health emergency.  Safety protocols were in place, including screening questions prior to the visit, additional usage of staff PPE, and extensive cleaning of exam room while observing appropriate contact time as indicated for disinfecting solutions.     No follow-ups on file.  Orders Placed This Encounter  Procedures  . CBC with Differential/Platelet  . Comp Met (CMET)  . TSH  . B12  . Magnesium  . HgB A1c  . Vitamin D (25 hydroxy)    Requested Prescriptions    No prescriptions requested or ordered in  this encounter

## 2020-01-20 ENCOUNTER — Ambulatory Visit: Payer: Medicare Other | Admitting: Internal Medicine

## 2021-02-11 LAB — HM DIABETES EYE EXAM

## 2021-03-29 ENCOUNTER — Other Ambulatory Visit: Payer: Self-pay

## 2021-03-29 ENCOUNTER — Ambulatory Visit (INDEPENDENT_AMBULATORY_CARE_PROVIDER_SITE_OTHER): Payer: Medicare Other | Admitting: Internal Medicine

## 2021-03-29 ENCOUNTER — Other Ambulatory Visit (INDEPENDENT_AMBULATORY_CARE_PROVIDER_SITE_OTHER): Payer: Medicare Other

## 2021-03-29 ENCOUNTER — Encounter: Payer: Self-pay | Admitting: Internal Medicine

## 2021-03-29 VITALS — BP 126/82 | HR 63 | Temp 97.9°F | Ht 71.0 in | Wt 220.0 lb

## 2021-03-29 DIAGNOSIS — E785 Hyperlipidemia, unspecified: Secondary | ICD-10-CM | POA: Diagnosis not present

## 2021-03-29 DIAGNOSIS — I10 Essential (primary) hypertension: Secondary | ICD-10-CM | POA: Diagnosis not present

## 2021-03-29 DIAGNOSIS — N401 Enlarged prostate with lower urinary tract symptoms: Secondary | ICD-10-CM | POA: Diagnosis not present

## 2021-03-29 DIAGNOSIS — E559 Vitamin D deficiency, unspecified: Secondary | ICD-10-CM | POA: Diagnosis not present

## 2021-03-29 DIAGNOSIS — N138 Other obstructive and reflux uropathy: Secondary | ICD-10-CM

## 2021-03-29 DIAGNOSIS — E118 Type 2 diabetes mellitus with unspecified complications: Secondary | ICD-10-CM | POA: Diagnosis not present

## 2021-03-29 LAB — BASIC METABOLIC PANEL
BUN: 14 mg/dL (ref 6–23)
CO2: 29 mEq/L (ref 19–32)
Calcium: 9.4 mg/dL (ref 8.4–10.5)
Chloride: 103 mEq/L (ref 96–112)
Creatinine, Ser: 1.09 mg/dL (ref 0.40–1.50)
GFR: 70.42 mL/min (ref >60.00)
Glucose, Bld: 128 mg/dL — ABNORMAL HIGH (ref 70–99)
Potassium: 4.3 mEq/L (ref 3.5–5.1)
Sodium: 140 mEq/L (ref 135–145)

## 2021-03-29 LAB — URINALYSIS, ROUTINE W REFLEX MICROSCOPIC
Bilirubin Urine: NEGATIVE
Hgb urine dipstick: NEGATIVE
Ketones, ur: NEGATIVE
Leukocytes,Ua: NEGATIVE
Nitrite: NEGATIVE
RBC / HPF: NONE SEEN (ref 0–?)
Specific Gravity, Urine: 1.015 (ref 1.000–1.030)
Total Protein, Urine: NEGATIVE
Urine Glucose: NEGATIVE
Urobilinogen, UA: 0.2 (ref 0.0–1.0)
pH: 7 (ref 5.0–8.0)

## 2021-03-29 LAB — HEPATIC FUNCTION PANEL
ALT: 17 U/L (ref 0–53)
AST: 20 U/L (ref 0–37)
Albumin: 4.1 g/dL (ref 3.5–5.2)
Alkaline Phosphatase: 65 U/L (ref 39–117)
Bilirubin, Direct: 0.1 mg/dL (ref 0.0–0.3)
Total Bilirubin: 0.6 mg/dL (ref 0.2–1.2)
Total Protein: 7 g/dL (ref 6.0–8.3)

## 2021-03-29 LAB — CBC WITH DIFFERENTIAL/PLATELET
Basophils Absolute: 0 10*3/uL (ref 0.0–0.1)
Basophils Relative: 0.7 % (ref 0.0–3.0)
Eosinophils Absolute: 0.1 10*3/uL (ref 0.0–0.7)
Eosinophils Relative: 2 % (ref 0.0–5.0)
HCT: 42.2 % (ref 39.0–52.0)
Hemoglobin: 14.1 g/dL (ref 13.0–17.0)
Lymphocytes Relative: 38.7 % (ref 12.0–46.0)
Lymphs Abs: 2.4 10*3/uL (ref 0.7–4.0)
MCHC: 33.4 g/dL (ref 30.0–36.0)
MCV: 93.7 fl (ref 78.0–100.0)
Monocytes Absolute: 0.5 10*3/uL (ref 0.1–1.0)
Monocytes Relative: 8.5 % (ref 3.0–12.0)
Neutro Abs: 3.1 10*3/uL (ref 1.4–7.7)
Neutrophils Relative %: 50.1 % (ref 43.0–77.0)
Platelets: 164 10*3/uL (ref 150.0–400.0)
RBC: 4.5 Mil/uL (ref 4.22–5.81)
RDW: 13.9 % (ref 11.5–15.5)
WBC: 6.1 10*3/uL (ref 4.0–10.5)

## 2021-03-29 LAB — VITAMIN D 25 HYDROXY (VIT D DEFICIENCY, FRACTURES): VITD: 27.27 ng/mL — ABNORMAL LOW (ref 30.00–100.00)

## 2021-03-29 LAB — LIPID PANEL
Cholesterol: 110 mg/dL (ref 0–200)
HDL: 51.1 mg/dL (ref >39.00)
LDL Cholesterol: 49 mg/dL (ref 0–99)
NonHDL: 59.22
Total CHOL/HDL Ratio: 2
Triglycerides: 53 mg/dL (ref 0.0–149.0)
VLDL: 10.6 mg/dL (ref 0.0–40.0)

## 2021-03-29 LAB — MICROALBUMIN / CREATININE URINE RATIO
Creatinine,U: 74.7 mg/dL
Microalb Creat Ratio: 0.9 mg/g (ref 0.0–30.0)
Microalb, Ur: <0.7 mg/dL (ref 0.0–1.9)

## 2021-03-29 LAB — PSA: PSA: 0.37 ng/mL (ref 0.10–4.00)

## 2021-03-29 LAB — TSH: TSH: 1.66 u[IU]/mL (ref 0.35–5.50)

## 2021-03-29 LAB — HEMOGLOBIN A1C: Hgb A1c MFr Bld: 6.7 % — ABNORMAL HIGH (ref 4.6–6.5)

## 2021-03-29 NOTE — Patient Instructions (Signed)

## 2021-03-29 NOTE — Progress Notes (Signed)
Subjective:  Patient ID: Brandon Shannon, male    DOB: [birth year]  Age: 65 y.o. MRN: [id number]  CC: Hyperlipidemia and Diabetes  This visit occurred during the SARS-CoV-2 public health emergency.  Safety protocols were in place, including screening questions prior to the visit, additional usage of staff PPE, and extensive cleaning of exam room while observing appropriate contact time as indicated for disinfecting solutions.    HPI GERHARD Shannon presents for f/up -  He has been receiving health care at the [place].  The only information he has with him is a renal ultrasound that was done about 1 month ago.  The ultrasound shows nonobstructive right nephrolithiasis.  There is also left renal cortical thinning.  This was done during an episode of right flank pain.  He tells me the pain has resolved.  He is active and denies any recent episodes of chest pain, shortness of breath, dyspnea on exertion, diaphoresis, edema, fatigue.  Outpatient Medications Prior to Visit  Medication Sig Dispense Refill   ALPRAZolam (XANAX) 0.5 MG tablet Take 0.5 mg by mouth daily as needed for anxiety.     carvedilol (COREG) 12.5 MG tablet Take 1 tablet (12.5 mg total) by mouth 2 (two) times daily with a meal. 180 tablet 1   gabapentin (NEURONTIN) 100 MG capsule Take 100 mg by mouth 2 (two) times daily.     losartan-hydrochlorothiazide (HYZAAR) 50-12.5 MG tablet Take 1 tablet by mouth daily. 90 tablet 1   metFORMIN (GLUCOPHAGE XR) 750 MG 24 hr tablet Take 2 tablets (1,500 mg total) by mouth daily with breakfast. 180 tablet 1   rosuvastatin (CRESTOR) 40 MG tablet Take 40 mg by mouth daily.     sildenafil (REVATIO) 20 MG tablet Take 4 tablets (80 mg total) by mouth daily as needed. 60 tablet 11   tamsulosin (FLOMAX) 0.4 MG CAPS capsule Take 1 capsule (0.4 mg total) by mouth daily after supper. 90 capsule 1   cyclobenzaprine (FLEXERIL) 10 MG tablet Take 10 mg by mouth 2 (two) times daily as needed for muscle spasms.      magnesium oxide (MAG-OX) 400 MG tablet magnesium oxide 400 mg (241.3 mg magnesium) tablet  TK 1 T PO D     omeprazole (PRILOSEC) 20 MG capsule Take 1 capsule (20 mg total) by mouth daily. 90 capsule 1   No facility-administered medications prior to visit.    ROS Review of Systems  Constitutional:  Negative for chills, diaphoresis, fatigue and fever.  HENT: Negative.    Eyes: Negative.   Respiratory:  Negative for cough, chest tightness, shortness of breath and wheezing.   Cardiovascular:  Negative for chest pain, palpitations and leg swelling.  Gastrointestinal:  Negative for abdominal pain, diarrhea and nausea.  Endocrine: Negative.   Genitourinary: Negative.  Negative for difficulty urinating, dysuria and hematuria.  Musculoskeletal: Negative.  Negative for arthralgias and myalgias.  Skin: Negative.  Negative for color change and pallor.  Neurological:  Negative for dizziness, weakness and light-headedness.  Hematological:  Negative for adenopathy. Does not bruise/bleed easily.  Psychiatric/Behavioral: Negative.     Objective:  BP 126/82 (BP Location: Left Arm, Patient Position: Sitting, Cuff Size: Large)   Pulse 63   Temp 97.9 F (36.6 C) (Oral)   Ht '5\' 11"'$  (1.803 m)   Wt 220 lb (99.8 kg)   SpO2 99%   BMI 30.68 kg/m   BP Readings from Last 3 Encounters:  03/29/21 126/82  12/26/19 130/70  07/23/19 130/80  Wt Readings from Last 3 Encounters:  03/29/21 220 lb (99.8 kg)  12/26/19 228 lb 4.8 oz (103.6 kg)  07/23/19 219 lb (99.3 kg)    Physical Exam Vitals reviewed.  HENT:     Nose: Nose normal.     Mouth/Throat:     Mouth: Mucous membranes are moist.  Eyes:     Conjunctiva/sclera: Conjunctivae normal.  Cardiovascular:     Rate and Rhythm: Normal rate and regular rhythm.     Heart sounds: Normal heart sounds, S1 normal and S2 normal. No murmur heard.    Comments: EKG- NSR, 60 bpm Normal EKG Pulmonary:     Breath sounds: No stridor. No wheezing, rhonchi or  rales.  Abdominal:     General: Abdomen is protuberant. Bowel sounds are normal. There is no distension.     Palpations: Abdomen is soft. There is no hepatomegaly, splenomegaly or mass.     Tenderness: There is no abdominal tenderness.  Musculoskeletal:        General: Normal range of motion.     Cervical back: Neck supple.     Right lower leg: No edema.     Left lower leg: No edema.  Skin:    General: Skin is warm and dry.  Neurological:     General: No focal deficit present.     Mental Status: He is alert.    Lab Results  Component Value Date   WBC 6.1 03/29/2021   HGB 14.1 03/29/2021   HCT 42.2 03/29/2021   PLT 164.0 03/29/2021   GLUCOSE 128 (H) 03/29/2021   CHOL 110 03/29/2021   TRIG 53.0 03/29/2021   HDL 51.10 03/29/2021   LDLCALC 49 03/29/2021   ALT 17 03/29/2021   AST 20 03/29/2021   NA 140 03/29/2021   K 4.3 03/29/2021   CL 103 03/29/2021   CREATININE 1.09 03/29/2021   BUN 14 03/29/2021   CO2 29 03/29/2021   TSH 1.66 03/29/2021   PSA 0.37 03/29/2021   INR 1.14 08/28/2016   HGBA1C 6.7 (H) 03/29/2021   MICROALBUR <0.7 03/29/2021    CT Head Wo Contrast  Result Date: 07/06/2019 CLINICAL DATA:  Syncopal episode in bathroom with head trauma. Patient possibly on blood thinners. EXAM: CT HEAD WITHOUT CONTRAST TECHNIQUE: Contiguous axial images were obtained from the base of the skull through the vertex without intravenous contrast. COMPARISON:  06/02/2017 FINDINGS: Brain: No evidence of acute infarction, hemorrhage, hydrocephalus, extra-axial collection or mass lesion/mass effect. Vascular: No hyperdense vessel or unexpected calcification. Skull: Normal. Negative for fracture or focal lesion. Sinuses/Orbits: No acute finding. Other: None. IMPRESSION: Normal head CT. Electronically Signed   By: Marin Olp M.D.   On: 07/06/2019 05:24    Assessment & Plan:   Lucca was seen today for hyperlipidemia and diabetes.  Diagnoses and all orders for this  visit:  Essential hypertension- His blood pressure is well controlled. -     CBC with Differential/Platelet; Future -     Basic metabolic panel; Future -     TSH; Future -     Urinalysis, Routine w reflex microscopic; Future -     Hepatic function panel; Future -     Hepatic function panel -     Urinalysis, Routine w reflex microscopic -     TSH -     Basic metabolic panel -     CBC with Differential/Platelet  Type II diabetes mellitus with manifestations ([place])- His blood sugar is adequately well controlled. -  Basic metabolic panel; Future -     Microalbumin / creatinine urine ratio; Future -     Urinalysis, Routine w reflex microscopic; Future -     EKG 12-Lead -     Urinalysis, Routine w reflex microscopic -     Microalbumin / creatinine urine ratio -     Basic metabolic panel -     Hemoglobin A1c; Future  BPH with obstruction/lower urinary tract symptoms- His PSA is normal and he has no symptoms that need to be treated. -     PSA; Future -     PSA  Hyperlipidemia with target LDL less than 100- LDL goal achieved. Doing well on the statin  -     Lipid panel; Future -     TSH; Future -     Hepatic function panel; Future -     Hepatic function panel -     TSH -     Lipid panel  Vitamin D deficiency -     VITAMIN D 25 Hydroxy (Vit-D Deficiency, Fractures); Future -     VITAMIN D 25 Hydroxy (Vit-D Deficiency, Fractures)  I have discontinued Kile A. Baise's cyclobenzaprine, omeprazole, and magnesium oxide. I am also having him maintain his metFORMIN, rosuvastatin, tamsulosin, gabapentin, ALPRAZolam, carvedilol, losartan-hydrochlorothiazide, and sildenafil.  No orders of the defined types were placed in this encounter.    Follow-up: Return in about 4 months (around 07/29/2021).  Scarlette Calico, MD

## 2021-03-30 ENCOUNTER — Encounter: Payer: Self-pay | Admitting: Internal Medicine

## 2021-03-31 ENCOUNTER — Other Ambulatory Visit: Payer: Self-pay | Admitting: Internal Medicine

## 2021-03-31 DIAGNOSIS — E559 Vitamin D deficiency, unspecified: Secondary | ICD-10-CM

## 2021-03-31 MED ORDER — CHOLECALCIFEROL 50 MCG (2000 UT) PO TABS: 1.0000 | ORAL_TABLET | Freq: Every day | ORAL | 1 refills | Status: DC

## 2021-08-02 ENCOUNTER — Ambulatory Visit (INDEPENDENT_AMBULATORY_CARE_PROVIDER_SITE_OTHER): Payer: Medicare Other

## 2021-08-02 ENCOUNTER — Encounter: Payer: Self-pay | Admitting: Internal Medicine

## 2021-08-02 ENCOUNTER — Ambulatory Visit (INDEPENDENT_AMBULATORY_CARE_PROVIDER_SITE_OTHER): Payer: Medicare Other | Admitting: Internal Medicine

## 2021-08-02 ENCOUNTER — Other Ambulatory Visit: Payer: Self-pay

## 2021-08-02 VITALS — BP 132/78 | HR 74 | Temp 98.0°F | Resp 16 | Ht 71.0 in | Wt 226.0 lb

## 2021-08-02 DIAGNOSIS — M545 Low back pain, unspecified: Secondary | ICD-10-CM

## 2021-08-02 DIAGNOSIS — E559 Vitamin D deficiency, unspecified: Secondary | ICD-10-CM | POA: Diagnosis not present

## 2021-08-02 DIAGNOSIS — E118 Type 2 diabetes mellitus with unspecified complications: Secondary | ICD-10-CM | POA: Diagnosis not present

## 2021-08-02 DIAGNOSIS — K635 Polyp of colon: Secondary | ICD-10-CM

## 2021-08-02 DIAGNOSIS — G8929 Other chronic pain: Secondary | ICD-10-CM

## 2021-08-02 DIAGNOSIS — I10 Essential (primary) hypertension: Secondary | ICD-10-CM

## 2021-08-02 DIAGNOSIS — M5136 Other intervertebral disc degeneration, lumbar region: Secondary | ICD-10-CM

## 2021-08-02 LAB — HEMOGLOBIN A1C: Hgb A1c MFr Bld: 6.6 % — ABNORMAL HIGH (ref 4.6–6.5)

## 2021-08-02 LAB — URINALYSIS, ROUTINE W REFLEX MICROSCOPIC
Bilirubin Urine: NEGATIVE
Hgb urine dipstick: NEGATIVE
Ketones, ur: NEGATIVE
Leukocytes,Ua: NEGATIVE
Nitrite: NEGATIVE
RBC / HPF: NONE SEEN (ref 0–?)
Specific Gravity, Urine: 1.01 (ref 1.000–1.030)
Total Protein, Urine: NEGATIVE
Urine Glucose: NEGATIVE
Urobilinogen, UA: 0.2 (ref 0.0–1.0)
pH: 6.5 (ref 5.0–8.0)

## 2021-08-02 LAB — BASIC METABOLIC PANEL
BUN: 16 mg/dL (ref 6–23)
CO2: 30 mEq/L (ref 19–32)
Calcium: 9.7 mg/dL (ref 8.4–10.5)
Chloride: 103 mEq/L (ref 96–112)
Creatinine, Ser: 1.14 mg/dL (ref 0.40–1.50)
GFR: 66.57 mL/min (ref >60.00)
Glucose, Bld: 94 mg/dL (ref 70–99)
Potassium: 3.8 mEq/L (ref 3.5–5.1)
Sodium: 139 mEq/L (ref 135–145)

## 2021-08-02 MED ORDER — CHOLECALCIFEROL 50 MCG (2000 UT) PO TABS: 1.0000 | ORAL_TABLET | Freq: Every day | ORAL | 1 refills | Status: DC

## 2021-08-02 NOTE — Progress Notes (Signed)
Subjective:  Patient ID: Marjory Lies, male    DOB: [birth year]  Age: 65 y.o. MRN: [id number]  CC: Back Pain, Hypertension, and Diabetes  This visit occurred during the SARS-CoV-2 public health emergency.  Safety protocols were in place, including screening questions prior to the visit, additional usage of staff PPE, and extensive cleaning of exam room while observing appropriate contact time as indicated for disinfecting solutions.    HPI Bence A Shughart presents for f/up -   He has been exercising and does not experience chest pain, shortness of breath, palpitations, diaphoresis, or edema.  He complains of chronic aching in his lower back.  The pain does not radiate into his lower extremities.  He has not gotten much symptom relief with Tylenol.  He denies any new paresthesias.  Outpatient Medications Prior to Visit  Medication Sig Dispense Refill   ALPRAZolam (XANAX) 0.5 MG tablet Take 0.5 mg by mouth daily as needed for anxiety.     carvedilol (COREG) 12.5 MG tablet Take 1 tablet (12.5 mg total) by mouth 2 (two) times daily with a meal. 180 tablet 1   gabapentin (NEURONTIN) 100 MG capsule Take 100 mg by mouth 2 (two) times daily.     losartan-hydrochlorothiazide (HYZAAR) 50-12.5 MG tablet Take 1 tablet by mouth daily. 90 tablet 1   metFORMIN (GLUCOPHAGE XR) 750 MG 24 hr tablet Take 2 tablets (1,500 mg total) by mouth daily with breakfast. 180 tablet 1   rosuvastatin (CRESTOR) 40 MG tablet Take 40 mg by mouth daily.     sildenafil (REVATIO) 20 MG tablet Take 4 tablets (80 mg total) by mouth daily as needed. 60 tablet 11   tamsulosin (FLOMAX) 0.4 MG CAPS capsule Take 1 capsule (0.4 mg total) by mouth daily after supper. 90 capsule 1   Cholecalciferol 50 MCG (2000 UT) TABS Take 1 tablet (2,000 Units total) by mouth daily. 90 tablet 1   No facility-administered medications prior to visit.    ROS Review of Systems  Constitutional:  Negative for appetite change, diaphoresis, fatigue and  unexpected weight change.  HENT: Negative.    Eyes: Negative.   Respiratory:  Negative for chest tightness, shortness of breath and wheezing.   Cardiovascular:  Negative for chest pain, palpitations and leg swelling.  Gastrointestinal:  Negative for abdominal pain, constipation, diarrhea, nausea and vomiting.  Endocrine: Negative.   Genitourinary: Negative.  Negative for difficulty urinating.  Musculoskeletal:  Positive for back pain. Negative for arthralgias and myalgias.  Skin: Negative.  Negative for color change and pallor.  Neurological:  Negative for dizziness, weakness and numbness.  Hematological:  Negative for adenopathy. Does not bruise/bleed easily.  Psychiatric/Behavioral: Negative.     Objective:  BP 132/78 (BP Location: Right Arm, Patient Position: Sitting, Cuff Size: Large)   Pulse 74   Temp 98 F (36.7 C) (Oral)   Resp 16   Ht 5\' 11"  (1.803 m)   Wt 226 lb (102.5 kg)   SpO2 98%   BMI 31.52 kg/m   BP Readings from Last 3 Encounters:  08/02/21 132/78  03/29/21 126/82  12/26/19 130/70    Wt Readings from Last 3 Encounters:  08/02/21 226 lb (102.5 kg)  03/29/21 220 lb (99.8 kg)  12/26/19 228 lb 4.8 oz (103.6 kg)    Physical Exam Vitals reviewed.  Constitutional:      Appearance: Normal appearance.  HENT:     Nose: Nose normal.     Mouth/Throat:     Mouth: Mucous membranes are  moist.  Eyes:     General: No scleral icterus.    Conjunctiva/sclera: Conjunctivae normal.  Cardiovascular:     Rate and Rhythm: Normal rate and regular rhythm.     Heart sounds: No murmur heard. Pulmonary:     Effort: Pulmonary effort is normal.     Breath sounds: No stridor. No wheezing, rhonchi or rales.  Abdominal:     General: Abdomen is flat.     Palpations: There is no mass.     Tenderness: There is no abdominal tenderness. There is no guarding.     Hernia: No hernia is present.  Musculoskeletal:        General: Normal range of motion.     Cervical back: Neck  supple.     Lumbar back: Normal. No deformity, tenderness or bony tenderness. Normal range of motion. Negative right straight leg raise test and negative left straight leg raise test.     Right lower leg: No edema.     Left lower leg: No edema.  Lymphadenopathy:     Cervical: No cervical adenopathy.  Skin:    General: Skin is warm and dry.  Neurological:     General: No focal deficit present.     Mental Status: He is alert.  Psychiatric:        Mood and Affect: Mood normal.        Behavior: Behavior normal.    Lab Results  Component Value Date   WBC 6.1 03/29/2021   HGB 14.1 03/29/2021   HCT 42.2 03/29/2021   PLT 164.0 03/29/2021   GLUCOSE 94 08/02/2021   CHOL 110 03/29/2021   TRIG 53.0 03/29/2021   HDL 51.10 03/29/2021   LDLCALC 49 03/29/2021   ALT 17 03/29/2021   AST 20 03/29/2021   NA 139 08/02/2021   K 3.8 08/02/2021   CL 103 08/02/2021   CREATININE 1.14 08/02/2021   BUN 16 08/02/2021   CO2 30 08/02/2021   TSH 1.66 03/29/2021   PSA 0.37 03/29/2021   INR 1.14 08/28/2016   HGBA1C 6.6 (H) 08/02/2021   MICROALBUR <0.7 03/29/2021    CT Head Wo Contrast  Result Date: 07/06/2019 CLINICAL DATA:  Syncopal episode in bathroom with head trauma. Patient possibly on blood thinners. EXAM: CT HEAD WITHOUT CONTRAST TECHNIQUE: Contiguous axial images were obtained from the base of the skull through the vertex without intravenous contrast. COMPARISON:  06/02/2017 FINDINGS: Brain: No evidence of acute infarction, hemorrhage, hydrocephalus, extra-axial collection or mass lesion/mass effect. Vascular: No hyperdense vessel or unexpected calcification. Skull: Normal. Negative for fracture or focal lesion. Sinuses/Orbits: No acute finding. Other: None. IMPRESSION: Normal head CT. Electronically Signed   By: Marin Olp M.D.   On: 07/06/2019 05:24   DG Lumbar Spine Complete  Result Date: 08/02/2021 CLINICAL DATA:  Low back pain radiating to the right side, no known injury, initial  encounter EXAM: LUMBAR SPINE - COMPLETE 4+ VIEW COMPARISON:  01/31/2017 FINDINGS: Five lumbar type vertebral bodies are well visualized. Vertebral body height is well maintained. No pars defects are noted. Very mild degenerative anterolisthesis of L4 on L5 is noted. Disc space narrowing at L4-5 and L5-S1 is seen. No soft tissue abnormality is noted. IMPRESSION: Mild degenerative change without acute abnormality. Electronically Signed   By: Inez Catalina M.D.   On: 08/02/2021 21:46     Assessment & Plan:   Jaylon was seen today for back pain, hypertension and diabetes.  Diagnoses and all orders for this visit:  Type II  diabetes mellitus with manifestations ([place])- His blood sugar is adequately well controlled. -     Basic metabolic panel; Future -     Hemoglobin A1c; Future -     Hemoglobin A1c -     Basic metabolic panel -     HM Diabetes Foot Exam  Vitamin D deficiency -     Cholecalciferol 50 MCG (2000 UT) TABS; Take 1 tablet (2,000 Units total) by mouth daily.  Chronic right-sided low back pain without sciatica- His plain films and exam are reassuring.  Will treat with celecoxib. -     DG Lumbar Spine Complete; Future -     celecoxib (CELEBREX) 100 MG capsule; Take 1 capsule (100 mg total) by mouth 2 (two) times daily.  Essential hypertension- His blood pressure is adequately well controlled. -     Urinalysis, Routine w reflex microscopic; Future -     Urinalysis, Routine w reflex microscopic  DDD (degenerative disc disease), lumbar -     celecoxib (CELEBREX) 100 MG capsule; Take 1 capsule (100 mg total) by mouth 2 (two) times daily.  Polyp of colon, unspecified part of colon, unspecified type -     Ambulatory referral to Gastroenterology  I am having Jaxin A. Bonk start on celecoxib. I am also having him maintain his metFORMIN, rosuvastatin, tamsulosin, gabapentin, ALPRAZolam, carvedilol, losartan-hydrochlorothiazide, sildenafil, and Cholecalciferol.  Meds ordered this  encounter  Medications   Cholecalciferol 50 MCG (2000 UT) TABS    Sig: Take 1 tablet (2,000 Units total) by mouth daily.    Dispense:  90 tablet    Refill:  1   celecoxib (CELEBREX) 100 MG capsule    Sig: Take 1 capsule (100 mg total) by mouth 2 (two) times daily.    Dispense:  180 capsule    Refill:  1     Follow-up: Return in about 6 months (around 01/31/2022).  Scarlette Calico, MD

## 2021-08-02 NOTE — Patient Instructions (Signed)
Lumbosacral Radiculopathy °Lumbosacral radiculopathy is a condition that involves the spinal nerves and nerve roots in the low back and bottom of the spine. The condition develops when these nerves and nerve roots move out of place or become inflamed and cause symptoms. °What are the causes? °This condition may be caused by: °Pressure from a disk that bulges out of place (herniated disk). A disk is a plate of soft cartilage that separates bones in the spine. °Disk changes that occur with age (disk degeneration). °A narrowing of the bones of the lower back (spinal stenosis). °A tumor. °An infection. °An injury that places sudden pressure on the disks that cushion the bones of your lower spine. °What increases the risk? °You are more likely to develop this condition if: °You are a male who is 30-50 years old. °You are a male who is 50-60 years old. °You use improper technique when lifting things. °You are overweight or live a sedentary lifestyle. °You smoke. °Your work requires frequent lifting. °You do repetitive activities that strain the spine. °What are the signs or symptoms? °Symptoms of this condition include: °Pain that goes down from your back into your legs (sciatica), usually on one side of the body. This is the most common symptom. The pain may be worse when you sit, cough, or sneeze. °Tingling and numbness in your legs. °Muscle weakness in your legs. °Loss of bladder control or bowel control. °How is this diagnosed? °This condition may be diagnosed based on: °Your symptoms and medical history. °A physical exam. °If the pain lasts, you may have tests, such as: °MRI. °X-ray. °CT scan. °A type of CT scan used to examine the spinal canal after injecting a dye into your spine (myelogram). °A test to measure how electrical impulses move through a nerve (nerve conduction study). °A test to measure the electrical activity in muscles (electromyogram). °How is this treated? °In many cases, treatment is not needed  for this condition. With rest, the condition usually gets better over time. If treatment is needed, it may include: °Working with a physical therapist to improve strength and flexibility. °Taking pain medicine. °Applying heat or ice or both to the affected areas. °Having chiropractic spinal manipulation. °Using transcutaneous electrical nerve stimulation (TENS) therapy. °Getting a steroid injection in the spine. °Having surgery. This may be needed if other treatments do not help. Different types of surgery may be done depending on the cause of this condition. °Follow these instructions at home: °Activity °Avoid bending and any other activities that make the problem worse. °Maintain a proper position when standing or sitting. °When standing, keep your upper back and neck straight with your shoulders pulled back. Avoid slouching. °When sitting, keep your back straight and relax your shoulders. Do not round your shoulders or pull them backward. °Do not sit or stand in one place for long periods of time. °Take brief periods of rest throughout the day. This will reduce your pain. It is usually better to rest by lying down or standing, not sitting. °Mix in mild activity or stretching between long periods of rest. This will help to prevent stiffness and pain. °Get regular exercise. Ask your health care provider what activities are safe for you. If you were shown how to do any exercises or stretches, do them as told by your health care provider. °You may have to avoid lifting. Ask your health care provider how much you can safely lift. °Always use proper lifting technique, which includes: °Bending your knees. °Keeping the load close   to your body. °Avoiding twisting. °Managing pain °If directed, put ice on the affected area. To do this: °Put ice in a plastic bag. °Place a towel between your skin and the bag. °Leave the ice on for 20 minutes, 2-3 times a day. °Remove the ice if your skin turns bright red. This is very  important. If you cannot feel pain, heat, or cold, you have a greater risk of damage to the area. °If directed, apply heat to the affected area as often as told by your health care provider. Use the heat source that your health care provider recommends, such as a moist heat pack or a heating pad. °Place a towel between your skin and the heat source. °Leave the heat on for 20-30 minutes. °Remove the heat if your skin turns bright red. This is especially important if you are unable to feel pain, heat, or cold. You have a greater risk of getting burned. °Take over-the-counter and prescription medicines only as told by your health care provider. °General instructions °Sleep on a firm mattress in a comfortable position. Try lying on your side with your knees slightly bent. If you lie on your back, put a pillow under your knees. °Ask your health care provider if the medicine prescribed to you requires you to avoid driving or using machinery. °If your health care provider prescribed a diet or exercise program, follow it as told. °Keep all follow-up visits. This is important. °Contact a health care provider if: °Your pain does not get better over time, even when taking pain medicines. °Get help right away if: °You develop severe pain. °Your pain suddenly gets worse. °You develop increasing weakness in your legs. °You lose the ability to control your bladder or bowel. °You have difficulty walking or balancing. °You have a fever. °Summary °Lumbosacral radiculopathy is a condition that occurs when the spinal nerves and nerve roots in the lower part of the spine move out of place or become inflamed and cause symptoms. °Symptoms include pain, numbness, and tingling that go down from your back into your legs (sciatica), muscle weakness, and loss of bladder control or bowel control. °If directed, apply ice or heat or both to the affected area as told by your health care provider. °Follow instructions about activity, rest, and  proper lifting technique. °This information is not intended to replace advice given to you by your health care provider. Make sure you discuss any questions you have with your health care provider. °Document Revised: 02/17/2021 Document Reviewed: 02/17/2021 °Elsevier Patient Education © 2022 [employer] ° °

## 2021-08-04 DIAGNOSIS — M5136 Other intervertebral disc degeneration, lumbar region: Secondary | ICD-10-CM | POA: Insufficient documentation

## 2021-08-05 DIAGNOSIS — K635 Polyp of colon: Secondary | ICD-10-CM | POA: Insufficient documentation

## 2021-08-05 MED ORDER — CELECOXIB 100 MG PO CAPS: 100.0000 mg | ORAL_CAPSULE | Freq: Two times a day (BID) | ORAL | 1 refills | Status: DC

## 2021-12-06 LAB — HM DIABETES EYE EXAM

## 2022-01-14 ENCOUNTER — Emergency Department (HOSPITAL_BASED_OUTPATIENT_CLINIC_OR_DEPARTMENT_OTHER)
Admission: EM | Admit: 2022-01-14 | Discharge: 2022-01-14 | Disposition: A | Discharge status: Home / Self Care | Payer: Medicare Other | Attending: Emergency Medicine | Admitting: Emergency Medicine

## 2022-01-14 ENCOUNTER — Encounter (HOSPITAL_BASED_OUTPATIENT_CLINIC_OR_DEPARTMENT_OTHER): Payer: Self-pay

## 2022-01-14 ENCOUNTER — Emergency Department (HOSPITAL_BASED_OUTPATIENT_CLINIC_OR_DEPARTMENT_OTHER): Payer: Medicare Other

## 2022-01-14 DIAGNOSIS — Z7984 Long term (current) use of oral hypoglycemic drugs: Secondary | ICD-10-CM | POA: Insufficient documentation

## 2022-01-14 DIAGNOSIS — Z79899 Other long term (current) drug therapy: Secondary | ICD-10-CM | POA: Diagnosis not present

## 2022-01-14 DIAGNOSIS — I10 Essential (primary) hypertension: Secondary | ICD-10-CM | POA: Insufficient documentation

## 2022-01-14 DIAGNOSIS — Y9389 Activity, other specified: Secondary | ICD-10-CM | POA: Diagnosis not present

## 2022-01-14 DIAGNOSIS — S29012A Strain of muscle and tendon of back wall of thorax, initial encounter: Secondary | ICD-10-CM

## 2022-01-14 DIAGNOSIS — R110 Nausea: Secondary | ICD-10-CM | POA: Insufficient documentation

## 2022-01-14 DIAGNOSIS — S299XXA Unspecified injury of thorax, initial encounter: Secondary | ICD-10-CM | POA: Diagnosis present

## 2022-01-14 DIAGNOSIS — X58XXXA Exposure to other specified factors, initial encounter: Secondary | ICD-10-CM | POA: Diagnosis not present

## 2022-01-14 LAB — CBC WITH DIFFERENTIAL/PLATELET
Abs Immature Granulocytes: 0.02 10*3/uL (ref 0.00–0.07)
Basophils Absolute: 0.1 10*3/uL (ref 0.0–0.1)
Basophils Relative: 1 %
Eosinophils Absolute: 0.2 10*3/uL (ref 0.0–0.5)
Eosinophils Relative: 3 %
HCT: 44.6 % (ref 39.0–52.0)
Hemoglobin: 14.9 g/dL (ref 13.0–17.0)
Immature Granulocytes: 0 %
Lymphocytes Relative: 34 %
Lymphs Abs: 2.5 10*3/uL (ref 0.7–4.0)
MCH: 30.1 pg (ref 26.0–34.0)
MCHC: 33.4 g/dL (ref 30.0–36.0)
MCV: 90.1 fL (ref 80.0–100.0)
Monocytes Absolute: 0.5 10*3/uL (ref 0.1–1.0)
Monocytes Relative: 7 %
Neutro Abs: 4.1 10*3/uL (ref 1.7–7.7)
Neutrophils Relative %: 55 %
Platelets: 153 10*3/uL (ref 150–400)
RBC: 4.95 MIL/uL (ref 4.22–5.81)
RDW: 12.4 % (ref 11.5–15.5)
WBC: 7.3 10*3/uL (ref 4.0–10.5)
nRBC: 0 % (ref 0.0–0.2)

## 2022-01-14 LAB — BASIC METABOLIC PANEL
Anion gap: 9 (ref 5–15)
BUN: 12 mg/dL (ref 8–23)
CO2: 29 mmol/L (ref 22–32)
Calcium: 9.4 mg/dL (ref 8.9–10.3)
Chloride: 101 mmol/L (ref 98–111)
Creatinine, Ser: 1.16 mg/dL (ref 0.61–1.24)
GFR, Estimated: >60 mL/min (ref >60)
Glucose, Bld: 142 mg/dL — ABNORMAL HIGH (ref 70–99)
Potassium: 4.5 mmol/L (ref 3.5–5.1)
Sodium: 139 mmol/L (ref 135–145)

## 2022-01-14 LAB — TROPONIN I (HIGH SENSITIVITY): Troponin I (High Sensitivity): 4 ng/L (ref <18)

## 2022-01-14 MED ORDER — NAPROXEN 500 MG PO TABS: 500.0000 mg | ORAL_TABLET | Freq: Two times a day (BID) | ORAL | 0 refills | Status: DC | PRN

## 2022-01-14 MED ORDER — METHOCARBAMOL 500 MG PO TABS: 500.0000 mg | ORAL_TABLET | Freq: Three times a day (TID) | ORAL | 0 refills | Status: DC | PRN

## 2022-01-14 MED ORDER — FENTANYL CITRATE PF 50 MCG/ML IJ SOSY
50.0000 ug | PREFILLED_SYRINGE | Freq: Once | INTRAMUSCULAR | Status: AC
  Administered 2022-01-14: 50 ug via INTRAVENOUS
  Filled 2022-01-14: qty 1

## 2022-01-14 MED ORDER — IOHEXOL 350 MG/ML SOLN
100.0000 mL | Freq: Once | INTRAVENOUS | Status: AC | PRN
Start: 2022-01-14 — End: 2022-01-14
  Administered 2022-01-14: 100 mL via INTRAVENOUS

## 2022-01-14 NOTE — ED Triage Notes (Signed)
He states that a few days ago, as he was pouring liquid from a coffee pot, he experienced sudden, severe pain "right between my houlder blades at my upper back". He states [facility] who [facility] "back x-rays and they said they didn't see much, but they told me to see someone if it continued". His significant other is with him and asks "could it be a heart attack?". Pt. Is ambulatory and in no distress. He denies any paresthesiss/dysthesias of all fingers/upper extremities bilat. He c/o known, chronic "neuropathy pain" in both feet.

## 2022-01-14 NOTE — ED Provider Notes (Signed)
[facility] EMERGENCY DEPT Provider Note   CSN: [id number] Arrival date & time: 01/14/22  0938     History  Chief Complaint  Patient presents with   upper t spine area pain    Brandon Shannon is a 66 y.o. male.  Presents to ER due to concern for upper back pain.  Patient states that his suffering from periodic low back pain but normally does not have pain in his upper back.  A few days ago he noticed he was feeling generally unwell, poor energy, occasional nausea.  Then started having upper back pain.  The pain was worse with movement  HPI     Home Medications Prior to Admission medications   Medication Sig Start Date End Date Taking? Authorizing Provider  methocarbamol (ROBAXIN) 500 MG tablet Take 1 tablet (500 mg total) by mouth every 8 (eight) hours as needed for muscle spasms. 01/14/22  Yes Lucrezia Starch, MD  naproxen (NAPROSYN) 500 MG tablet Take 1 tablet (500 mg total) by mouth 2 (two) times daily as needed. 01/14/22  Yes Archer Vise, Ellwood Dense, MD  ALPRAZolam Duanne Moron) 0.5 MG tablet Take 0.5 mg by mouth daily as needed for anxiety.    [provider]  carvedilol (COREG) 12.5 MG tablet Take 1 tablet (12.5 mg total) by mouth 2 (two) times daily with a meal. 12/31/18   Janith Lima, MD  celecoxib (CELEBREX) 100 MG capsule Take 1 capsule (100 mg total) by mouth 2 (two) times daily. 08/05/21   Janith Lima, MD  Cholecalciferol 50 MCG (2000 UT) TABS Take 1 tablet (2,000 Units total) by mouth daily. 08/02/21   Janith Lima, MD  gabapentin (NEURONTIN) 100 MG capsule Take 100 mg by mouth 2 (two) times daily.    [provider]  losartan-hydrochlorothiazide (HYZAAR) 50-12.5 MG tablet Take 1 tablet by mouth daily. 07/23/19   Janith Lima, MD  metFORMIN (GLUCOPHAGE XR) 750 MG 24 hr tablet Take 2 tablets (1,500 mg total) by mouth daily with breakfast. 09/16/15   Janith Lima, MD  rosuvastatin (CRESTOR) 40 MG tablet Take 40 mg by mouth daily.     [provider]  sildenafil (REVATIO) 20 MG tablet Take 4 tablets (80 mg total) by mouth daily as needed. 07/23/19   Janith Lima, MD  tamsulosin (FLOMAX) 0.4 MG CAPS capsule Take 1 capsule (0.4 mg total) by mouth daily after supper. 09/07/16   Janith Lima, MD      Allergies    Atorvastatin and Pravastatin    Review of Systems   Review of Systems  Physical Exam Updated Vital Signs BP (!) 147/92   Pulse 68   Temp 97.9 F (36.6 C) (Oral)   Resp 17   Ht '5\' 10"'$  (1.778 m)   Wt 97.5 kg   SpO2 100%   BMI 30.85 kg/m  Physical Exam  ED Results / Procedures / Treatments   Labs (all labs ordered are listed, but only abnormal results are displayed) Labs Reviewed  BASIC METABOLIC PANEL - Abnormal; Notable for the following components:      Result Value   Glucose, Bld 142 (*)    All other components within normal limits  CBC WITH DIFFERENTIAL/PLATELET  TROPONIN I (HIGH SENSITIVITY)    EKG EKG Interpretation  Date/Time:  Saturday Jan 14 2022 09:33:00 EDT Ventricular Rate:  66 PR Interval:  194 QRS Duration: 89 QT Interval:  401 QTC Calculation: 421 R Axis:   73 Text Interpretation: Sinus  rhythm Minimal ST elevation, inferior leads Confirmed by Madalyn Rob [phone]) on 01/14/2022 9:38:11 AM  Radiology CT Angio Chest/Abd/Pel for Dissection W and/or Wo Contrast  Result Date: 01/14/2022 CLINICAL DATA:  Severe chest pain radiating to back. Suspected aortic dissection. EXAM: CT ANGIOGRAPHY CHEST, ABDOMEN AND PELVIS TECHNIQUE: Non-contrast CT of the chest was initially obtained. Multidetector CT imaging through the chest, abdomen and pelvis was performed using the standard protocol during bolus administration of intravenous contrast. Multiplanar reconstructed images and MIPs were obtained and reviewed to evaluate the vascular anatomy. RADIATION DOSE REDUCTION: This exam was performed according to the departmental dose-optimization program which includes automated  exposure control, adjustment of the mA and/or kV according to patient size and/or use of iterative reconstruction technique. CONTRAST:  151m OMNIPAQUE IOHEXOL 350 MG/ML SOLN COMPARISON:  AP only CT on 06/27/2009 FINDINGS: CTA CHEST FINDINGS Cardiovascular: No evidence of thoracic aortic dissection or aneurysm. No pulmonary embolism identified. Mediastinum/Nodes: No masses or pathologically enlarged lymph nodes identified. Lungs/Pleura: No pulmonary mass, infiltrate, or effusion. Musculoskeletal: No suspicious bone lesions identified. Review of the MIP images confirms the above findings. CTA ABDOMEN AND PELVIS FINDINGS VASCULAR Aorta: No aortic aneurysm or dissection. Celiac: No atherosclerotic plaque or stenosis. SMA: No atherosclerotic plaque or stenosis. Renals: No atherosclerotic plaque or stenosis. IMA: Patent Inflow: Unremarkable Veins: Unremarkable Review of the MIP images confirms the above findings. NON-VASCULAR Hepatobiliary: No hepatic masses identified. Gallbladder is unremarkable. No evidence of biliary ductal dilatation. Pancreas:  No mass or inflammatory changes. Spleen: Within normal limits in size and appearance. Adrenals/Urinary Tract: No masses identified. No evidence of ureteral calculi or hydronephrosis. Stomach/Bowel: No evidence of obstruction, inflammatory process or abnormal fluid collections. Vascular/Lymphatic: No pathologically enlarged lymph nodes. No acute vascular findings. Reproductive:  No mass or other significant abnormality. Other:  None. Musculoskeletal:  No suspicious bone lesions identified. Review of the MIP images confirms the above findings. IMPRESSION: No evidence of aortic dissection, aneurysm, or other acute findings. Electronically Signed   By: JMarlaine HindM.D.   On: 01/14/2022 12:32    Procedures Procedures    Medications Ordered in ED Medications  fentaNYL (SUBLIMAZE) injection 50 mcg (50 mcg Intravenous Given 01/14/22 1037)  iohexol (OMNIPAQUE) 350 MG/ML  injection 100 mL (100 mLs Intravenous Contrast Given 01/14/22 1142)    ED Course/ Medical Decision Making/ A&P                           Medical Decision Making Amount and/or Complexity of Data Reviewed Labs: ordered. Radiology: ordered.  Risk Prescription drug management.   66year old male presenting to ER due to concern for upper thoracic back pain.  On exam patient was well-appearing, noted to be somewhat hypertensive but otherwise vitals are stable.  Based on the initial description of pain and my suspicion was most likely for MSK etiology however given the location, hypertension, concern for either acute aortic process versus acute cardiac process and feel patient would benefit from additional work-up given his age and location of the pain.  EKG without acute ischemic change, troponin within normal limits, doubt ACS.  CTA chest was independently reviewed and interpreted by myself.  I agree with radiology report, no acute aortic pathology.  No other acute thoracic or abdominal pelvic pathology.  On reassessment patient's symptoms are markedly improved.  Based on the reassuring work-up that was completed today ultimately feel symptoms are most likely MSK in etiology.  Recommend supportive care, follow-up with his primary  care doctor.  Reviewed return precautions with patient and his wife at bedside, discharged home.    After the discussed management above, the patient was determined to be safe for discharge.  The patient was in agreement with this plan and all questions regarding their care were answered.  ED return precautions were discussed and the patient will return to the ED with any significant worsening of condition. ]        Final Clinical Impression(s) / ED Diagnoses Final diagnoses:  Strain of thoracic back region    Rx / DC Orders ED Discharge Orders          Ordered    naproxen (NAPROSYN) 500 MG tablet  2 times daily PRN        01/14/22 1505    methocarbamol  (ROBAXIN) 500 MG tablet  Every 8 hours PRN        01/14/22 1505              Lucrezia Starch, MD 01/15/22 [phone]

## 2022-01-14 NOTE — ED Notes (Signed)
Back from CT

## 2022-01-14 NOTE — Discharge Instructions (Signed)
Please follow-up with your primary care doctor.  Recommend taking Tylenol and anti-inflammatory such as the prescribed naproxen for your pain.  Can also take the muscle relaxer as needed.  If you are having increasing pain, any chest pain, difficulty in breathing or other new concerning symptom, come back to ER for reassessment.

## 2022-01-27 ENCOUNTER — Ambulatory Visit (AMBULATORY_SURGERY_CENTER): Payer: Self-pay | Admitting: *Deleted

## 2022-01-27 VITALS — Ht 71.0 in | Wt 222.0 lb

## 2022-01-27 DIAGNOSIS — Z86010 Personal history of colonic polyps: Secondary | ICD-10-CM

## 2022-01-27 MED ORDER — PLENVU 140 G PO SOLR: 1.0000 | Freq: Once | ORAL | 0 refills | Status: AC

## 2022-01-27 NOTE — Progress Notes (Signed)

## 2022-01-31 ENCOUNTER — Ambulatory Visit (INDEPENDENT_AMBULATORY_CARE_PROVIDER_SITE_OTHER): Payer: Medicare Other | Admitting: Internal Medicine

## 2022-01-31 ENCOUNTER — Encounter: Payer: Self-pay | Admitting: Internal Medicine

## 2022-01-31 VITALS — BP 124/82 | HR 68 | Temp 97.6°F | Resp 16 | Ht 71.0 in | Wt 222.0 lb

## 2022-01-31 DIAGNOSIS — I10 Essential (primary) hypertension: Secondary | ICD-10-CM | POA: Diagnosis not present

## 2022-01-31 DIAGNOSIS — R9431 Abnormal electrocardiogram [ECG] [EKG]: Secondary | ICD-10-CM | POA: Insufficient documentation

## 2022-01-31 DIAGNOSIS — L910 Hypertrophic scar: Secondary | ICD-10-CM

## 2022-01-31 DIAGNOSIS — R0609 Other forms of dyspnea: Secondary | ICD-10-CM | POA: Insufficient documentation

## 2022-01-31 DIAGNOSIS — E118 Type 2 diabetes mellitus with unspecified complications: Secondary | ICD-10-CM | POA: Diagnosis not present

## 2022-01-31 LAB — BASIC METABOLIC PANEL
BUN: 17 mg/dL (ref 6–23)
CO2: 28 mEq/L (ref 19–32)
Calcium: 9.7 mg/dL (ref 8.4–10.5)
Chloride: 101 mEq/L (ref 96–112)
Creatinine, Ser: 1.11 mg/dL (ref 0.40–1.50)
GFR: 68.49 mL/min (ref >60.00)
Glucose, Bld: 156 mg/dL — ABNORMAL HIGH (ref 70–99)
Potassium: 3.9 mEq/L (ref 3.5–5.1)
Sodium: 138 mEq/L (ref 135–145)

## 2022-01-31 LAB — URINALYSIS, ROUTINE W REFLEX MICROSCOPIC
Bilirubin Urine: NEGATIVE
Hgb urine dipstick: NEGATIVE
Ketones, ur: NEGATIVE
Leukocytes,Ua: NEGATIVE
Nitrite: NEGATIVE
RBC / HPF: NONE SEEN (ref 0–?)
Specific Gravity, Urine: 1.02 (ref 1.000–1.030)
Total Protein, Urine: NEGATIVE
Urine Glucose: NEGATIVE
Urobilinogen, UA: 0.2 (ref 0.0–1.0)
pH: 6.5 (ref 5.0–8.0)

## 2022-01-31 LAB — HEMOGLOBIN A1C: Hgb A1c MFr Bld: 7.3 % — ABNORMAL HIGH (ref 4.6–6.5)

## 2022-01-31 LAB — MICROALBUMIN / CREATININE URINE RATIO
Creatinine,U: 97 mg/dL
Microalb Creat Ratio: 0.7 mg/g (ref 0.0–30.0)
Microalb, Ur: <0.7 mg/dL (ref 0.0–1.9)

## 2022-01-31 LAB — CORTISOL: Cortisol, Plasma: 19.2 ug/dL

## 2022-01-31 MED ORDER — METFORMIN HCL ER 750 MG PO TB24: 750.0000 mg | ORAL_TABLET | Freq: Every day | ORAL | 1 refills | Status: DC

## 2022-01-31 NOTE — Patient Instructions (Signed)

## 2022-01-31 NOTE — Progress Notes (Signed)
Subjective:  Patient ID: Brandon Shannon, male    DOB: Aug 27, 1956  Age: 66 y.o. MRN: [id number]  CC: Hypertension and Diabetes   HPI Brandon Shannon presents for f/up -   He complains of chronic fatigue, dyspnea on exertion, and diaphoresis.  He was seen recently for some left upper back pain at a local ER and a CT scan of the chest was unremarkable.  He is also followed by a [facility].  His symptoms have been evaluated and no cardiovascular disease has been identified.  Outpatient Medications Prior to Visit  Medication Sig Dispense Refill   ALPRAZolam (XANAX) 0.5 MG tablet Take 0.5 mg by mouth daily as needed for anxiety.     carvedilol (COREG) 12.5 MG tablet Take 1 tablet (12.5 mg total) by mouth 2 (two) times daily with a meal. 180 tablet 1   cetirizine (ZYRTEC) 10 MG tablet Take 1 tablet by mouth daily.     Cholecalciferol 50 MCG (2000 UT) TABS Take 1 tablet (2,000 Units total) by mouth daily. 90 tablet 1   fluticasone (FLONASE) 50 MCG/ACT nasal spray INSTILL 2 SPRAYS IN EACH NOSTRIL TWICE A DAY     gabapentin (NEURONTIN) 100 MG capsule Take 100 mg by mouth 2 (two) times daily.     losartan-hydrochlorothiazide (HYZAAR) 50-12.5 MG tablet Take 1 tablet by mouth daily. 90 tablet 1   methocarbamol (ROBAXIN) 500 MG tablet Take 1 tablet (500 mg total) by mouth every 8 (eight) hours as needed for muscle spasms. 20 tablet 0   naproxen (NAPROSYN) 500 MG tablet Take 1 tablet (500 mg total) by mouth 2 (two) times daily as needed. 20 tablet 0   rosuvastatin (CRESTOR) 40 MG tablet Take 40 mg by mouth daily.     sildenafil (VIAGRA) 100 MG tablet TAKE ONE TABLET BY MOUTH AS INSTRUCTED AS NEEDED (TAKE 1 HOUR PRIOR TO SEXUAL ACTIVITY *DO NOT EXCEED 1 DOSE PER 24 HOUR PERIOD*)     tamsulosin (FLOMAX) 0.4 MG CAPS capsule Take 1 capsule (0.4 mg total) by mouth daily after supper. 90 capsule 1   metFORMIN (GLUCOPHAGE XR) 750 MG 24 hr tablet Take 2 tablets (1,500 mg total) by mouth daily with breakfast. 180  tablet 1   No facility-administered medications prior to visit.    ROS Review of Systems  Constitutional:  Positive for fatigue. Negative for appetite change, chills, diaphoresis and unexpected weight change.  HENT: Negative.    Eyes: Negative.   Respiratory:  Positive for shortness of breath. Negative for cough, chest tightness, wheezing and stridor.   Cardiovascular:  Negative for chest pain, palpitations and leg swelling.  Gastrointestinal:  Negative for abdominal pain, constipation, diarrhea, nausea and vomiting.  Endocrine: Negative.   Genitourinary: Negative.   Musculoskeletal: Negative.   Skin: Negative.   Neurological: Negative.  Negative for dizziness, weakness, light-headedness and headaches.  Hematological:  Negative for adenopathy. Does not bruise/bleed easily.  Psychiatric/Behavioral: Negative.      Objective:  BP 124/82 (BP Location: Right Arm, Patient Position: Sitting, Cuff Size: Large)   Pulse 68   Temp 97.6 F (36.4 C) (Oral)   Resp 16   Ht '5\' 11"'$  (1.803 m)   Wt 222 lb (100.7 kg)   SpO2 97%   BMI 30.96 kg/m   BP Readings from Last 3 Encounters:  02/01/22 (!) 151/95  01/31/22 124/82  01/14/22 (!) 147/92    Wt Readings from Last 3 Encounters:  02/01/22 222 lb (100.7 kg)  01/31/22 222 lb (100.7  kg)  01/27/22 222 lb (100.7 kg)    Physical Exam Vitals reviewed.  HENT:     Nose: Nose normal.     Mouth/Throat:     Mouth: Mucous membranes are moist.  Eyes:     General: No scleral icterus.    Conjunctiva/sclera: Conjunctivae normal.  Cardiovascular:     Rate and Rhythm: Normal rate and regular rhythm.     Heart sounds: No murmur heard.    No gallop.  Pulmonary:     Effort: Pulmonary effort is normal.     Breath sounds: No stridor. No wheezing, rhonchi or rales.  Abdominal:     General: Abdomen is flat.     Palpations: There is no mass.     Tenderness: There is no abdominal tenderness. There is no guarding.     Hernia: No hernia is present.   Musculoskeletal:        General: Normal range of motion.     Cervical back: Neck supple.     Right lower leg: No edema.     Left lower leg: No edema.  Lymphadenopathy:     Cervical: No cervical adenopathy.  Skin:    General: Skin is warm and dry.  Neurological:     General: No focal deficit present.     Mental Status: He is alert.     Lab Results  Component Value Date   WBC 7.3 01/14/2022   HGB 14.9 01/14/2022   HCT 44.6 01/14/2022   PLT 153 01/14/2022   GLUCOSE 156 (H) 01/31/2022   CHOL 110 03/29/2021   TRIG 53.0 03/29/2021   HDL 51.10 03/29/2021   LDLCALC 49 03/29/2021   ALT 17 03/29/2021   AST 20 03/29/2021   NA 138 01/31/2022   K 3.9 01/31/2022   CL 101 01/31/2022   CREATININE 1.11 01/31/2022   BUN 17 01/31/2022   CO2 28 01/31/2022   TSH 1.66 03/29/2021   PSA 0.37 03/29/2021   INR 1.14 08/28/2016   HGBA1C 7.3 (H) 01/31/2022   MICROALBUR <0.7 01/31/2022    CT Angio Chest/Abd/Pel for Dissection W and/or Wo Contrast  Result Date: 01/14/2022 CLINICAL DATA:  Severe chest pain radiating to back. Suspected aortic dissection. EXAM: CT ANGIOGRAPHY CHEST, ABDOMEN AND PELVIS TECHNIQUE: Non-contrast CT of the chest was initially obtained. Multidetector CT imaging through the chest, abdomen and pelvis was performed using the standard protocol during bolus administration of intravenous contrast. Multiplanar reconstructed images and MIPs were obtained and reviewed to evaluate the vascular anatomy. RADIATION DOSE REDUCTION: This exam was performed according to the departmental dose-optimization program which includes automated exposure control, adjustment of the mA and/or kV according to patient size and/or use of iterative reconstruction technique. CONTRAST:  117m OMNIPAQUE IOHEXOL 350 MG/ML SOLN COMPARISON:  AP only CT on 06/27/2009 FINDINGS: CTA CHEST FINDINGS Cardiovascular: No evidence of thoracic aortic dissection or aneurysm. No pulmonary embolism identified.  Mediastinum/Nodes: No masses or pathologically enlarged lymph nodes identified. Lungs/Pleura: No pulmonary mass, infiltrate, or effusion. Musculoskeletal: No suspicious bone lesions identified. Review of the MIP images confirms the above findings. CTA ABDOMEN AND PELVIS FINDINGS VASCULAR Aorta: No aortic aneurysm or dissection. Celiac: No atherosclerotic plaque or stenosis. SMA: No atherosclerotic plaque or stenosis. Renals: No atherosclerotic plaque or stenosis. IMA: Patent Inflow: Unremarkable Veins: Unremarkable Review of the MIP images confirms the above findings. NON-VASCULAR Hepatobiliary: No hepatic masses identified. Gallbladder is unremarkable. No evidence of biliary ductal dilatation. Pancreas:  No mass or inflammatory changes. Spleen: Within normal limits in  size and appearance. Adrenals/Urinary Tract: No masses identified. No evidence of ureteral calculi or hydronephrosis. Stomach/Bowel: No evidence of obstruction, inflammatory process or abnormal fluid collections. Vascular/Lymphatic: No pathologically enlarged lymph nodes. No acute vascular findings. Reproductive:  No mass or other significant abnormality. Other:  None. Musculoskeletal:  No suspicious bone lesions identified. Review of the MIP images confirms the above findings. IMPRESSION: No evidence of aortic dissection, aneurysm, or other acute findings. Electronically Signed   By: Marlaine Hind M.D.   On: 01/14/2022 12:32    Assessment & Plan:   Osman was seen today for hypertension and diabetes.  Diagnoses and all orders for this visit:  Essential hypertension- His blood pressure is adequately well controlled. -     Basic metabolic panel; Future -     Urinalysis, Routine w reflex microscopic; Future -     Cortisol; Future -     Cortisol -     Urinalysis, Routine w reflex microscopic -     Basic metabolic panel  Type II diabetes mellitus with manifestations ([place])- His A1c is up to 7.3%.  I have asked him to restart metformin. -      Basic metabolic panel; Future -     Hemoglobin A1c; Future -     Microalbumin / creatinine urine ratio; Future -     Microalbumin / creatinine urine ratio -     Hemoglobin A1c -     Basic metabolic panel -     metFORMIN (GLUCOPHAGE XR) 750 MG 24 hr tablet; Take 1 tablet (750 mg total) by mouth daily with breakfast.  Keloid -     Ambulatory referral to Plastic Surgery   I have changed Lavonne A. Enderle's metFORMIN. I am also having him maintain his rosuvastatin, tamsulosin, gabapentin, ALPRAZolam, carvedilol, losartan-hydrochlorothiazide, Cholecalciferol, naproxen, methocarbamol, cetirizine, fluticasone, and sildenafil.  Meds ordered this encounter  Medications   metFORMIN (GLUCOPHAGE XR) 750 MG 24 hr tablet    Sig: Take 1 tablet (750 mg total) by mouth daily with breakfast.    Dispense:  90 tablet    Refill:  1     Follow-up: Return in about 6 months (around 08/02/2022).  Scarlette Calico, MD

## 2022-02-01 ENCOUNTER — Ambulatory Visit (INDEPENDENT_AMBULATORY_CARE_PROVIDER_SITE_OTHER): Payer: Medicare Other | Admitting: Plastic Surgery

## 2022-02-01 ENCOUNTER — Encounter: Payer: Self-pay | Admitting: Plastic Surgery

## 2022-02-01 VITALS — BP 151/95 | HR 75 | Ht 71.0 in | Wt 222.0 lb

## 2022-02-01 DIAGNOSIS — L298 Other pruritus: Secondary | ICD-10-CM

## 2022-02-01 DIAGNOSIS — L905 Scar conditions and fibrosis of skin: Secondary | ICD-10-CM

## 2022-02-01 DIAGNOSIS — L910 Hypertrophic scar: Secondary | ICD-10-CM

## 2022-02-01 NOTE — Progress Notes (Signed)
Referring Provider Janith Lima, MD [place],  [place] [address]   CC:  Chief Complaint  Patient presents with   Consult           Brandon Shannon is an 66 y.o. male.  HPI: Patient presents to discuss keloids on his central chest.  They have been present for quite a few years.  He believes they came on essentially spontaneously.  They have been injected with steroids in the past.  He is here for consultation to see if anything else can be done.  They are painful and itchy.  Allergies  Allergen Reactions   Atorvastatin Nausea And Vomiting   Pravastatin Other (See Comments)    Muscle pain    Outpatient Encounter Medications as of 02/01/2022  Medication Sig   ALPRAZolam (XANAX) 0.5 MG tablet Take 0.5 mg by mouth daily as needed for anxiety.   carvedilol (COREG) 12.5 MG tablet Take 1 tablet (12.5 mg total) by mouth 2 (two) times daily with a meal.   cetirizine (ZYRTEC) 10 MG tablet Take 1 tablet by mouth daily.   Cholecalciferol 50 MCG (2000 UT) TABS Take 1 tablet (2,000 Units total) by mouth daily.   fluticasone (FLONASE) 50 MCG/ACT nasal spray INSTILL 2 SPRAYS IN EACH NOSTRIL TWICE A DAY   gabapentin (NEURONTIN) 100 MG capsule Take 100 mg by mouth 2 (two) times daily.   losartan-hydrochlorothiazide (HYZAAR) 50-12.5 MG tablet Take 1 tablet by mouth daily.   metFORMIN (GLUCOPHAGE XR) 750 MG 24 hr tablet Take 1 tablet (750 mg total) by mouth daily with breakfast.   methocarbamol (ROBAXIN) 500 MG tablet Take 1 tablet (500 mg total) by mouth every 8 (eight) hours as needed for muscle spasms.   naproxen (NAPROSYN) 500 MG tablet Take 1 tablet (500 mg total) by mouth 2 (two) times daily as needed.   rosuvastatin (CRESTOR) 40 MG tablet Take 40 mg by mouth daily.   sildenafil (VIAGRA) 100 MG tablet TAKE ONE TABLET BY MOUTH AS INSTRUCTED AS NEEDED (TAKE 1 HOUR PRIOR TO SEXUAL ACTIVITY *DO NOT EXCEED 1 DOSE PER 24 HOUR PERIOD*)   tamsulosin (FLOMAX) 0.4 MG CAPS capsule Take 1  capsule (0.4 mg total) by mouth daily after supper.   No facility-administered encounter medications on file as of 02/01/2022.     Past Medical History:  Diagnosis Date   AKI (acute kidney injury) ([place])    Arthritis    "hands, knees" (08/28/2016)   CAP (community acquired pneumonia) 08/28/2016   GERD (gastroesophageal reflux disease)    Hyperlipidemia    Hypertension    Pneumonia ~ 2015   Seasonal allergies    Syncope    Syncope and collapse 08/28/2016   Type II diabetes mellitus ([place])     Past Surgical History:  Procedure Laterality Date   BUNIONECTOMY WITH HAMMERTOE RECONSTRUCTION Bilateral    COLONOSCOPY  10/27/2004   normal   COLONOSCOPY WITH PROPOFOL  12/08/2014   polyps-Dr.Kaplan   POLYPECTOMY      Family History  Problem Relation Age of Onset   Diabetes Mother    Heart disease Mother    Hypertension Mother    Arthritis Mother    Hypertension Sister    Hypertension Sister    Cancer Neg Hx    Alcohol abuse Neg Hx    Drug abuse Neg Hx    Early death Neg Hx    Kidney disease Neg Hx    Stroke Neg Hx    Colon cancer  Neg Hx     Social History   Social History Narrative   Not on file     Review of Systems General: Denies fevers, chills, weight loss CV: Denies chest pain, shortness of breath, palpitations  Physical Exam    02/01/2022   11:51 AM 01/31/2022    8:14 AM 01/27/2022   12:59 PM  Vitals with BMI  Height '5\' 11"'$  '5\' 11"'$  '5\' 11"'$   Weight 222 lbs 222 lbs 222 lbs  BMI 30.98 04.88 89.16  Systolic 945 038   Diastolic 95 82   Pulse 75 68     General:  No acute distress,  Alert and oriented, Non-Toxic, Normal speech and affect Examination shows multiple transverse keloids extending across his central chest from essentially the clavicular area down to the inferior sternal area.  These vary between 1 to 3 cm in height and are generally 12 to 15 cm in length.  Assessment/Plan Patient has multiple large keloids on his central chest.  I did discuss steroid  injection and surgical excision.  I explained to him recurrence after surgical excision would be high and potentially bigger than the way they currently are.  We did discussed excision coupled with radiation but he is not really interested in an invasive measures at this point in time.  He is also not interested in steroid injections given he has had them before and they were painful and yielded marginal benefit.  He will plan to follow-up in the future with any further questions or if he wants to reconsider.  Cindra Presume 02/01/2022, 12:29 PM

## 2022-02-10 ENCOUNTER — Ambulatory Visit (AMBULATORY_SURGERY_CENTER): Payer: Medicare Other | Admitting: Gastroenterology

## 2022-02-10 ENCOUNTER — Encounter: Payer: Self-pay | Admitting: Gastroenterology

## 2022-02-10 VITALS — BP 130/82 | HR 62 | Temp 98.7°F | Resp 17 | Ht 71.0 in | Wt 222.0 lb

## 2022-02-10 DIAGNOSIS — D123 Benign neoplasm of transverse colon: Secondary | ICD-10-CM | POA: Diagnosis not present

## 2022-02-10 DIAGNOSIS — Z86010 Personal history of colonic polyps: Secondary | ICD-10-CM | POA: Diagnosis not present

## 2022-02-10 DIAGNOSIS — Z09 Encounter for follow-up examination after completed treatment for conditions other than malignant neoplasm: Secondary | ICD-10-CM

## 2022-02-10 MED ORDER — SODIUM CHLORIDE 0.9 % IV SOLN: 500.0000 mL | Freq: Once | INTRAVENOUS | Status: DC

## 2022-02-10 NOTE — Progress Notes (Signed)
VS by CW  Pt's states no medical or surgical changes since previsit or office visit.  

## 2022-02-10 NOTE — Op Note (Signed)
[facility] Patient Name: Brandon Shannon Procedure Date: 02/10/2022 11:20 AM MRN: [id number] Endoscopist: Remo Lipps P. Havery Moros , MD Age: 66 Referring MD:  Date of Birth: 10-Jun-1956 Gender: Male Account #: [id number] Procedure:                Colonoscopy Indications:              High risk colon cancer surveillance: Personal                            history of colonic polyps - adenoma removed 11/2014 Medicines:                Monitored Anesthesia Care Procedure:                Pre-Anesthesia Assessment:                           - Prior to the procedure, a History and Physical                            was performed, and patient medications and                            allergies were reviewed. The patient's tolerance of                            previous anesthesia was also reviewed. The risks                            and benefits of the procedure and the sedation                            options and risks were discussed with the patient.                            All questions were answered, and informed consent                            was obtained. Prior Anticoagulants: The patient has                            taken no previous anticoagulant or antiplatelet                            agents. ASA Grade Assessment: II - A patient with                            mild systemic disease. After reviewing the risks                            and benefits, the patient was deemed in                            satisfactory condition to undergo the procedure.  After obtaining informed consent, the colonoscope                            was passed under direct vision. Throughout the                            procedure, the patient's blood pressure, pulse, and                            oxygen saturations were monitored continuously. The                            CF [id number] #[id number] was introduced through the anus                            and  advanced to the the cecum, identified by                            appendiceal orifice and ileocecal valve. The                            colonoscopy was performed without difficulty. The                            patient tolerated the procedure well. The quality                            of the bowel preparation was adequate. The                            ileocecal valve, appendiceal orifice, and rectum                            were photographed. Scope In: 11:34:19 AM Scope Out: 11:53:03 AM Scope Withdrawal Time: 0 hours 15 minutes 7 seconds  Total Procedure Duration: 0 hours 18 minutes 44 seconds  Findings:                 The perianal and digital rectal examinations were                            normal.                           A 3 mm polyp was found in the transverse colon. The                            polyp was sessile. The polyp was removed with a                            cold snare. Resection and retrieval were complete.                           A few small-mouthed diverticula were found in the  sigmoid colon.                           Internal hemorrhoids were found during                            retroflexion. The hemorrhoids were small.                           The exam was otherwise without abnormality. Complications:            No immediate complications. Estimated blood loss:                            Minimal. Estimated Blood Loss:     Estimated blood loss was minimal. Impression:               - One 3 mm polyp in the transverse colon, removed                            with a cold snare. Resected and retrieved.                           - Diverticulosis in the sigmoid colon.                           - Internal hemorrhoids.                           - The examination was otherwise normal. Recommendation:           - Patient has a contact number available for                            emergencies. The signs and symptoms of  potential                            delayed complications were discussed with the                            patient. Return to normal activities tomorrow.                            Written discharge instructions were provided to the                            patient.                           - Resume previous diet.                           - Continue present medications.                           - Await pathology results. Remo Lipps P. Jerrel Tiberio, MD 02/10/2022 11:57:18 AM This report has been signed electronically.

## 2022-02-10 NOTE — Progress Notes (Signed)
Called to room to assist during endoscopic procedure.  Patient ID and intended procedure confirmed with present staff. Received instructions for my participation in the procedure from the performing physician.  

## 2022-02-10 NOTE — Progress Notes (Signed)
[facility] History and Physical   Primary Care Physician:  Janith Lima, MD   Reason for Procedure:   History of colon polyps  Plan:    colonoscopy     HPI: Brandon Shannon is a 66 y.o. male  here for colonoscopy surveillance - polyp removed 11/2014. Patient denies any bowel symptoms at this time. No family history of colon cancer known. Otherwise feels well without any cardiopulmonary symptoms. Have discussed risks / benefits and he wants to proceed.   Past Medical History:  Diagnosis Date   AKI (acute kidney injury) ([place])    Arthritis    "hands, knees" (08/28/2016)   CAP (community acquired pneumonia) 08/28/2016   GERD (gastroesophageal reflux disease)    Hyperlipidemia    Hypertension    Pneumonia ~ 2015   Seasonal allergies    Syncope    Syncope and collapse 08/28/2016   Type II diabetes mellitus ([place])     Past Surgical History:  Procedure Laterality Date   BUNIONECTOMY WITH HAMMERTOE RECONSTRUCTION Bilateral    COLONOSCOPY  10/27/2004   normal   COLONOSCOPY WITH PROPOFOL  12/08/2014   polyps-Dr.Kaplan   POLYPECTOMY      Prior to Admission medications   Medication Sig Start Date End Date Taking? Authorizing Provider  ALPRAZolam Duanne Moron) 0.5 MG tablet Take 0.5 mg by mouth daily as needed for anxiety.   Yes [provider]  carvedilol (COREG) 12.5 MG tablet Take 1 tablet (12.5 mg total) by mouth 2 (two) times daily with a meal. 12/31/18  Yes Janith Lima, MD  gabapentin (NEURONTIN) 100 MG capsule Take 100 mg by mouth 2 (two) times daily.   Yes [provider]  losartan-hydrochlorothiazide (HYZAAR) 50-12.5 MG tablet Take 1 tablet by mouth daily. 07/23/19  Yes Janith Lima, MD  metFORMIN (GLUCOPHAGE XR) 750 MG 24 hr tablet Take 1 tablet (750 mg total) by mouth daily with breakfast. 01/31/22  Yes Janith Lima, MD  Omeprazole 20 MG TBEC TAKE 1 CAPSULE BY MOUTH IN THE MORNING AS NEEDED 01/12/20  Yes [provider]   rosuvastatin (CRESTOR) 40 MG tablet Take 40 mg by mouth daily.   Yes [provider]  tamsulosin (FLOMAX) 0.4 MG CAPS capsule Take 1 capsule (0.4 mg total) by mouth daily after supper. 09/07/16  Yes Janith Lima, MD  cetirizine (ZYRTEC) 10 MG tablet Take 1 tablet by mouth daily. 09/21/21   [provider]  Cholecalciferol 50 MCG (2000 UT) TABS Take 1 tablet (2,000 Units total) by mouth daily. 08/02/21   Janith Lima, MD  fluticasone (FLONASE) 50 MCG/ACT nasal spray INSTILL 2 SPRAYS IN EACH NOSTRIL TWICE A DAY 10/14/21   [provider]  methocarbamol (ROBAXIN) 500 MG tablet Take 1 tablet (500 mg total) by mouth every 8 (eight) hours as needed for muscle spasms. 01/14/22   Lucrezia Starch, MD  naproxen (NAPROSYN) 500 MG tablet Take 1 tablet (500 mg total) by mouth 2 (two) times daily as needed. 01/14/22   Lucrezia Starch, MD  sildenafil (VIAGRA) 100 MG tablet TAKE ONE TABLET BY MOUTH AS INSTRUCTED AS NEEDED (TAKE 1 HOUR PRIOR TO SEXUAL ACTIVITY *DO NOT EXCEED 1 DOSE PER 24 HOUR PERIOD*) 09/21/21   [provider]    Current Outpatient Medications  Medication Sig Dispense Refill   ALPRAZolam (XANAX) 0.5 MG tablet Take 0.5 mg by mouth daily as needed for anxiety.     carvedilol (COREG) 12.5 MG tablet Take 1 tablet (12.5  mg total) by mouth 2 (two) times daily with a meal. 180 tablet 1   gabapentin (NEURONTIN) 100 MG capsule Take 100 mg by mouth 2 (two) times daily.     losartan-hydrochlorothiazide (HYZAAR) 50-12.5 MG tablet Take 1 tablet by mouth daily. 90 tablet 1   metFORMIN (GLUCOPHAGE XR) 750 MG 24 hr tablet Take 1 tablet (750 mg total) by mouth daily with breakfast. 90 tablet 1   Omeprazole 20 MG TBEC TAKE 1 CAPSULE BY MOUTH IN THE MORNING AS NEEDED     rosuvastatin (CRESTOR) 40 MG tablet Take 40 mg by mouth daily.     tamsulosin (FLOMAX) 0.4 MG CAPS capsule Take 1 capsule (0.4 mg total) by mouth daily after supper. 90 capsule 1   cetirizine (ZYRTEC) 10  MG tablet Take 1 tablet by mouth daily.     Cholecalciferol 50 MCG (2000 UT) TABS Take 1 tablet (2,000 Units total) by mouth daily. 90 tablet 1   fluticasone (FLONASE) 50 MCG/ACT nasal spray INSTILL 2 SPRAYS IN EACH NOSTRIL TWICE A DAY     methocarbamol (ROBAXIN) 500 MG tablet Take 1 tablet (500 mg total) by mouth every 8 (eight) hours as needed for muscle spasms. 20 tablet 0   naproxen (NAPROSYN) 500 MG tablet Take 1 tablet (500 mg total) by mouth 2 (two) times daily as needed. 20 tablet 0   sildenafil (VIAGRA) 100 MG tablet TAKE ONE TABLET BY MOUTH AS INSTRUCTED AS NEEDED (TAKE 1 HOUR PRIOR TO SEXUAL ACTIVITY *DO NOT EXCEED 1 DOSE PER 24 HOUR PERIOD*)     Current Facility-Administered Medications  Medication Dose Route Frequency Provider Last Rate Last Admin   0.9 %  sodium chloride infusion  500 mL Intravenous Once Klarissa Mcilvain, Carlota Raspberry, MD        Allergies as of 02/10/2022 - Review Complete 02/10/2022  Allergen Reaction Noted   Atorvastatin Nausea And Vomiting 12/31/2018   Pravastatin Other (See Comments) 12/31/2018    Family History  Problem Relation Age of Onset   Diabetes Mother    Heart disease Mother    Hypertension Mother    Arthritis Mother    Hypertension Sister    Hypertension Sister    Cancer Neg Hx    Alcohol abuse Neg Hx    Drug abuse Neg Hx    Early death Neg Hx    Kidney disease Neg Hx    Stroke Neg Hx    Colon cancer Neg Hx     Social History   Socioeconomic History   Marital status: Married    Spouse name: Not on file   Number of children: Not on file   Years of education: Not on file   Highest education level: Not on file  Occupational History   Not on file  Tobacco Use   Smoking status: Never   Smokeless tobacco: Never  Vaping Use   Vaping Use: Never used  Substance and Sexual Activity   Alcohol use: Yes    Alcohol/week: 1.0 standard drink of alcohol    Types: 1 Standard drinks or equivalent per week   Drug use: No   Sexual activity: Never   Other Topics Concern   Not on file  Social History Narrative   Not on file   Social Determinants of Health   Financial Resource Strain: Not on file  Food Insecurity: Not on file  Transportation Needs: Not on file  Physical Activity: Not on file  Stress: Not on file  Social Connections: Not on file  Intimate Partner Violence: Not on file    Review of Systems: All other review of systems negative except as mentioned in the HPI.  Physical Exam: Vital signs BP (!) 122/58   Pulse 64   Temp 98.7 F (37.1 C)   Ht '5\' 11"'$  (1.803 m)   Wt 222 lb (100.7 kg)   SpO2 98%   BMI 30.96 kg/m   General:   Alert,  Well-developed, pleasant and cooperative in NAD Lungs:  Clear throughout to auscultation.   Heart:  Regular rate and rhythm Abdomen:  Soft, nontender and nondistended.   Neuro/Psych:  Alert and cooperative. Normal mood and affect. A and O x 3  Jolly Mango, MD [facility]

## 2022-02-10 NOTE — Progress Notes (Signed)
Sedate, gd SR, tolerated procedure well, VSS, report to RN 

## 2022-02-10 NOTE — Patient Instructions (Signed)
  HANDOUTS ON DIVERTICULOSIS, HEMORRHOIDS AND POLYPS GIVEN.    YOU HAD AN ENDOSCOPIC PROCEDURE TODAY AT [facility]:   Refer to the procedure report that was given to you for any specific questions about what was found during the examination.  If the procedure report does not answer your questions, please call your gastroenterologist to clarify.  If you requested that your care partner not be given the details of your procedure findings, then the procedure report has been included in a sealed envelope for you to review at your convenience later.  YOU SHOULD EXPECT: Some feelings of bloating in the abdomen. Passage of more gas than usual.  Walking can help get rid of the air that was put into your GI tract during the procedure and reduce the bloating. If you had a lower endoscopy (such as a colonoscopy or flexible sigmoidoscopy) you may notice spotting of blood in your stool or on the toilet paper. If you underwent a bowel prep for your procedure, you may not have a normal bowel movement for a few days.  Please Note:  You might notice some irritation and congestion in your nose or some drainage.  This is from the oxygen used during your procedure.  There is no need for concern and it should clear up in a day or so.  SYMPTOMS TO REPORT IMMEDIATELY:  Following lower endoscopy (colonoscopy or flexible sigmoidoscopy):  Excessive amounts of blood in the stool  Significant tenderness or worsening of abdominal pains  Swelling of the abdomen that is new, acute  Fever of 100F or higher   For urgent or emergent issues, a gastroenterologist can be reached at any hour by calling [phone]. Do not use MyChart messaging for urgent concerns.    DIET:  We do recommend a small meal at first, but then you may proceed to your regular diet.  Drink plenty of fluids but you should avoid alcoholic beverages for 24 hours.  ACTIVITY:  You should plan to take it easy for the rest of today and you  should NOT DRIVE or use heavy machinery until tomorrow (because of the sedation medicines used during the test).    FOLLOW UP: Our staff will call the number listed on your records 24-72 hours following your procedure to check on you and address any questions or concerns that you may have regarding the information given to you following your procedure. If we do not reach you, we will leave a message.  We will attempt to reach you two times.  During this call, we will ask if you have developed any symptoms of COVID 19. If you develop any symptoms (ie: fever, flu-like symptoms, shortness of breath, cough etc.) before then, please call [phone].  If you test positive for Covid 19 in the 2 weeks post procedure, please call and report this information to [place].    If any biopsies were taken you will be contacted by phone or by letter within the next 1-3 weeks.  Please call us at [phone] if you have not heard about the biopsies in 3 weeks.    SIGNATURES/CONFIDENTIALITY: You and/or your care partner have signed paperwork which will be entered into your electronic medical record.  These signatures attest to the fact that that the information above on your After Visit Summary has been reviewed and is understood.  Full responsibility of the confidentiality of this discharge information lies with you and/or your care-partner.

## 2022-02-13 ENCOUNTER — Telehealth: Payer: Self-pay

## 2022-02-13 NOTE — Telephone Encounter (Signed)
  Follow up Call-     02/10/2022   10:40 AM  Call back number  Post procedure Call Back phone  # [phone]  Permission to leave phone message Yes     Patient questions:  Do you have a fever, pain , or abdominal swelling? No. Pain Score  0 *  Have you tolerated food without any problems? Yes.    Have you been able to return to your normal activities? Yes.    Do you have any questions about your discharge instructions: Diet   No. Medications  No. Follow up visit  No.  Do you have questions or concerns about your Care? No.  Actions: * If pain score is 4 or above: No action needed, pain <4.

## 2022-05-25 ENCOUNTER — Ambulatory Visit (INDEPENDENT_AMBULATORY_CARE_PROVIDER_SITE_OTHER): Payer: Medicare Other | Admitting: Emergency Medicine

## 2022-05-25 ENCOUNTER — Ambulatory Visit (INDEPENDENT_AMBULATORY_CARE_PROVIDER_SITE_OTHER): Payer: Medicare Other

## 2022-05-25 ENCOUNTER — Encounter: Payer: Self-pay | Admitting: Emergency Medicine

## 2022-05-25 VITALS — BP 140/78 | HR 73 | Temp 98.4°F | Ht 71.0 in | Wt 229.5 lb

## 2022-05-25 DIAGNOSIS — M25552 Pain in left hip: Secondary | ICD-10-CM | POA: Insufficient documentation

## 2022-05-25 DIAGNOSIS — R1032 Left lower quadrant pain: Secondary | ICD-10-CM

## 2022-05-25 MED ORDER — TRAMADOL HCL 50 MG PO TABS: 50.0000 mg | ORAL_TABLET | Freq: Three times a day (TID) | ORAL | 0 refills | Status: AC | PRN

## 2022-05-25 MED ORDER — TRAMADOL HCL 50 MG PO TABS: 50.0000 mg | ORAL_TABLET | Freq: Three times a day (TID) | ORAL | 0 refills | Status: DC | PRN

## 2022-05-25 NOTE — Patient Instructions (Signed)
Hip Pain The hip is the joint between the upper legs and the lower pelvis. The bones, cartilage, tendons, and muscles of your hip joint support your body and allow you to move around. Hip pain can range from a minor ache to severe pain in one or both of your hips. The pain may be felt on the inside of the hip joint near the groin, or on the outside near the buttocks and upper thigh. You may also have swelling or stiffness in your hip area. Follow these instructions at home: Managing pain, stiffness, and swelling     If directed, put ice on the painful area. To do this: Put ice in a plastic bag. Place a towel between your skin and the bag. Leave the ice on for 20 minutes, 2-3 times a day. If directed, apply heat to the affected area as often as told by your health care provider. Use the heat source that your health care provider recommends, such as a moist heat pack or a heating pad. Place a towel between your skin and the heat source. Leave the heat on for 20-30 minutes. Remove the heat if your skin turns bright red. This is especially important if you are unable to feel pain, heat, or cold. You may have a greater risk of getting burned. Activity Do exercises as told by your health care provider. Avoid activities that cause pain. General instructions  Take over-the-counter and prescription medicines only as told by your health care provider. Keep a journal of your symptoms. Write down: How often you have hip pain. The location of your pain. What the pain feels like. What makes the pain worse. Sleep with a pillow between your legs on your most comfortable side. Keep all follow-up visits as told by your health care provider. This is important. Contact a health care provider if: You cannot put weight on your leg. Your pain or swelling continues or gets worse after one week. It gets harder to walk. You have a fever. Get help right away if: You fall. You have a sudden increase in pain  and swelling in your hip. Your hip is red or swollen or very tender to touch. Summary Hip pain can range from a minor ache to severe pain in one or both of your hips. The pain may be felt on the inside of the hip joint near the groin, or on the outside near the buttocks and upper thigh. Avoid activities that cause pain. Write down how often you have hip pain, the location of the pain, what makes it worse, and what it feels like. This information is not intended to replace advice given to you by your health care provider. Make sure you discuss any questions you have with your health care provider. Document Revised: 12/30/2018 Document Reviewed: 12/30/2018 Elsevier Patient Education  2023 [employer]  

## 2022-05-25 NOTE — Progress Notes (Signed)
Brandon Shannon 66 y.o.   Chief Complaint  Patient presents with   Groin Pain    Patient had pain in groin area for a few months, pain is worse with movement     HISTORY OF PRESENT ILLNESS: Acute problem visit today.  Patient of Dr. Scarlette Calico. This is a 66 y.o. male complaining of pain to left hip and groin area for the past couple of months. Waxing and waning.  No radiation or associated symptoms.  Denies injury. No other complaints or medical concerns.  Groin Pain Pertinent negatives include no abdominal pain, chills, coughing, dysuria, fever, headaches, nausea, rash, shortness of breath, sore throat or vomiting.     Prior to Admission medications   Medication Sig Start Date End Date Taking? Authorizing Provider  ALPRAZolam Duanne Moron) 0.5 MG tablet Take 0.5 mg by mouth daily as needed for anxiety.   Yes [provider]  carvedilol (COREG) 12.5 MG tablet Take 1 tablet (12.5 mg total) by mouth 2 (two) times daily with a meal. 12/31/18  Yes Janith Lima, MD  cetirizine (ZYRTEC) 10 MG tablet Take 1 tablet by mouth daily. 09/21/21  Yes [provider]  Cholecalciferol 50 MCG (2000 UT) TABS Take 1 tablet (2,000 Units total) by mouth daily. 08/02/21  Yes Janith Lima, MD  fluticasone (FLONASE) 50 MCG/ACT nasal spray INSTILL 2 SPRAYS IN EACH NOSTRIL TWICE A DAY 10/14/21  Yes [provider]  gabapentin (NEURONTIN) 100 MG capsule Take 100 mg by mouth 2 (two) times daily.   Yes [provider]  losartan-hydrochlorothiazide (HYZAAR) 50-12.5 MG tablet Take 1 tablet by mouth daily. 07/23/19  Yes Janith Lima, MD  metFORMIN (GLUCOPHAGE XR) 750 MG 24 hr tablet Take 1 tablet (750 mg total) by mouth daily with breakfast. 01/31/22  Yes Janith Lima, MD  Omeprazole 20 MG TBEC TAKE 1 CAPSULE BY MOUTH IN THE MORNING AS NEEDED 01/12/20  Yes [provider]  rosuvastatin (CRESTOR) 40 MG tablet Take 40 mg by mouth daily.   Yes [provider]   sildenafil (VIAGRA) 100 MG tablet TAKE ONE TABLET BY MOUTH AS INSTRUCTED AS NEEDED (TAKE 1 HOUR PRIOR TO SEXUAL ACTIVITY *DO NOT EXCEED 1 DOSE PER 24 HOUR PERIOD*) 09/21/21  Yes [provider]  tamsulosin (FLOMAX) 0.4 MG CAPS capsule Take 1 capsule (0.4 mg total) by mouth daily after supper. 09/07/16  Yes Janith Lima, MD    Allergies  Allergen Reactions   Atorvastatin Nausea And Vomiting   Pravastatin Other (See Comments)    Muscle pain    Patient Active Problem List   Diagnosis Date Noted   DOE (dyspnea on exertion) 01/31/2022   Abnormal electrocardiogram (ECG) (EKG) 01/31/2022   Keloid 01/31/2022   Polyp of colon 08/05/2021   DDD (degenerative disc disease), lumbar 08/04/2021   Chronic right-sided low back pain without sciatica 08/02/2021   Irritable bowel syndrome with both constipation and diarrhea 07/18/2018   Gastroesophageal reflux disease without esophagitis 07/18/2018   BPH with obstruction/lower urinary tract symptoms 09/07/2016   Screening for colon cancer 09/17/2014   GAD (generalized anxiety disorder) 03/04/2014   Vitamin D deficiency 04/02/2013   Type II diabetes mellitus with manifestations ([place]) 09/18/2007   Hyperlipidemia with target LDL less than 100 04/01/2007   Obesity 04/01/2007   Essential hypertension 04/01/2007    Past Medical History:  Diagnosis Date   AKI (acute kidney injury) ([place])    Arthritis    "hands, knees" (08/28/2016)   CAP (  community acquired pneumonia) 08/28/2016   GERD (gastroesophageal reflux disease)    Hyperlipidemia    Hypertension    Pneumonia ~ 2015   Seasonal allergies    Syncope    Syncope and collapse 08/28/2016   Type II diabetes mellitus ([facility])     Past Surgical History:  Procedure Laterality Date   BUNIONECTOMY WITH HAMMERTOE RECONSTRUCTION Bilateral    COLONOSCOPY  10/27/2004   normal   COLONOSCOPY WITH PROPOFOL  12/08/2014   polyps-Dr.Kaplan   POLYPECTOMY      Social History   Socioeconomic  History   Marital status: Married    Spouse name: Not on file   Number of children: Not on file   Years of education: Not on file   Highest education level: Not on file  Occupational History   Not on file  Tobacco Use   Smoking status: Never   Smokeless tobacco: Never  Vaping Use   Vaping Use: Never used  Substance and Sexual Activity   Alcohol use: Yes    Alcohol/week: 1.0 standard drink of alcohol    Types: 1 Standard drinks or equivalent per week   Drug use: No   Sexual activity: Never  Other Topics Concern   Not on file  Social History Narrative   Not on file   Social Determinants of Health   Financial Resource Strain: Not on file  Food Insecurity: Not on file  Transportation Needs: Not on file  Physical Activity: Not on file  Stress: Not on file  Social Connections: Not on file  Intimate Partner Violence: Not on file    Family History  Problem Relation Age of Onset   Diabetes Mother    Heart disease Mother    Hypertension Mother    Arthritis Mother    Hypertension Sister    Hypertension Sister    Cancer Neg Hx    Alcohol abuse Neg Hx    Drug abuse Neg Hx    Early death Neg Hx    Kidney disease Neg Hx    Stroke Neg Hx    Colon cancer Neg Hx      Review of Systems  Constitutional: Negative.  Negative for chills and fever.  HENT: Negative.  Negative for congestion and sore throat.   Respiratory: Negative.  Negative for cough and shortness of breath.   Cardiovascular: Negative.  Negative for palpitations.  Gastrointestinal:  Negative for abdominal pain, blood in stool, melena, nausea and vomiting.  Genitourinary: Negative.  Negative for dysuria and hematuria.  Skin: Negative.  Negative for rash.  Neurological:  Negative for dizziness and headaches.  All other systems reviewed and are negative.  Today's Vitals   05/25/22 1348  BP: (!) 140/78  Pulse: 73  Temp: 98.4 F (36.9 C)  TempSrc: Oral  SpO2: 98%  Weight: 229 lb 8 oz (104.1 kg)  Height: 5'  11" (1.803 m)   Body mass index is 32.01 kg/m.   Physical Exam Vitals reviewed.  Constitutional:      Appearance: Normal appearance.  HENT:     Head: Normocephalic.  Eyes:     Extraocular Movements: Extraocular movements intact.     Pupils: Pupils are equal, round, and reactive to light.  Cardiovascular:     Rate and Rhythm: Normal rate.  Pulmonary:     Effort: Pulmonary effort is normal.  Abdominal:     Palpations: Abdomen is soft.     Tenderness: There is no abdominal tenderness.     Hernia: There is  no hernia in the left inguinal area or right inguinal area.  Genitourinary:    Testes: Normal.     Epididymis:     Right: Normal.     Left: Normal.  Musculoskeletal:     Comments: Left hip: Mild tenderness, painful range of motion Lumbar spine: No spine tenderness  Lymphadenopathy:     Lower Body: No right inguinal adenopathy. No left inguinal adenopathy.  Skin:    General: Skin is warm and dry.  Neurological:     General: No focal deficit present.     Mental Status: He is alert and oriented to person, place, and time.  Psychiatric:        Mood and Affect: Mood normal.        Behavior: Behavior normal.    DG HIP UNILAT W OR W/O PELVIS 2-3 VIEWS LEFT  Result Date: 05/25/2022 CLINICAL DATA:  Left hip and groin pain for 2 months. EXAM: DG HIP (WITH OR WITHOUT PELVIS) 2-3V LEFT COMPARISON:  CT scan 01/14/2022 FINDINGS: Both hips are normally located. Mild bilateral hip joint degenerative changes. No acute hip fracture or plain film evidence of AVN. The pubic symphysis and SI joints are intact. No pelvic fractures or pelvic bone lesions. Degenerative changes noted in the lower lumbar spine. IMPRESSION: 1. Mild bilateral hip joint degenerative changes. 2. No acute bony findings or plain film evidence of AVN. Electronically Signed   By: Marijo Sanes M.D.   On: 05/25/2022 14:32     ASSESSMENT & PLAN: A total of 45 minutes was spent with the patient and counseling/coordination  of care regarding preparing for this visit, review of most recent office visit notes, review of multiple chronic medical problems and their management, review of all medications, review of today's x-ray report, differential diagnosis of left hip pain and need for orthopedic evaluation, pain management, prognosis, documentation, and need for follow-up.  Problem List Items Addressed This Visit       Other   Left hip pain - Primary    Degenerative changes on x-ray. Advised to take Tylenol for mild to moderate pain and tramadol for moderate to severe pain. Recommend orthopedic evaluation. Sports medicine referral placed today.      Relevant Medications   traMADol (ULTRAM) 50 MG tablet   Other Relevant Orders   DG HIP UNILAT W OR W/O PELVIS 2-3 VIEWS LEFT (Completed)   Ambulatory referral to Sports Medicine   Left groin pain    Most likely referred pain from left hip. Chronic and affecting quality of life. Needs orthopedic evaluation.      Relevant Medications   traMADol (ULTRAM) 50 MG tablet   Other Relevant Orders   DG HIP UNILAT W OR W/O PELVIS 2-3 VIEWS LEFT (Completed)   Ambulatory referral to Sports Medicine   Patient Instructions  Hip Pain The hip is the joint between the upper legs and the lower pelvis. The bones, cartilage, tendons, and muscles of your hip joint support your body and allow you to move around. Hip pain can range from a minor ache to severe pain in one or both of your hips. The pain may be felt on the inside of the hip joint near the groin, or on the outside near the buttocks and upper thigh. You may also have swelling or stiffness in your hip area. Follow these instructions at home: Managing pain, stiffness, and swelling     If directed, put ice on the painful area. To do this: Put ice in a  plastic bag. Place a towel between your skin and the bag. Leave the ice on for 20 minutes, 2-3 times a day. If directed, apply heat to the affected area as often as  told by your health care provider. Use the heat source that your health care provider recommends, such as a moist heat pack or a heating pad. Place a towel between your skin and the heat source. Leave the heat on for 20-30 minutes. Remove the heat if your skin turns bright red. This is especially important if you are unable to feel pain, heat, or cold. You may have a greater risk of getting burned. Activity Do exercises as told by your health care provider. Avoid activities that cause pain. General instructions  Take over-the-counter and prescription medicines only as told by your health care provider. Keep a journal of your symptoms. Write down: How often you have hip pain. The location of your pain. What the pain feels like. What makes the pain worse. Sleep with a pillow between your legs on your most comfortable side. Keep all follow-up visits as told by your health care provider. This is important. Contact a health care provider if: You cannot put weight on your leg. Your pain or swelling continues or gets worse after one week. It gets harder to walk. You have a fever. Get help right away if: You fall. You have a sudden increase in pain and swelling in your hip. Your hip is red or swollen or very tender to touch. Summary Hip pain can range from a minor ache to severe pain in one or both of your hips. The pain may be felt on the inside of the hip joint near the groin, or on the outside near the buttocks and upper thigh. Avoid activities that cause pain. Write down how often you have hip pain, the location of the pain, what makes it worse, and what it feels like. This information is not intended to replace advice given to you by your health care provider. Make sure you discuss any questions you have with your health care provider. Document Revised: 12/30/2018 Document Reviewed: 12/30/2018 Elsevier Patient Education  [place], MD [facility]

## 2022-05-25 NOTE — Assessment & Plan Note (Signed)
Most likely referred pain from left hip. Chronic and affecting quality of life. Needs orthopedic evaluation.

## 2022-05-25 NOTE — Assessment & Plan Note (Signed)
Degenerative changes on x-ray. Advised to take Tylenol for mild to moderate pain and tramadol for moderate to severe pain. Recommend orthopedic evaluation. Sports medicine referral placed today.

## 2022-05-29 NOTE — Progress Notes (Signed)
Benito Mccreedy D.[facility] [place] Phone: [phone]   Assessment and Plan:     1. Chronic left hip pain 2. Primary osteoarthritis of left hip -Chronic with exacerbation, initial sports medicine visit - Consistent with flare of osteoarthritis of left hip based on HPI, physical exam, x-ray - Reviewed x-ray with patient in room.  My interpretation: No acute fracture or dislocation.  Bilaterally decreased hip joint space with acetabular spurring.  Femoral head spurring on left. - Start meloxicam 15 mg daily x2 weeks.  If still having pain after 2 weeks, complete 3rd-week of meloxicam. May use remaining meloxicam as needed once daily for pain control.  Do not to use additional NSAIDs while taking meloxicam.  May use Tylenol [phone] mg 2 to 3 times a day for breakthrough pain. -Start HEP for hip  Other orders - meloxicam (MOBIC) 15 MG tablet; Take 1 tablet (15 mg total) by mouth daily.    Pertinent previous records reviewed include internal medicine note 05/25/2022, Left hip x-ray 05/25/2022  Follow Up: 3 weeks for reevaluation.  If no improvement or worsening of symptoms, would consider intra-articular hip CSI versus physical therapy   Subjective:   I, Moenique Parris, am serving as a Education administrator for Doctor Glennon Mac  Chief Complaint: left hip pain   HPI:   05/30/2022 Patient is a 66 year old male complaining of left hip pain. Patient states pain to left hip and groin area for the past couple of months. Medication is helping, any movement was painful, no MOI, has degenerative stuff in his hips , pain in  the morning or at night he has to walk gingerly , once he gets going during he day he is fine , he has pain when he makes dramatic moves , at one time he had groin pain   Relevant Historical Information: DM type II, hypertension,  Additional pertinent review of systems negative.   Current Outpatient Medications:     meloxicam (MOBIC) 15 MG tablet, Take 1 tablet (15 mg total) by mouth daily., Disp: 30 tablet, Rfl: 0   ALPRAZolam (XANAX) 0.5 MG tablet, Take 0.5 mg by mouth daily as needed for anxiety., Disp: , Rfl:    carvedilol (COREG) 12.5 MG tablet, Take 1 tablet (12.5 mg total) by mouth 2 (two) times daily with a meal., Disp: 180 tablet, Rfl: 1   cetirizine (ZYRTEC) 10 MG tablet, Take 1 tablet by mouth daily., Disp: , Rfl:    Cholecalciferol 50 MCG (2000 UT) TABS, Take 1 tablet (2,000 Units total) by mouth daily., Disp: 90 tablet, Rfl: 1   fluticasone (FLONASE) 50 MCG/ACT nasal spray, INSTILL 2 SPRAYS IN EACH NOSTRIL TWICE A DAY, Disp: , Rfl:    gabapentin (NEURONTIN) 100 MG capsule, Take 100 mg by mouth 2 (two) times daily., Disp: , Rfl:    losartan-hydrochlorothiazide (HYZAAR) 50-12.5 MG tablet, Take 1 tablet by mouth daily., Disp: 90 tablet, Rfl: 1   metFORMIN (GLUCOPHAGE XR) 750 MG 24 hr tablet, Take 1 tablet (750 mg total) by mouth daily with breakfast., Disp: 90 tablet, Rfl: 1   Omeprazole 20 MG TBEC, TAKE 1 CAPSULE BY MOUTH IN THE MORNING AS NEEDED, Disp: , Rfl:    rosuvastatin (CRESTOR) 40 MG tablet, Take 40 mg by mouth daily., Disp: , Rfl:    sildenafil (VIAGRA) 100 MG tablet, TAKE ONE TABLET BY MOUTH AS INSTRUCTED AS NEEDED (TAKE 1 HOUR PRIOR TO SEXUAL ACTIVITY *DO NOT EXCEED 1  DOSE PER 24 HOUR PERIOD*), Disp: , Rfl:    tamsulosin (FLOMAX) 0.4 MG CAPS capsule, Take 1 capsule (0.4 mg total) by mouth daily after supper., Disp: 90 capsule, Rfl: 1   traMADol (ULTRAM) 50 MG tablet, Take 1 tablet (50 mg total) by mouth every 8 (eight) hours as needed for up to 5 days., Disp: 15 tablet, Rfl: 0   Objective:     Vitals:   05/30/22 1419  BP: 120/68  Pulse: 94  SpO2: 94%  Weight: 229 lb (103.9 kg)  Height: '5\' 11"'$  (1.803 m)      Body mass index is 31.94 kg/m.    Physical Exam:    General: awake, alert, and oriented no acute distress, nontoxic Skin: no suspicious lesions or  rashes Neuro:sensation intact distally with no dificits, normal muscle tone, no atrophy, strength 5/5 in all tested lower ext groups Psych: normal mood and affect, speech clear  Left hip: No deformity, swelling or wasting ROM Flexion 75, ext 20, IR 30, ER 35 NTTP over the hip flexors, greater troch, gluteal musculature, si joint, lumbar spine Negative log roll with FROM Positive FABER Positive FADIR   Negative trendelenberg Gait normal    Electronically signed by:  Benito Mccreedy D.[facility] 2:40 PM 05/30/22

## 2022-05-30 ENCOUNTER — Ambulatory Visit (INDEPENDENT_AMBULATORY_CARE_PROVIDER_SITE_OTHER): Payer: Medicare Other | Admitting: Sports Medicine

## 2022-05-30 VITALS — BP 120/68 | HR 94 | Ht 71.0 in | Wt 229.0 lb

## 2022-05-30 DIAGNOSIS — M1612 Unilateral primary osteoarthritis, left hip: Secondary | ICD-10-CM | POA: Diagnosis not present

## 2022-05-30 DIAGNOSIS — M25552 Pain in left hip: Secondary | ICD-10-CM | POA: Diagnosis not present

## 2022-05-30 DIAGNOSIS — G8929 Other chronic pain: Secondary | ICD-10-CM | POA: Diagnosis not present

## 2022-05-30 MED ORDER — MELOXICAM 15 MG PO TABS: 15.0000 mg | ORAL_TABLET | Freq: Every day | ORAL | 0 refills | Status: DC

## 2022-05-30 NOTE — Patient Instructions (Addendum)
Good to see you - Start meloxicam 15 mg daily x2 weeks.  If still having pain after 2 weeks, complete 3rd-week of meloxicam. May use remaining meloxicam as needed once daily for pain control.  Do not to use additional NSAIDs while taking meloxicam.  May use Tylenol [phone] mg 2 to 3 times a day for breakthrough pain. Hip HEP  3 week follow up

## 2022-06-20 NOTE — Progress Notes (Unsigned)
    Benito Mccreedy D.[facility] [place] Phone: [phone]   Assessment and Plan:     There are no diagnoses linked to this encounter.  ***   Pertinent previous records reviewed include ***   Follow Up: ***     Subjective:   I, Jaydah Stahle, am serving as a Education administrator for Doctor Glennon Mac   Chief Complaint: left hip pain    HPI:    05/30/2022 Patient is a 66 year old male complaining of left hip pain. Patient states pain to left hip and groin area for the past couple of months. Medication is helping, any movement was painful, no MOI, has degenerative stuff in his hips , pain in  the morning or at night he has to walk gingerly , once he gets going during he day he is fine , he has pain when he makes dramatic moves , at one time he had groin pain   06/21/2022 Patient states   Relevant Historical Information: DM type II, hypertension, Additional pertinent review of systems negative.   Current Outpatient Medications:    ALPRAZolam (XANAX) 0.5 MG tablet, Take 0.5 mg by mouth daily as needed for anxiety., Disp: , Rfl:    carvedilol (COREG) 12.5 MG tablet, Take 1 tablet (12.5 mg total) by mouth 2 (two) times daily with a meal., Disp: 180 tablet, Rfl: 1   cetirizine (ZYRTEC) 10 MG tablet, Take 1 tablet by mouth daily., Disp: , Rfl:    Cholecalciferol 50 MCG (2000 UT) TABS, Take 1 tablet (2,000 Units total) by mouth daily., Disp: 90 tablet, Rfl: 1   fluticasone (FLONASE) 50 MCG/ACT nasal spray, INSTILL 2 SPRAYS IN EACH NOSTRIL TWICE A DAY, Disp: , Rfl:    gabapentin (NEURONTIN) 100 MG capsule, Take 100 mg by mouth 2 (two) times daily., Disp: , Rfl:    losartan-hydrochlorothiazide (HYZAAR) 50-12.5 MG tablet, Take 1 tablet by mouth daily., Disp: 90 tablet, Rfl: 1   meloxicam (MOBIC) 15 MG tablet, Take 1 tablet (15 mg total) by mouth daily., Disp: 30 tablet, Rfl: 0   metFORMIN (GLUCOPHAGE XR) 750 MG 24 hr tablet, Take  1 tablet (750 mg total) by mouth daily with breakfast., Disp: 90 tablet, Rfl: 1   Omeprazole 20 MG TBEC, TAKE 1 CAPSULE BY MOUTH IN THE MORNING AS NEEDED, Disp: , Rfl:    rosuvastatin (CRESTOR) 40 MG tablet, Take 40 mg by mouth daily., Disp: , Rfl:    sildenafil (VIAGRA) 100 MG tablet, TAKE ONE TABLET BY MOUTH AS INSTRUCTED AS NEEDED (TAKE 1 HOUR PRIOR TO SEXUAL ACTIVITY *DO NOT EXCEED 1 DOSE PER 24 HOUR PERIOD*), Disp: , Rfl:    tamsulosin (FLOMAX) 0.4 MG CAPS capsule, Take 1 capsule (0.4 mg total) by mouth daily after supper., Disp: 90 capsule, Rfl: 1   Objective:     There were no vitals filed for this visit.    There is no height or weight on file to calculate BMI.    Physical Exam:    ***   Electronically signed by:  Benito Mccreedy D.[facility] 8:33 AM 06/20/22

## 2022-06-21 ENCOUNTER — Ambulatory Visit: Payer: Medicare Other | Admitting: Sports Medicine

## 2022-06-21 ENCOUNTER — Ambulatory Visit (INDEPENDENT_AMBULATORY_CARE_PROVIDER_SITE_OTHER): Payer: Medicare Other | Admitting: Sports Medicine

## 2022-06-21 VITALS — BP 122/78 | HR 78 | Ht 71.0 in | Wt 227.0 lb

## 2022-06-21 DIAGNOSIS — M25552 Pain in left hip: Secondary | ICD-10-CM | POA: Diagnosis not present

## 2022-06-21 DIAGNOSIS — M1612 Unilateral primary osteoarthritis, left hip: Secondary | ICD-10-CM

## 2022-06-21 DIAGNOSIS — G8929 Other chronic pain: Secondary | ICD-10-CM | POA: Diagnosis not present

## 2022-06-21 NOTE — Patient Instructions (Addendum)
Good to see you  Discontinue meloxicam may use remainder as needed ,no more than 1-2 tines per week Tylenol [phone] mg 2-3 times a day for pain relief  Continue HEP  Follow up as needed

## 2022-07-19 ENCOUNTER — Telehealth: Payer: Self-pay | Admitting: Internal Medicine

## 2022-07-19 NOTE — Telephone Encounter (Signed)
LVM for pt to rtn my call to schedule AWV-I with [facility] call back # [phone] 

## 2022-07-28 NOTE — Patient Instructions (Signed)
Health Maintenance, Male Adopting a healthy lifestyle and getting preventive care are important in promoting health and wellness. Ask your health care provider about: The right schedule for you to have regular tests and exams. Things you can do on your own to prevent diseases and keep yourself healthy. What should I know about diet, weight, and exercise? Eat a healthy diet  Eat a diet that includes plenty of vegetables, fruits, low-fat dairy products, and lean protein. Do not eat a lot of foods that are high in solid fats, added sugars, or sodium. Maintain a healthy weight Body mass index (BMI) is a measurement that can be used to identify possible weight problems. It estimates body fat based on height and weight. Your health care provider can help determine your BMI and help you achieve or maintain a healthy weight. Get regular exercise Get regular exercise. This is one of the most important things you can do for your health. Most adults should: Exercise for at least 150 minutes each week. The exercise should increase your heart rate and make you sweat (moderate-intensity exercise). Do strengthening exercises at least twice a week. This is in addition to the moderate-intensity exercise. Spend less time sitting. Even light physical activity can be beneficial. Watch cholesterol and blood lipids Have your blood tested for lipids and cholesterol at 68 years of age, then have this test every 5 years. You may need to have your cholesterol levels checked more often if: Your lipid or cholesterol levels are high. You are older than 68 years of age. You are at high risk for heart disease. What should I know about cancer screening? Many types of cancers can be detected early and may often be prevented. Depending on your health history and family history, you may need to have cancer screening at various ages. This may include screening for: Colorectal cancer. Prostate cancer. Skin cancer. Lung  cancer. What should I know about heart disease, diabetes, and high blood pressure? Blood pressure and heart disease High blood pressure causes heart disease and increases the risk of stroke. This is more likely to develop in people who have high blood pressure readings or are overweight. Talk with your health care provider about your target blood pressure readings. Have your blood pressure checked: Every 3-5 years if you are 18-39 years of age. Every year if you are 40 years old or older. If you are between the ages of 65 and 75 and are a current or former smoker, ask your health care provider if you should have a one-time screening for abdominal aortic aneurysm (AAA). Diabetes Have regular diabetes screenings. This checks your fasting blood sugar level. Have the screening done: Once every three years after age 45 if you are at a normal weight and have a low risk for diabetes. More often and at a younger age if you are overweight or have a high risk for diabetes. What should I know about preventing infection? Hepatitis B If you have a higher risk for hepatitis B, you should be screened for this virus. Talk with your health care provider to find out if you are at risk for hepatitis B infection. Hepatitis C Blood testing is recommended for: Everyone born from 1945 through 1965. Anyone with known risk factors for hepatitis C. Sexually transmitted infections (STIs) You should be screened each year for STIs, including gonorrhea and chlamydia, if: You are sexually active and are younger than 68 years of age. You are older than 68 years of age and your   health care provider tells you that you are at risk for this type of infection. Your sexual activity has changed since you were last screened, and you are at increased risk for chlamydia or gonorrhea. Ask your health care provider if you are at risk. Ask your health care provider about whether you are at high risk for HIV. Your health care provider  may recommend a prescription medicine to help prevent HIV infection. If you choose to take medicine to prevent HIV, you should first get tested for HIV. You should then be tested every 3 months for as long as you are taking the medicine. Follow these instructions at home: Alcohol use Do not drink alcohol if your health care provider tells you not to drink. If you drink alcohol: Limit how much you have to 0-2 drinks a day. Know how much alcohol is in your drink. In the [place] one drink equals one 12 oz bottle of beer (355 mL), one 5 oz glass of Yanet Balliet (148 mL), or one 1 oz glass of hard liquor (44 mL). Lifestyle Do not use any products that contain nicotine or tobacco. These products include cigarettes, chewing tobacco, and vaping devices, such as e-cigarettes. If you need help quitting, ask your health care provider. Do not use street drugs. Do not share needles. Ask your health care provider for help if you need support or information about quitting drugs. General instructions Schedule regular health, dental, and eye exams. Stay current with your vaccines. Tell your health care provider if: You often feel depressed. You have ever been abused or do not feel safe at home. Summary Adopting a healthy lifestyle and getting preventive care are important in promoting health and wellness. Follow your health care provider's instructions about healthy diet, exercising, and getting tested or screened for diseases. Follow your health care provider's instructions on monitoring your cholesterol and blood pressure. This information is not intended to replace advice given to you by your health care provider. Make sure you discuss any questions you have with your health care provider. Document Revised: 01/03/2021 Document Reviewed: 01/03/2021 Elsevier Patient Education  2023 Elsevier Inc.  

## 2022-07-28 NOTE — Progress Notes (Unsigned)
Subjective:   Brandon Shannon is a 66 y.o. male who presents for an Initial Medicare Annual Wellness Visit. I connected with  Marjory Lies on 07/31/22 by a audio enabled telemedicine application and verified that I am speaking with the correct person using two identifiers.  Patient Location: Home  Provider Location: Home Office  I discussed the limitations of evaluation and management by telemedicine. The patient expressed understanding and agreed to proceed.  Review of Systems    Deferred to PCP Cardiac Risk Factors include: advanced age (>12mn, >>76women);diabetes mellitus;dyslipidemia;male gender;hypertension     Objective:    Today's Vitals   07/31/22 1048  PainSc: 2    There is no height or weight on file to calculate BMI.     07/31/2022   10:52 AM 01/14/2022    9:36 AM 07/06/2019    3:18 AM 06/02/2017    9:06 AM 08/28/2016    3:31 AM 12/08/2014    7:50 AM 11/24/2014    8:04 AM  Advanced Directives  Does Patient Have a Medical Advance Directive? No No No No No No No  Would patient like information on creating a medical advance directive? No - Patient declined No - Patient declined No - Patient declined  No - Patient declined      Current Medications (verified) Outpatient Encounter Medications as of 07/31/2022  Medication Sig   ALPRAZolam (XANAX) 0.5 MG tablet Take 0.5 mg by mouth daily as needed for anxiety.   carvedilol (COREG) 12.5 MG tablet Take 1 tablet (12.5 mg total) by mouth 2 (two) times daily with a meal.   cetirizine (ZYRTEC) 10 MG tablet Take 1 tablet by mouth daily.   Cholecalciferol 50 MCG (2000 UT) TABS Take 1 tablet (2,000 Units total) by mouth daily.   fluticasone (FLONASE) 50 MCG/ACT nasal spray INSTILL 2 SPRAYS IN EACH NOSTRIL TWICE A DAY   gabapentin (NEURONTIN) 100 MG capsule Take 100 mg by mouth 2 (two) times daily.   losartan-hydrochlorothiazide (HYZAAR) 50-12.5 MG tablet Take 1 tablet by mouth daily.   meloxicam (MOBIC) 15 MG tablet Take 1  tablet (15 mg total) by mouth daily.   metFORMIN (GLUCOPHAGE XR) 750 MG 24 hr tablet Take 1 tablet (750 mg total) by mouth daily with breakfast.   Omeprazole 20 MG TBEC TAKE 1 CAPSULE BY MOUTH IN THE MORNING AS NEEDED   rosuvastatin (CRESTOR) 40 MG tablet Take 40 mg by mouth daily.   sildenafil (VIAGRA) 100 MG tablet TAKE ONE TABLET BY MOUTH AS INSTRUCTED AS NEEDED (TAKE 1 HOUR PRIOR TO SEXUAL ACTIVITY *DO NOT EXCEED 1 DOSE PER 24 HOUR PERIOD*)   tamsulosin (FLOMAX) 0.4 MG CAPS capsule Take 1 capsule (0.4 mg total) by mouth daily after supper.   No facility-administered encounter medications on file as of 07/31/2022.    Allergies (verified) Atorvastatin and Pravastatin   History: Past Medical History:  Diagnosis Date   AKI (acute kidney injury) ([facility]    Arthritis    "hands, knees" (08/28/2016)   CAP (community acquired pneumonia) 08/28/2016   GERD (gastroesophageal reflux disease)    Hyperlipidemia    Hypertension    Pneumonia ~ 2015   Seasonal allergies    Syncope    Syncope and collapse 08/28/2016   Type II diabetes mellitus ([facility]    Past Surgical History:  Procedure Laterality Date   BUNIONECTOMY WITH HAMMERTOE RECONSTRUCTION Bilateral    COLONOSCOPY  10/27/2004   normal   COLONOSCOPY WITH PROPOFOL  12/08/2014   polyps-Dr.Kaplan  POLYPECTOMY     Family History  Problem Relation Age of Onset   Diabetes Mother    Heart disease Mother    Hypertension Mother    Arthritis Mother    Hypertension Sister    Hypertension Sister    Cancer Neg Hx    Alcohol abuse Neg Hx    Drug abuse Neg Hx    Early death Neg Hx    Kidney disease Neg Hx    Stroke Neg Hx    Colon cancer Neg Hx    Social History   Socioeconomic History   Marital status: Married    Spouse name: Ruby   Number of children: Not on file   Years of education: Not on file   Highest education level: Not on file  Occupational History   Not on file  Tobacco Use   Smoking status: Never   Smokeless tobacco:  Never  Vaping Use   Vaping Use: Never used  Substance and Sexual Activity   Alcohol use: Yes    Alcohol/week: 1.0 standard drink of alcohol    Types: 1 Standard drinks or equivalent per week   Drug use: No   Sexual activity: Never  Other Topics Concern   Not on file  Social History Narrative   Not on file   Social Determinants of Health   Financial Resource Strain: Low Risk  (07/31/2022)   Overall Financial Resource Strain (CARDIA)    Difficulty of Paying Living Expenses: Not hard at all  Food Insecurity: No Food Insecurity (07/31/2022)   Hunger Vital Sign    Worried About Running Out of Food in the Last Year: Never true    Ran Out of Food in the Last Year: Never true  Transportation Needs: No Transportation Needs (07/31/2022)   PRAPARE - Hydrologist (Medical): No    Lack of Transportation (Non-Medical): No  Physical Activity: Sufficiently Active (07/31/2022)   Exercise Vital Sign    Days of Exercise per Week: 3 days    Minutes of Exercise per Session: 50 min  Stress: No Stress Concern Present (07/31/2022)   [place]    Feeling of Stress : Not at all  Social Connections: Unknown (07/31/2022)   Social Connection and Isolation Panel [NHANES]    Frequency of Communication with Friends and Family: More than three times a week    Frequency of Social Gatherings with Friends and Family: More than three times a week    Attends Religious Services: 1 to 4 times per year    Active Member of [employer] or Organizations: Yes    Attends Archivist Meetings: 1 to 4 times per year    Marital Status: Not on file    Tobacco Counseling Counseling given: Not Answered   Clinical Intake:  Pre-visit preparation completed: Yes  Pain : 0-10 Pain Score: 2  Pain Type: Chronic pain Pain Location: Generalized Pain Descriptors / Indicators: Aching, Discomfort, Dull Pain Relieving Factors:  medication  Pain Relieving Factors: medication  Nutritional Status: BMI 25 -29 Overweight Nutritional Risks: None Diabetes: Yes CBG done?: No (phone visit) Did pt. bring in CBG monitor from home?: No (phone visit)  How often do you need to have someone help you when you read instructions, pamphlets, or other written materials from your doctor or pharmacy?: 1 - Never  Diabetic?Yes Nutrition Risk Assessment:  Has the patient had any N/V/D within the last 2 months?  No  Does the patient have any non-healing wounds?  No  Has the patient had any unintentional weight loss or weight gain?  No   Diabetes:  Is the patient diabetic?  Yes  If diabetic, was a CBG obtained today?  No , phone visit  Did the patient bring in their glucometer from home?  No  , phone visit  How often do you monitor your CBG's? Reports occasionally .   Financial Strains and Diabetes Management:  Are you having any financial strains with the device, your supplies or your medication? No .  Does the patient want to be seen by Chronic Care Management for management of their diabetes?  No  Would the patient like to be referred to a Nutritionist or for Diabetic Management?  No   Diabetic Exams:  Diabetic Eye Exam: Completed patient reports he has his eye examined yearly through the [place] Diabetic Foot Exam: Completed 08/05/21   Interpreter Needed?: No  Information entered by :: Emelia Loron RN   Activities of Daily Living    07/31/2022   11:03 AM  In your present state of health, do you have any difficulty performing the following activities:  Hearing? 0  Vision? 0  Difficulty concentrating or making decisions? 0  Walking or climbing stairs? 0  Dressing or bathing? 0  Doing errands, shopping? 0  Preparing Food and eating ? N  Using the Toilet? N  In the past six months, have you accidently leaked urine? N  Do you have problems with loss of bowel control? N  Managing your Medications? N  Managing your  Finances? N  Housekeeping or managing your Housekeeping? N    Patient Care Team: Janith Lima, MD as PCP - General (Internal Medicine)  Indicate any recent Medical Services you may have received from other than [facility] providers in the past year (date may be approximate).     Assessment:   This is a routine wellness examination for Brandon Shannon.  Hearing/Vision screen No results found.  Dietary issues and exercise activities discussed: Current Exercise Habits: Home exercise routine;Structured exercise class, Type of exercise: walking;strength training/weights, Time (Minutes): 50, Frequency (Times/Week): 3, Weekly Exercise (Minutes/Week): 150, Intensity: Mild, Exercise limited by: orthopedic condition(s)   Goals Addressed             This Visit's Progress    Patient Stated       Continue to stay physically active and try to eat healthy.      Depression Screen    07/31/2022   10:52 AM 05/25/2022    1:51 PM 03/29/2021    8:13 AM 04/22/2019    8:24 AM 07/18/2018    8:17 AM 06/04/2017    3:32 PM 03/06/2016    8:13 AM  PHQ 2/9 Scores  PHQ - 2 Score 0 0 0 0 0 0 0    Fall Risk    07/31/2022   10:53 AM 05/25/2022    1:51 PM 03/29/2021    8:13 AM 04/22/2019    8:24 AM 06/04/2017    3:32 PM  [place] in the past year? 0 0 0 0 Yes  Number falls in past yr: 0 0  0 2 or more  Injury with Fall? 0 0  0 No  Risk for fall due to : No Fall Risks No Fall Risks     Follow up Falls evaluation completed Falls evaluation completed  Falls evaluation completed     [place]  HOME:  Any stairs in or around the home? Yes  If so, are there any without handrails? Yes  Home free of loose throw rugs in walkways, pet beds, electrical cords, etc? Yes  Adequate lighting in your home to reduce risk of falls? Yes   ASSISTIVE DEVICES UTILIZED TO PREVENT FALLS:  Life alert? No  Use of a cane, walker or w/c? No  Grab bars in the bathroom? No  Shower chair or bench  in shower? No  Elevated toilet seat or a handicapped toilet? No   Cognitive Function:        07/31/2022   10:53 AM  6CIT Screen  What Year? 0 points  What month? 0 points  What time? 0 points  Count back from 20 0 points  Months in reverse 0 points  Repeat phrase 0 points  Total Score 0 points    Immunizations Immunization History  Administered Date(s) Administered   Fluad Quad(high Dose 65+) 04/22/2019   Hepatitis A, Adult 05/08/1995, 06/20/1996   Hepatitis B, adult 12/24/1995   Influenza,inj,Quad PF,6+ Mos 06/28/2017, 04/28/2018   Influenza-Unspecified 06/23/2015, 05/28/2016, 07/06/2021   Meningococcal Polysaccharide 12/08/1996   PFIZER(Purple Top)SARS-COV-2 Vaccination 10/05/2019, 10/29/2019   Pneumococcal Conjugate-13 10/24/2017, 04/22/2019   Pneumococcal Polysaccharide-23 04/02/2013, 04/08/2014, 07/20/2020   Rubella 08/19/1981   Tdap 03/19/2008, 07/18/2018   Zoster Recombinat (Shingrix) 12/06/2018, 12/27/2018, 05/29/2019   Zoster, Unspecified 12/27/2018    TDAP status: Up to date  Flu Vaccine status: Up to date  Pneumococcal vaccine status: Up to date  Covid-19 vaccine status: Information provided on how to obtain vaccines.   Qualifies for Shingles Vaccine? Yes   Zostavax completed No   Shingrix Completed?: Yes  Screening Tests Health Maintenance  Topic Date Due   OPHTHALMOLOGY EXAM  02/11/2022   COVID-19 Vaccine (3 - 2023-24 season) 04/28/2022   HEMOGLOBIN A1C  08/02/2022   FOOT EXAM  08/05/2022   Diabetic kidney evaluation - GFR measurement  02/01/2023   Diabetic kidney evaluation - Urine ACR  02/01/2023   Medicare Annual Wellness (AWV)  08/01/2023   DTaP/Tdap/Td (3 - Td or Tdap) 07/18/2028   COLONOSCOPY (Pts 45-50yr Insurance coverage will need to be confirmed)  02/10/2029   Pneumonia Vaccine 66 Years old  Completed   INFLUENZA VACCINE  Completed   Hepatitis C Screening  Completed   Zoster Vaccines- Shingrix  Completed   HPV VACCINES  Aged Out     Health Maintenance  Health Maintenance Due  Topic Date Due   OPHTHALMOLOGY EXAM  02/11/2022   COVID-19 Vaccine (3 - 2023-24 season) 04/28/2022    Colorectal cancer screening: Type of screening: Colonoscopy. Completed 03/12/22. Repeat every 7 years  Lung Cancer Screening: (Low Dose CT Chest recommended if Age 483-80years, 30 pack-year currently smoking OR have quit w/in 15years.) does not qualify.   Additional Screening:  Hepatitis C Screening: does qualify; Completed 04/22/19  Vision Screening: Recommended annual ophthalmology exams for early detection of glaucoma and other disorders of the eye. Is the patient up to date with their annual eye exam?  Yes  Who is the provider or what is the name of the office in which the patient attends [facility] exams? [facility] If pt is not established with a provider, would they like to be referred to a provider to establish care?  N/A .   Dental Screening: Recommended annual dental exams for proper oral hygiene  Community Resource Referral / Chronic Care Management: CRR required this visit?  No   CCM required  this visit?  No      Plan:     I have personally reviewed and noted the following in the patient's chart:   Medical and social history Use of alcohol, tobacco or illicit drugs  Current medications and supplements including opioid prescriptions. Patient is not currently taking opioid prescriptions. Functional ability and status Nutritional status Physical activity Advanced directives List of other physicians Hospitalizations, surgeries, and ER visits in previous 12 months Vitals Screenings to include cognitive, depression, and falls Referrals and appointments  In addition, I have reviewed and discussed with patient certain preventive protocols, quality metrics, and best practice recommendations. A written personalized care plan for preventive services as well as general preventive health recommendations were provided to patient.      Michiel Cowboy, RN   07/31/2022   Nurse Notes:  Brandon Shannon , Thank you for taking time to come for your Medicare Wellness Visit. I appreciate your ongoing commitment to your health goals. Please review the following plan we discussed and let me know if I can assist you in the future.   These are the goals we discussed:  Goals      Patient Stated     Continue to stay physically active and try to eat healthy.        This is a list of the screening recommended for you and due dates:  Health Maintenance  Topic Date Due   Eye exam for diabetics  02/11/2022   COVID-19 Vaccine (3 - 2023-24 season) 04/28/2022   Hemoglobin A1C  08/02/2022   Complete foot exam   08/05/2022   Yearly kidney function blood test for diabetes  02/01/2023   Yearly kidney health urinalysis for diabetes  02/01/2023   Medicare Annual Wellness Visit  08/01/2023   DTaP/Tdap/Td vaccine (3 - Td or Tdap) 07/18/2028   Colon Cancer Screening  02/10/2029   Pneumonia Vaccine  Completed   Flu Shot  Completed   Hepatitis C Screening: USPSTF Recommendation to screen - Ages 42-79 yo.  Completed   Zoster (Shingles) Vaccine  Completed   HPV Vaccine  Aged Out

## 2022-07-31 ENCOUNTER — Ambulatory Visit (INDEPENDENT_AMBULATORY_CARE_PROVIDER_SITE_OTHER): Payer: Medicare Other | Admitting: *Deleted

## 2022-07-31 DIAGNOSIS — Z0000 Encounter for general adult medical examination without abnormal findings: Secondary | ICD-10-CM

## 2022-08-01 ENCOUNTER — Encounter: Payer: Self-pay | Admitting: Internal Medicine

## 2022-08-01 ENCOUNTER — Ambulatory Visit (INDEPENDENT_AMBULATORY_CARE_PROVIDER_SITE_OTHER): Payer: Medicare Other | Admitting: Internal Medicine

## 2022-08-01 VITALS — BP 136/86 | HR 71 | Temp 98.3°F | Resp 16 | Ht 71.0 in | Wt 235.0 lb

## 2022-08-01 DIAGNOSIS — E118 Type 2 diabetes mellitus with unspecified complications: Secondary | ICD-10-CM | POA: Diagnosis not present

## 2022-08-01 DIAGNOSIS — N182 Chronic kidney disease, stage 2 (mild): Secondary | ICD-10-CM

## 2022-08-01 DIAGNOSIS — I10 Essential (primary) hypertension: Secondary | ICD-10-CM | POA: Diagnosis not present

## 2022-08-01 DIAGNOSIS — N138 Other obstructive and reflux uropathy: Secondary | ICD-10-CM | POA: Diagnosis not present

## 2022-08-01 DIAGNOSIS — N401 Enlarged prostate with lower urinary tract symptoms: Secondary | ICD-10-CM

## 2022-08-01 DIAGNOSIS — E785 Hyperlipidemia, unspecified: Secondary | ICD-10-CM | POA: Diagnosis not present

## 2022-08-01 DIAGNOSIS — L910 Hypertrophic scar: Secondary | ICD-10-CM

## 2022-08-01 LAB — URINALYSIS, ROUTINE W REFLEX MICROSCOPIC
Bilirubin Urine: NEGATIVE
Hgb urine dipstick: NEGATIVE
Ketones, ur: NEGATIVE
Leukocytes,Ua: NEGATIVE
Nitrite: NEGATIVE
Specific Gravity, Urine: 1.02 (ref 1.000–1.030)
Total Protein, Urine: NEGATIVE
Urine Glucose: NEGATIVE
Urobilinogen, UA: 0.2 (ref 0.0–1.0)
pH: 7.5 (ref 5.0–8.0)

## 2022-08-01 LAB — HEPATIC FUNCTION PANEL
ALT: 20 U/L (ref 0–53)
AST: 21 U/L (ref 0–37)
Albumin: 4.2 g/dL (ref 3.5–5.2)
Alkaline Phosphatase: 73 U/L (ref 39–117)
Bilirubin, Direct: 0.1 mg/dL (ref 0.0–0.3)
Total Bilirubin: 0.5 mg/dL (ref 0.2–1.2)
Total Protein: 7.2 g/dL (ref 6.0–8.3)

## 2022-08-01 LAB — LIPID PANEL
Cholesterol: 147 mg/dL (ref 0–200)
HDL: 58.3 mg/dL (ref >39.00)
LDL Cholesterol: 72 mg/dL (ref 0–99)
NonHDL: 88.73
Total CHOL/HDL Ratio: 3
Triglycerides: 83 mg/dL (ref 0.0–149.0)
VLDL: 16.6 mg/dL (ref 0.0–40.0)

## 2022-08-01 LAB — BASIC METABOLIC PANEL
BUN: 19 mg/dL (ref 6–23)
CO2: 30 mEq/L (ref 19–32)
Calcium: 9.5 mg/dL (ref 8.4–10.5)
Chloride: 101 mEq/L (ref 96–112)
Creatinine, Ser: 1.19 mg/dL (ref 0.40–1.50)
GFR: 62.79 mL/min (ref >60.00)
Glucose, Bld: 191 mg/dL — ABNORMAL HIGH (ref 70–99)
Potassium: 4.7 mEq/L (ref 3.5–5.1)
Sodium: 137 mEq/L (ref 135–145)

## 2022-08-01 LAB — HEMOGLOBIN A1C: Hgb A1c MFr Bld: 7.8 % — ABNORMAL HIGH (ref 4.6–6.5)

## 2022-08-01 LAB — PSA: PSA: 0.41 ng/mL (ref 0.10–4.00)

## 2022-08-01 LAB — TSH: TSH: 1.3 u[IU]/mL (ref 0.35–5.50)

## 2022-08-01 NOTE — Progress Notes (Signed)
Subjective:  Patient ID: Brandon Shannon, male    DOB: 17-Oct-1955  Age: 66 y.o. MRN: [id number]  CC: Hypertension, Hyperlipidemia, and Diabetes   HPI Brandon Shannon presents for f/up -  He is active and denies chest pain, shortness of breath, diaphoresis, or edema.  Most of his health care is provided through the [place].  Outpatient Medications Prior to Visit  Medication Sig Dispense Refill   ALPRAZolam (XANAX) 0.5 MG tablet Take 0.5 mg by mouth daily as needed for anxiety.     carvedilol (COREG) 12.5 MG tablet Take 1 tablet (12.5 mg total) by mouth 2 (two) times daily with a meal. 180 tablet 1   cetirizine (ZYRTEC) 10 MG tablet Take 1 tablet by mouth daily.     Cholecalciferol 50 MCG (2000 UT) TABS Take 1 tablet (2,000 Units total) by mouth daily. 90 tablet 1   fluticasone (FLONASE) 50 MCG/ACT nasal spray INSTILL 2 SPRAYS IN EACH NOSTRIL TWICE A DAY     gabapentin (NEURONTIN) 100 MG capsule Take 100 mg by mouth 2 (two) times daily.     losartan-hydrochlorothiazide (HYZAAR) 50-12.5 MG tablet Take 1 tablet by mouth daily. 90 tablet 1   meloxicam (MOBIC) 15 MG tablet Take 1 tablet (15 mg total) by mouth daily. 30 tablet 0   metFORMIN (GLUCOPHAGE XR) 750 MG 24 hr tablet Take 1 tablet (750 mg total) by mouth daily with breakfast. 90 tablet 1   Omeprazole 20 MG TBEC TAKE 1 CAPSULE BY MOUTH IN THE MORNING AS NEEDED     rosuvastatin (CRESTOR) 40 MG tablet Take 40 mg by mouth daily.     sildenafil (VIAGRA) 100 MG tablet TAKE ONE TABLET BY MOUTH AS INSTRUCTED AS NEEDED (TAKE 1 HOUR PRIOR TO SEXUAL ACTIVITY *DO NOT EXCEED 1 DOSE PER 24 HOUR PERIOD*)     tamsulosin (FLOMAX) 0.4 MG CAPS capsule Take 1 capsule (0.4 mg total) by mouth daily after supper. 90 capsule 1   No facility-administered medications prior to visit.    ROS Review of Systems  Constitutional: Negative.  Negative for diaphoresis and fatigue.  HENT: Negative.    Eyes: Negative.   Respiratory:  Negative for cough, chest tightness,  shortness of breath and wheezing.   Cardiovascular:  Negative for chest pain, palpitations and leg swelling.  Gastrointestinal:  Negative for abdominal pain, diarrhea, nausea and vomiting.  Endocrine: Negative.   Genitourinary: Negative.  Negative for difficulty urinating and dysuria.  Musculoskeletal: Negative.  Negative for arthralgias and myalgias.  Skin: Negative.   Neurological: Negative.   Hematological:  Negative for adenopathy. Does not bruise/bleed easily.  Psychiatric/Behavioral: Negative.      Objective:  BP 136/86 (BP Location: Right Arm, Patient Position: Sitting, Cuff Size: Large)   Pulse 71   Temp 98.3 F (36.8 C) (Oral)   Resp 16   Ht '5\' 11"'$  (1.803 m)   Wt 235 lb (106.6 kg)   SpO2 96%   BMI 32.78 kg/m   BP Readings from Last 3 Encounters:  08/01/22 136/86  06/21/22 122/78  05/30/22 120/68    Wt Readings from Last 3 Encounters:  08/01/22 235 lb (106.6 kg)  06/21/22 227 lb (103 kg)  05/30/22 229 lb (103.9 kg)    Physical Exam Vitals reviewed.  HENT:     Nose: Nose normal.     Mouth/Throat:     Mouth: Mucous membranes are moist.  Eyes:     General: No scleral icterus.    Conjunctiva/sclera: Conjunctivae normal.  Cardiovascular:  Rate and Rhythm: Normal rate and regular rhythm.     Heart sounds: No murmur heard. Pulmonary:     Effort: Pulmonary effort is normal.     Breath sounds: No stridor. No wheezing, rhonchi or rales.  Abdominal:     General: Abdomen is flat.     Palpations: There is no mass.     Tenderness: There is no abdominal tenderness. There is no guarding.     Hernia: No hernia is present. There is no hernia in the left inguinal area or right inguinal area.  Genitourinary:    Pubic Area: No rash.      Penis: Normal and circumcised.      Testes: Normal.     Epididymis:     Right: Normal.     Left: Normal.     Prostate: Enlarged. Not tender and no nodules present.     Rectum: Normal. Guaiac result negative. No mass, tenderness,  anal fissure, external hemorrhoid or internal hemorrhoid. Normal anal tone.  Musculoskeletal:        General: Normal range of motion.     Cervical back: Neck supple.     Right lower leg: No edema.     Left lower leg: No edema.  Lymphadenopathy:     Cervical: No cervical adenopathy.     Lower Body: No right inguinal adenopathy. No left inguinal adenopathy.  Skin:    General: Skin is warm and dry.  Neurological:     General: No focal deficit present.     Mental Status: He is alert. Mental status is at baseline.  Psychiatric:        Mood and Affect: Mood normal.        Behavior: Behavior normal.     Lab Results  Component Value Date   WBC 7.3 01/14/2022   HGB 14.9 01/14/2022   HCT 44.6 01/14/2022   PLT 153 01/14/2022   GLUCOSE 191 (H) 08/01/2022   CHOL 147 08/01/2022   TRIG 83.0 08/01/2022   HDL 58.30 08/01/2022   LDLCALC 72 08/01/2022   ALT 20 08/01/2022   AST 21 08/01/2022   NA 137 08/01/2022   K 4.7 08/01/2022   CL 101 08/01/2022   CREATININE 1.19 08/01/2022   BUN 19 08/01/2022   CO2 30 08/01/2022   TSH 1.30 08/01/2022   PSA 0.41 08/01/2022   INR 1.14 08/28/2016   HGBA1C 7.8 (H) 08/01/2022   MICROALBUR <0.7 01/31/2022    CT Angio Chest/Abd/Pel for Dissection W and/or Wo Contrast  Result Date: 01/14/2022 CLINICAL DATA:  Severe chest pain radiating to back. Suspected aortic dissection. EXAM: CT ANGIOGRAPHY CHEST, ABDOMEN AND PELVIS TECHNIQUE: Non-contrast CT of the chest was initially obtained. Multidetector CT imaging through the chest, abdomen and pelvis was performed using the standard protocol during bolus administration of intravenous contrast. Multiplanar reconstructed images and MIPs were obtained and reviewed to evaluate the vascular anatomy. RADIATION DOSE REDUCTION: This exam was performed according to the departmental dose-optimization program which includes automated exposure control, adjustment of the mA and/or kV according to patient size and/or use of  iterative reconstruction technique. CONTRAST:  173m OMNIPAQUE IOHEXOL 350 MG/ML SOLN COMPARISON:  AP only CT on 06/27/2009 FINDINGS: CTA CHEST FINDINGS Cardiovascular: No evidence of thoracic aortic dissection or aneurysm. No pulmonary embolism identified. Mediastinum/Nodes: No masses or pathologically enlarged lymph nodes identified. Lungs/Pleura: No pulmonary mass, infiltrate, or effusion. Musculoskeletal: No suspicious bone lesions identified. Review of the MIP images confirms the above findings. CTA ABDOMEN AND PELVIS FINDINGS  VASCULAR Aorta: No aortic aneurysm or dissection. Celiac: No atherosclerotic plaque or stenosis. SMA: No atherosclerotic plaque or stenosis. Renals: No atherosclerotic plaque or stenosis. IMA: Patent Inflow: Unremarkable Veins: Unremarkable Review of the MIP images confirms the above findings. NON-VASCULAR Hepatobiliary: No hepatic masses identified. Gallbladder is unremarkable. No evidence of biliary ductal dilatation. Pancreas:  No mass or inflammatory changes. Spleen: Within normal limits in size and appearance. Adrenals/Urinary Tract: No masses identified. No evidence of ureteral calculi or hydronephrosis. Stomach/Bowel: No evidence of obstruction, inflammatory process or abnormal fluid collections. Vascular/Lymphatic: No pathologically enlarged lymph nodes. No acute vascular findings. Reproductive:  No mass or other significant abnormality. Other:  None. Musculoskeletal:  No suspicious bone lesions identified. Review of the MIP images confirms the above findings. IMPRESSION: No evidence of aortic dissection, aneurysm, or other acute findings. Electronically Signed   By: Marlaine Hind M.D.   On: 01/14/2022 12:32    Assessment & Plan:   Brandon Shannon was seen today for hypertension, hyperlipidemia and diabetes.  Diagnoses and all orders for this visit:  Type II diabetes mellitus with manifestations ([place])- His blood sugar is adequately well controlled. -     Hemoglobin A1c; Future -      Basic metabolic panel; Future -     HM Diabetes Foot Exam -     Basic metabolic panel -     Hemoglobin A1c  Essential hypertension- His blood pressure is well controlled. -     TSH; Future -     Hepatic function panel; Future -     Basic metabolic panel; Future -     Urinalysis, Routine w reflex microscopic; Future -     Urinalysis, Routine w reflex microscopic -     Basic metabolic panel -     Hepatic function panel -     TSH  Hyperlipidemia with target LDL less than 100- LDL goal achieved. Doing well on the statin  -     Lipid panel; Future -     TSH; Future -     Hepatic function panel; Future -     Hepatic function panel -     TSH -     Lipid panel  BPH with obstruction/lower urinary tract symptoms- His PSA is normal. -     PSA; Future -     Urinalysis, Routine w reflex microscopic; Future -     Urinalysis, Routine w reflex microscopic -     PSA  Keloid -     Ambulatory referral to Dermatology   I am having Brandon Shannon maintain his rosuvastatin, tamsulosin, gabapentin, ALPRAZolam, carvedilol, losartan-hydrochlorothiazide, Cholecalciferol, cetirizine, fluticasone, sildenafil, metFORMIN, Omeprazole, and meloxicam.  No orders of the defined types were placed in this encounter.    Follow-up: Return in about 6 months (around 01/31/2023).  Scarlette Calico, MD

## 2022-08-01 NOTE — Patient Instructions (Signed)
Hypertension, Adult High blood pressure (hypertension) is when the force of blood pumping through the arteries is too strong. The arteries are the blood vessels that carry blood from the heart throughout the body. Hypertension forces the heart to work harder to pump blood and may cause arteries to become narrow or stiff. Untreated or uncontrolled hypertension can lead to a heart attack, heart failure, a stroke, kidney disease, and other problems. A blood pressure reading consists of a higher number over a lower number. Ideally, your blood pressure should be below 120/80. The first ("top") number is called the systolic pressure. It is a measure of the pressure in your arteries as your heart beats. The second ("bottom") number is called the diastolic pressure. It is a measure of the pressure in your arteries as the heart relaxes. What are the causes? The exact cause of this condition is not known. There are some conditions that result in high blood pressure. What increases the risk? Certain factors may make you more likely to develop high blood pressure. Some of these risk factors are under your control, including: Smoking. Not getting enough exercise or physical activity. Being overweight. Having too much fat, sugar, calories, or salt (sodium) in your diet. Drinking too much alcohol. Other risk factors include: Having a personal history of heart disease, diabetes, high cholesterol, or kidney disease. Stress. Having a family history of high blood pressure and high cholesterol. Having obstructive sleep apnea. Age. The risk increases with age. What are the signs or symptoms? High blood pressure may not cause symptoms. Very high blood pressure (hypertensive crisis) may cause: Headache. Fast or irregular heartbeats (palpitations). Shortness of breath. Nosebleed. Nausea and vomiting. Vision changes. Severe chest pain, dizziness, and seizures. How is this diagnosed? This condition is diagnosed by  measuring your blood pressure while you are seated, with your arm resting on a flat surface, your legs uncrossed, and your feet flat on the floor. The cuff of the blood pressure monitor will be placed directly against the skin of your upper arm at the level of your heart. Blood pressure should be measured at least twice using the same arm. Certain conditions can cause a difference in blood pressure between your right and left arms. If you have a high blood pressure reading during one visit or you have normal blood pressure with other risk factors, you may be asked to: Return on a different day to have your blood pressure checked again. Monitor your blood pressure at home for 1 week or longer. If you are diagnosed with hypertension, you may have other blood or imaging tests to help your health care provider understand your overall risk for other conditions. How is this treated? This condition is treated by making healthy lifestyle changes, such as eating healthy foods, exercising more, and reducing your alcohol intake. You may be referred for counseling on a healthy diet and physical activity. Your health care provider may prescribe medicine if lifestyle changes are not enough to get your blood pressure under control and if: Your systolic blood pressure is above 130. Your diastolic blood pressure is above 80. Your personal target blood pressure may vary depending on your medical conditions, your age, and other factors. Follow these instructions at home: Eating and drinking  Eat a diet that is high in fiber and potassium, and low in sodium, added sugar, and fat. An example of this eating plan is called the DASH diet. DASH stands for Dietary Approaches to Stop Hypertension. To eat this way: Eat   plenty of fresh fruits and vegetables. Try to fill one half of your plate at each meal with fruits and vegetables. Eat whole grains, such as whole-wheat pasta, brown rice, or whole-grain bread. Fill about one  fourth of your plate with whole grains. Eat or drink low-fat dairy products, such as skim milk or low-fat yogurt. Avoid fatty cuts of meat, processed or cured meats, and poultry with skin. Fill about one fourth of your plate with lean proteins, such as fish, chicken without skin, beans, eggs, or tofu. Avoid pre-made and processed foods. These tend to be higher in sodium, added sugar, and fat. Reduce your daily sodium intake. Many people with hypertension should eat less than 1,500 mg of sodium a day. Do not drink alcohol if: Your health care provider tells you not to drink. You are pregnant, may be pregnant, or are planning to become pregnant. If you drink alcohol: Limit how much you have to: 0-1 drink a day for women. 0-2 drinks a day for men. Know how much alcohol is in your drink. In the [place] one drink equals one 12 oz bottle of beer (355 mL), one 5 oz glass of wine (148 mL), or one 1 oz glass of hard liquor (44 mL). Lifestyle  Work with your health care provider to maintain a healthy body weight or to lose weight. Ask what an ideal weight is for you. Get at least 30 minutes of exercise that causes your heart to beat faster (aerobic exercise) most days of the week. Activities may include walking, swimming, or biking. Include exercise to strengthen your muscles (resistance exercise), such as Pilates or lifting weights, as part of your weekly exercise routine. Try to do these types of exercises for 30 minutes at least 3 days a week. Do not use any products that contain nicotine or tobacco. These products include cigarettes, chewing tobacco, and vaping devices, such as e-cigarettes. If you need help quitting, ask your health care provider. Monitor your blood pressure at home as told by your health care provider. Keep all follow-up visits. This is important. Medicines Take over-the-counter and prescription medicines only as told by your health care provider. Follow directions carefully. Blood  pressure medicines must be taken as prescribed. Do not skip doses of blood pressure medicine. Doing this puts you at risk for problems and can make the medicine less effective. Ask your health care provider about side effects or reactions to medicines that you should watch for. Contact a health care provider if you: Think you are having a reaction to a medicine you are taking. Have headaches that keep coming back (recurring). Feel dizzy. Have swelling in your ankles. Have trouble with your vision. Get help right away if you: Develop a severe headache or confusion. Have unusual weakness or numbness. Feel faint. Have severe pain in your chest or abdomen. Vomit repeatedly. Have trouble breathing. These symptoms may be an emergency. Get help right away. Call 911. Do not wait to see if the symptoms will go away. Do not drive yourself to the hospital. Summary Hypertension is when the force of blood pumping through your arteries is too strong. If this condition is not controlled, it may put you at risk for serious complications. Your personal target blood pressure may vary depending on your medical conditions, your age, and other factors. For most people, a normal blood pressure is less than 120/80. Hypertension is treated with lifestyle changes, medicines, or a combination of both. Lifestyle changes include losing weight, eating a healthy,   low-sodium diet, exercising more, and limiting alcohol. This information is not intended to replace advice given to you by your health care provider. Make sure you discuss any questions you have with your health care provider. Document Revised: 06/21/2021 Document Reviewed: 06/21/2021 [employer]vier Patient Education  2023 [employer]  

## 2022-08-02 ENCOUNTER — Encounter: Payer: Self-pay | Admitting: Internal Medicine

## 2022-08-02 DIAGNOSIS — N182 Chronic kidney disease, stage 2 (mild): Secondary | ICD-10-CM | POA: Insufficient documentation

## 2022-08-02 MED ORDER — METFORMIN HCL ER 750 MG PO TB24: 1500.0000 mg | ORAL_TABLET | Freq: Every day | ORAL | 1 refills | Status: DC

## 2022-08-02 MED ORDER — EMPAGLIFLOZIN 10 MG PO TABS: 10.0000 mg | ORAL_TABLET | Freq: Every day | ORAL | 0 refills | Status: DC

## 2022-10-19 LAB — BASIC METABOLIC PANEL
BUN: 22 — AB (ref 4–21)
CO2: 29 — AB (ref 13–22)
Chloride: 104 (ref 99–108)
Creatinine: 1.4 — AB (ref 0.6–1.3)
Glucose: 130
Potassium: 3.5 mEq/L (ref 3.5–5.1)
Sodium: 138 (ref 137–147)

## 2022-10-19 LAB — HEPATIC FUNCTION PANEL
ALT: 25 U/L (ref 10–40)
AST: 19 (ref 14–40)
Alkaline Phosphatase: 80 (ref 25–125)
Bilirubin, Total: 0.5

## 2022-10-19 LAB — COMPREHENSIVE METABOLIC PANEL
Albumin: 3.7 (ref 3.5–5.0)
Calcium: 9.3 (ref 8.7–10.7)
eGFR: 55

## 2022-10-19 LAB — AMB RESULTS CONSOLE CBG: Glucose: 130

## 2022-10-19 LAB — LIPID PANEL
Cholesterol: 124 (ref 0–200)
HDL: 55 (ref 35–70)
LDL Cholesterol: 50
Triglycerides: 93 (ref 40–160)

## 2022-10-19 LAB — LAB REPORT - SCANNED
A1c: 7
EGFR: 55

## 2022-10-19 LAB — CBC AND DIFFERENTIAL
HCT: 44 (ref 41–53)
Hemoglobin: 15.5 (ref 13.5–17.5)
Platelets: 185 10*3/uL (ref 150–400)
WBC: 8.6

## 2022-10-19 LAB — HEMOGLOBIN A1C: Hemoglobin A1C: 7

## 2022-10-31 ENCOUNTER — Ambulatory Visit (INDEPENDENT_AMBULATORY_CARE_PROVIDER_SITE_OTHER): Payer: Medicare Other | Admitting: Internal Medicine

## 2022-10-31 ENCOUNTER — Encounter: Payer: Self-pay | Admitting: Internal Medicine

## 2022-10-31 VITALS — BP 134/80 | HR 74 | Temp 98.4°F | Resp 16 | Ht 71.0 in | Wt 230.0 lb

## 2022-10-31 DIAGNOSIS — E118 Type 2 diabetes mellitus with unspecified complications: Secondary | ICD-10-CM

## 2022-10-31 DIAGNOSIS — N182 Chronic kidney disease, stage 2 (mild): Secondary | ICD-10-CM | POA: Diagnosis not present

## 2022-10-31 DIAGNOSIS — I10 Essential (primary) hypertension: Secondary | ICD-10-CM | POA: Diagnosis not present

## 2022-10-31 DIAGNOSIS — E785 Hyperlipidemia, unspecified: Secondary | ICD-10-CM

## 2022-10-31 MED ORDER — SCOPOLAMINE 1 MG/3DAYS TD PT72: 1.0000 | MEDICATED_PATCH | TRANSDERMAL | 0 refills | Status: DC

## 2022-10-31 NOTE — Patient Instructions (Signed)
Hypertension, Adult High blood pressure (hypertension) is when the force of blood pumping through the arteries is too strong. The arteries are the blood vessels that carry blood from the heart throughout the body. Hypertension forces the heart to work harder to pump blood and may cause arteries to become narrow or stiff. Untreated or uncontrolled hypertension can lead to a heart attack, heart failure, a stroke, kidney disease, and other problems. A blood pressure reading consists of a higher number over a lower number. Ideally, your blood pressure should be below 120/80. The first ("top") number is called the systolic pressure. It is a measure of the pressure in your arteries as your heart beats. The second ("bottom") number is called the diastolic pressure. It is a measure of the pressure in your arteries as the heart relaxes. What are the causes? The exact cause of this condition is not known. There are some conditions that result in high blood pressure. What increases the risk? Certain factors may make you more likely to develop high blood pressure. Some of these risk factors are under your control, including: Smoking. Not getting enough exercise or physical activity. Being overweight. Having too much fat, sugar, calories, or salt (sodium) in your diet. Drinking too much alcohol. Other risk factors include: Having a personal history of heart disease, diabetes, high cholesterol, or kidney disease. Stress. Having a family history of high blood pressure and high cholesterol. Having obstructive sleep apnea. Age. The risk increases with age. What are the signs or symptoms? High blood pressure may not cause symptoms. Very high blood pressure (hypertensive crisis) may cause: Headache. Fast or irregular heartbeats (palpitations). Shortness of breath. Nosebleed. Nausea and vomiting. Vision changes. Severe chest pain, dizziness, and seizures. How is this diagnosed? This condition is diagnosed by  measuring your blood pressure while you are seated, with your arm resting on a flat surface, your legs uncrossed, and your feet flat on the floor. The cuff of the blood pressure monitor will be placed directly against the skin of your upper arm at the level of your heart. Blood pressure should be measured at least twice using the same arm. Certain conditions can cause a difference in blood pressure between your right and left arms. If you have a high blood pressure reading during one visit or you have normal blood pressure with other risk factors, you may be asked to: Return on a different day to have your blood pressure checked again. Monitor your blood pressure at home for 1 week or longer. If you are diagnosed with hypertension, you may have other blood or imaging tests to help your health care provider understand your overall risk for other conditions. How is this treated? This condition is treated by making healthy lifestyle changes, such as eating healthy foods, exercising more, and reducing your alcohol intake. You may be referred for counseling on a healthy diet and physical activity. Your health care provider may prescribe medicine if lifestyle changes are not enough to get your blood pressure under control and if: Your systolic blood pressure is above 130. Your diastolic blood pressure is above 80. Your personal target blood pressure may vary depending on your medical conditions, your age, and other factors. Follow these instructions at home: Eating and drinking  Eat a diet that is high in fiber and potassium, and low in sodium, added sugar, and fat. An example of this eating plan is called the DASH diet. DASH stands for Dietary Approaches to Stop Hypertension. To eat this way: Eat   plenty of fresh fruits and vegetables. Try to fill one half of your plate at each meal with fruits and vegetables. Eat whole grains, such as whole-wheat pasta, brown rice, or whole-grain bread. Fill about one  fourth of your plate with whole grains. Eat or drink low-fat dairy products, such as skim milk or low-fat yogurt. Avoid fatty cuts of meat, processed or cured meats, and poultry with skin. Fill about one fourth of your plate with lean proteins, such as fish, chicken without skin, beans, eggs, or tofu. Avoid pre-made and processed foods. These tend to be higher in sodium, added sugar, and fat. Reduce your daily sodium intake. Many people with hypertension should eat less than 1,500 mg of sodium a day. Do not drink alcohol if: Your health care provider tells you not to drink. You are pregnant, may be pregnant, or are planning to become pregnant. If you drink alcohol: Limit how much you have to: 0-1 drink a day for women. 0-2 drinks a day for men. Know how much alcohol is in your drink. In the [place] one drink equals one 12 oz bottle of beer (355 mL), one 5 oz glass of wine (148 mL), or one 1 oz glass of hard liquor (44 mL). Lifestyle  Work with your health care provider to maintain a healthy body weight or to lose weight. Ask what an ideal weight is for you. Get at least 30 minutes of exercise that causes your heart to beat faster (aerobic exercise) most days of the week. Activities may include walking, swimming, or biking. Include exercise to strengthen your muscles (resistance exercise), such as Pilates or lifting weights, as part of your weekly exercise routine. Try to do these types of exercises for 30 minutes at least 3 days a week. Do not use any products that contain nicotine or tobacco. These products include cigarettes, chewing tobacco, and vaping devices, such as e-cigarettes. If you need help quitting, ask your health care provider. Monitor your blood pressure at home as told by your health care provider. Keep all follow-up visits. This is important. Medicines Take over-the-counter and prescription medicines only as told by your health care provider. Follow directions carefully. Blood  pressure medicines must be taken as prescribed. Do not skip doses of blood pressure medicine. Doing this puts you at risk for problems and can make the medicine less effective. Ask your health care provider about side effects or reactions to medicines that you should watch for. Contact a health care provider if you: Think you are having a reaction to a medicine you are taking. Have headaches that keep coming back (recurring). Feel dizzy. Have swelling in your ankles. Have trouble with your vision. Get help right away if you: Develop a severe headache or confusion. Have unusual weakness or numbness. Feel faint. Have severe pain in your chest or abdomen. Vomit repeatedly. Have trouble breathing. These symptoms may be an emergency. Get help right away. Call 911. Do not wait to see if the symptoms will go away. Do not drive yourself to the hospital. Summary Hypertension is when the force of blood pumping through your arteries is too strong. If this condition is not controlled, it may put you at risk for serious complications. Your personal target blood pressure may vary depending on your medical conditions, your age, and other factors. For most people, a normal blood pressure is less than 120/80. Hypertension is treated with lifestyle changes, medicines, or a combination of both. Lifestyle changes include losing weight, eating a healthy,   low-sodium diet, exercising more, and limiting alcohol. This information is not intended to replace advice given to you by your health care provider. Make sure you discuss any questions you have with your health care provider. Document Revised: 06/21/2021 Document Reviewed: 06/21/2021 [employer]vier Patient Education  2023 [employer]  

## 2022-10-31 NOTE — Progress Notes (Signed)
Subjective:  Patient ID: Brandon Shannon, male    DOB: 05/01/1956  Age: 67 y.o. MRN: [id number]  CC: Hypertension, Hyperlipidemia, and Diabetes   HPI Brandon Shannon presents for f/up ----  He was seen at the [place] a week ago and had labs done.  His meds are prescribed through the [place].  He complains of chronic fatigue but is active and denies chest pain, shortness of breath, diaphoresis, or edema.  Outpatient Medications Prior to Visit  Medication Sig Dispense Refill   ALPRAZolam (XANAX) 0.5 MG tablet Take 0.5 mg by mouth daily as needed for anxiety.     amoxicillin (AMOXIL) 500 MG tablet Take 500 mg by mouth 2 (two) times daily.     carvedilol (COREG) 12.5 MG tablet Take 1 tablet (12.5 mg total) by mouth 2 (two) times daily with a meal. 180 tablet 1   cetirizine (ZYRTEC) 10 MG tablet Take 1 tablet by mouth daily.     Cholecalciferol 50 MCG (2000 UT) TABS Take 1 tablet (2,000 Units total) by mouth daily. 90 tablet 1   fluticasone (FLONASE) 50 MCG/ACT nasal spray INSTILL 2 SPRAYS IN EACH NOSTRIL TWICE A DAY     gabapentin (NEURONTIN) 100 MG capsule Take 100 mg by mouth 2 (two) times daily.     losartan-hydrochlorothiazide (HYZAAR) 50-12.5 MG tablet Take 1 tablet by mouth daily. 90 tablet 1   Omeprazole 20 MG TBEC TAKE 1 CAPSULE BY MOUTH IN THE MORNING AS NEEDED     rosuvastatin (CRESTOR) 40 MG tablet Take 40 mg by mouth daily.     sildenafil (VIAGRA) 100 MG tablet TAKE ONE TABLET BY MOUTH AS INSTRUCTED AS NEEDED (TAKE 1 HOUR PRIOR TO SEXUAL ACTIVITY *DO NOT EXCEED 1 DOSE PER 24 HOUR PERIOD*)     tamsulosin (FLOMAX) 0.4 MG CAPS capsule Take 1 capsule (0.4 mg total) by mouth daily after supper. 90 capsule 1   empagliflozin (JARDIANCE) 10 MG TABS tablet Take 1 tablet (10 mg total) by mouth daily before breakfast. 90 tablet 0   metFORMIN (GLUCOPHAGE XR) 750 MG 24 hr tablet Take 2 tablets (1,500 mg total) by mouth daily with breakfast. 180 tablet 1   No facility-administered medications prior to  visit.    ROS Review of Systems  Constitutional:  Positive for fatigue. Negative for appetite change, chills, diaphoresis and unexpected weight change.  HENT: Negative.    Eyes: Negative.   Respiratory:  Negative for chest tightness, shortness of breath and wheezing.   Cardiovascular:  Negative for chest pain, palpitations and leg swelling.  Gastrointestinal:  Negative for abdominal pain, constipation, diarrhea, nausea and vomiting.  Endocrine: Negative.   Genitourinary: Negative.  Negative for difficulty urinating.  Musculoskeletal:  Negative for arthralgias and myalgias.  Skin: Negative.   Neurological:  Negative for dizziness, weakness, light-headedness and headaches.  Hematological:  Negative for adenopathy. Does not bruise/bleed easily.  Psychiatric/Behavioral: Negative.      Objective:  BP 134/80 (BP Location: Left Arm, Patient Position: Sitting, Cuff Size: Normal)   Pulse 74   Temp 98.4 F (36.9 C) (Oral)   Resp 16   Ht '5\' 11"'$  (1.803 m)   Wt 230 lb (104.3 kg)   SpO2 95%   BMI 32.08 kg/m   BP Readings from Last 3 Encounters:  10/31/22 134/80  08/01/22 136/86  06/21/22 122/78    Wt Readings from Last 3 Encounters:  10/31/22 230 lb (104.3 kg)  08/01/22 235 lb (106.6 kg)  06/21/22 227 lb (103 kg)  Physical Exam Vitals reviewed.  HENT:     Nose: Nose normal.     Mouth/Throat:     Mouth: Mucous membranes are moist.  Eyes:     General: No scleral icterus.    Conjunctiva/sclera: Conjunctivae normal.  Cardiovascular:     Rate and Rhythm: Normal rate and regular rhythm.     Heart sounds: No murmur heard. Pulmonary:     Effort: Pulmonary effort is normal.     Breath sounds: No stridor. No wheezing, rhonchi or rales.  Abdominal:     General: Abdomen is flat.     Palpations: There is no mass.     Tenderness: There is no abdominal tenderness. There is no guarding.     Hernia: No hernia is present.  Musculoskeletal:        General: Normal range of motion.      Cervical back: Neck supple.     Right lower leg: No edema.     Left lower leg: No edema.  Lymphadenopathy:     Cervical: No cervical adenopathy.  Skin:    General: Skin is warm and dry.  Neurological:     General: No focal deficit present.     Mental Status: He is alert.  Psychiatric:        Mood and Affect: Mood normal.        Behavior: Behavior normal.     Lab Results  Component Value Date   WBC 8.6 10/19/2022   HGB 15.5 10/19/2022   HCT 44 10/19/2022   PLT 185 10/19/2022   GLUCOSE 191 (H) 08/01/2022   CHOL 124 10/19/2022   TRIG 93 10/19/2022   HDL 55 10/19/2022   LDLCALC 50 10/19/2022   ALT 25 10/19/2022   AST 19 10/19/2022   NA 138 10/19/2022   K 3.5 10/19/2022   CL 104 10/19/2022   CREATININE 1.4 (A) 10/19/2022   BUN 22 (A) 10/19/2022   CO2 29 (A) 10/19/2022   TSH 1.30 08/01/2022   PSA 0.41 08/01/2022   INR 1.14 08/28/2016   HGBA1C 7.0 10/19/2022   MICROALBUR <0.7 01/31/2022    CT Angio Chest/Abd/Pel for Dissection W and/or Wo Contrast  Result Date: 01/14/2022 CLINICAL DATA:  Severe chest pain radiating to back. Suspected aortic dissection. EXAM: CT ANGIOGRAPHY CHEST, ABDOMEN AND PELVIS TECHNIQUE: Non-contrast CT of the chest was initially obtained. Multidetector CT imaging through the chest, abdomen and pelvis was performed using the standard protocol during bolus administration of intravenous contrast. Multiplanar reconstructed images and MIPs were obtained and reviewed to evaluate the vascular anatomy. RADIATION DOSE REDUCTION: This exam was performed according to the departmental dose-optimization program which includes automated exposure control, adjustment of the mA and/or kV according to patient size and/or use of iterative reconstruction technique. CONTRAST:  148m OMNIPAQUE IOHEXOL 350 MG/ML SOLN COMPARISON:  AP only CT on 06/27/2009 FINDINGS: CTA CHEST FINDINGS Cardiovascular: No evidence of thoracic aortic dissection or aneurysm. No pulmonary embolism  identified. Mediastinum/Nodes: No masses or pathologically enlarged lymph nodes identified. Lungs/Pleura: No pulmonary mass, infiltrate, or effusion. Musculoskeletal: No suspicious bone lesions identified. Review of the MIP images confirms the above findings. CTA ABDOMEN AND PELVIS FINDINGS VASCULAR Aorta: No aortic aneurysm or dissection. Celiac: No atherosclerotic plaque or stenosis. SMA: No atherosclerotic plaque or stenosis. Renals: No atherosclerotic plaque or stenosis. IMA: Patent Inflow: Unremarkable Veins: Unremarkable Review of the MIP images confirms the above findings. NON-VASCULAR Hepatobiliary: No hepatic masses identified. Gallbladder is unremarkable. No evidence of biliary ductal dilatation. Pancreas:  No mass or inflammatory changes. Spleen: Within normal limits in size and appearance. Adrenals/Urinary Tract: No masses identified. No evidence of ureteral calculi or hydronephrosis. Stomach/Bowel: No evidence of obstruction, inflammatory process or abnormal fluid collections. Vascular/Lymphatic: No pathologically enlarged lymph nodes. No acute vascular findings. Reproductive:  No mass or other significant abnormality. Other:  None. Musculoskeletal:  No suspicious bone lesions identified. Review of the MIP images confirms the above findings. IMPRESSION: No evidence of aortic dissection, aneurysm, or other acute findings. Electronically Signed   By: Marlaine Hind M.D.   On: 01/14/2022 12:32    Assessment & Plan:   Ozmar was seen today for hypertension, hyperlipidemia and diabetes.  Diagnoses and all orders for this visit:  Bonnie labs reviewed  Essential hypertension- His blood pressure is adequately well-controlled.  Type II diabetes mellitus with manifestations ([place])- His blood sugar has been well-controlled. -     [facility] Diabetes Foot Exam  Hyperlipidemia with target LDL less than 100- LDL goal achieved. Doing well on the statin   Chronic renal disease, stage 2, mildly decreased glomerular  filtration rate (GFR) between 60-89 mL/min/1.73 square meter- He is taking Jardiance.  Other orders -     scopolamine (TRANSDERM-SCOP) 1 MG/3DAYS; Place 1 patch (1.5 mg total) onto the skin every 3 (three) days.   I have discontinued Graceson A. Misener's metFORMIN and empagliflozin. I am also having him start on scopolamine. Additionally, I am having him maintain his rosuvastatin, tamsulosin, gabapentin, ALPRAZolam, carvedilol, losartan-hydrochlorothiazide, Cholecalciferol, cetirizine, fluticasone, sildenafil, Omeprazole, and amoxicillin.  Meds ordered this encounter  Medications   scopolamine (TRANSDERM-SCOP) 1 MG/3DAYS    Sig: Place 1 patch (1.5 mg total) onto the skin every 3 (three) days.    Dispense:  4 patch    Refill:  0     Follow-up: Return in about 4 months (around 03/02/2023).  Scarlette Calico, MD

## 2023-01-30 ENCOUNTER — Ambulatory Visit: Payer: Medicare Other | Admitting: Internal Medicine

## 2023-03-07 ENCOUNTER — Ambulatory Visit (INDEPENDENT_AMBULATORY_CARE_PROVIDER_SITE_OTHER): Payer: Medicare Other | Admitting: Internal Medicine

## 2023-03-07 ENCOUNTER — Encounter: Payer: Self-pay | Admitting: Internal Medicine

## 2023-03-07 ENCOUNTER — Ambulatory Visit: Payer: Medicare Other | Admitting: Internal Medicine

## 2023-03-07 VITALS — BP 118/62 | HR 74 | Temp 98.1°F | Ht 71.0 in | Wt 222.0 lb

## 2023-03-07 DIAGNOSIS — R9431 Abnormal electrocardiogram [ECG] [EKG]: Secondary | ICD-10-CM

## 2023-03-07 DIAGNOSIS — E118 Type 2 diabetes mellitus with unspecified complications: Secondary | ICD-10-CM | POA: Diagnosis not present

## 2023-03-07 DIAGNOSIS — Z7984 Long term (current) use of oral hypoglycemic drugs: Secondary | ICD-10-CM | POA: Diagnosis not present

## 2023-03-07 DIAGNOSIS — E785 Hyperlipidemia, unspecified: Secondary | ICD-10-CM

## 2023-03-07 DIAGNOSIS — I10 Essential (primary) hypertension: Secondary | ICD-10-CM

## 2023-03-07 DIAGNOSIS — N182 Chronic kidney disease, stage 2 (mild): Secondary | ICD-10-CM

## 2023-03-07 LAB — URINALYSIS, ROUTINE W REFLEX MICROSCOPIC
Bilirubin Urine: NEGATIVE
Hgb urine dipstick: NEGATIVE
Ketones, ur: NEGATIVE
Leukocytes,Ua: NEGATIVE
Nitrite: NEGATIVE
RBC / HPF: NONE SEEN (ref 0–?)
Specific Gravity, Urine: 1.01 (ref 1.000–1.030)
Total Protein, Urine: NEGATIVE
Urine Glucose: >=1000 — AB
Urobilinogen, UA: 0.2 (ref 0.0–1.0)
pH: 7 (ref 5.0–8.0)

## 2023-03-07 LAB — BASIC METABOLIC PANEL
BUN: 16 mg/dL (ref 6–23)
CO2: 32 mEq/L (ref 19–32)
Calcium: 10.4 mg/dL (ref 8.4–10.5)
Chloride: 99 mEq/L (ref 96–112)
Creatinine, Ser: 1.3 mg/dL (ref 0.40–1.50)
GFR: 56.23 mL/min — ABNORMAL LOW (ref >60.00)
Glucose, Bld: 127 mg/dL — ABNORMAL HIGH (ref 70–99)
Potassium: 4.4 mEq/L (ref 3.5–5.1)
Sodium: 138 mEq/L (ref 135–145)

## 2023-03-07 LAB — HEMOGLOBIN A1C: Hgb A1c MFr Bld: 6.9 % — ABNORMAL HIGH (ref 4.6–6.5)

## 2023-03-07 LAB — MICROALBUMIN / CREATININE URINE RATIO
Creatinine,U: 85.6 mg/dL
Microalb Creat Ratio: 0.8 mg/g (ref 0.0–30.0)
Microalb, Ur: <0.7 mg/dL (ref 0.0–1.9)

## 2023-03-07 LAB — TROPONIN I (HIGH SENSITIVITY): High Sens Troponin I: 7 ng/L (ref 2–17)

## 2023-03-07 MED ORDER — ROSUVASTATIN CALCIUM 40 MG PO TABS
40.0000 mg | ORAL_TABLET | Freq: Every day | ORAL | 0 refills | Status: AC
Start: 2023-03-07 — End: ?

## 2023-03-07 MED ORDER — EMPAGLIFLOZIN 10 MG PO TABS
10.0000 mg | ORAL_TABLET | Freq: Every day | ORAL | 0 refills | Status: DC
Start: 2023-03-07 — End: 2023-03-12

## 2023-03-07 NOTE — Patient Instructions (Signed)
Hypertension, Adult High blood pressure (hypertension) is when the force of blood pumping through the arteries is too strong. The arteries are the blood vessels that carry blood from the heart throughout the body. Hypertension forces the heart to work harder to pump blood and may cause arteries to become narrow or stiff. Untreated or uncontrolled hypertension can lead to a heart attack, heart failure, a stroke, kidney disease, and other problems. A blood pressure reading consists of a higher number over a lower number. Ideally, your blood pressure should be below 120/80. The first ("top") number is called the systolic pressure. It is a measure of the pressure in your arteries as your heart beats. The second ("bottom") number is called the diastolic pressure. It is a measure of the pressure in your arteries as the heart relaxes. What are the causes? The exact cause of this condition is not known. There are some conditions that result in high blood pressure. What increases the risk? Certain factors may make you more likely to develop high blood pressure. Some of these risk factors are under your control, including: Smoking. Not getting enough exercise or physical activity. Being overweight. Having too much fat, sugar, calories, or salt (sodium) in your diet. Drinking too much alcohol. Other risk factors include: Having a personal history of heart disease, diabetes, high cholesterol, or kidney disease. Stress. Having a family history of high blood pressure and high cholesterol. Having obstructive sleep apnea. Age. The risk increases with age. What are the signs or symptoms? High blood pressure may not cause symptoms. Very high blood pressure (hypertensive crisis) may cause: Headache. Fast or irregular heartbeats (palpitations). Shortness of breath. Nosebleed. Nausea and vomiting. Vision changes. Severe chest pain, dizziness, and seizures. How is this diagnosed? This condition is diagnosed by  measuring your blood pressure while you are seated, with your arm resting on a flat surface, your legs uncrossed, and your feet flat on the floor. The cuff of the blood pressure monitor will be placed directly against the skin of your upper arm at the level of your heart. Blood pressure should be measured at least twice using the same arm. Certain conditions can cause a difference in blood pressure between your right and left arms. If you have a high blood pressure reading during one visit or you have normal blood pressure with other risk factors, you may be asked to: Return on a different day to have your blood pressure checked again. Monitor your blood pressure at home for 1 week or longer. If you are diagnosed with hypertension, you may have other blood or imaging tests to help your health care provider understand your overall risk for other conditions. How is this treated? This condition is treated by making healthy lifestyle changes, such as eating healthy foods, exercising more, and reducing your alcohol intake. You may be referred for counseling on a healthy diet and physical activity. Your health care provider may prescribe medicine if lifestyle changes are not enough to get your blood pressure under control and if: Your systolic blood pressure is above 130. Your diastolic blood pressure is above 80. Your personal target blood pressure may vary depending on your medical conditions, your age, and other factors. Follow these instructions at home: Eating and drinking  Eat a diet that is high in fiber and potassium, and low in sodium, added sugar, and fat. An example of this eating plan is called the DASH diet. DASH stands for Dietary Approaches to Stop Hypertension. To eat this way: Eat   plenty of fresh fruits and vegetables. Try to fill one half of your plate at each meal with fruits and vegetables. Eat whole grains, such as whole-wheat pasta, brown rice, or whole-grain bread. Fill about one  fourth of your plate with whole grains. Eat or drink low-fat dairy products, such as skim milk or low-fat yogurt. Avoid fatty cuts of meat, processed or cured meats, and poultry with skin. Fill about one fourth of your plate with lean proteins, such as fish, chicken without skin, beans, eggs, or tofu. Avoid pre-made and processed foods. These tend to be higher in sodium, added sugar, and fat. Reduce your daily sodium intake. Many people with hypertension should eat less than 1,500 mg of sodium a day. Do not drink alcohol if: Your health care provider tells you not to drink. You are pregnant, may be pregnant, or are planning to become pregnant. If you drink alcohol: Limit how much you have to: 0-1 drink a day for women. 0-2 drinks a day for men. Know how much alcohol is in your drink. In the [place] one drink equals one 12 oz bottle of beer (355 mL), one 5 oz glass of wine (148 mL), or one 1 oz glass of hard liquor (44 mL). Lifestyle  Work with your health care provider to maintain a healthy body weight or to lose weight. Ask what an ideal weight is for you. Get at least 30 minutes of exercise that causes your heart to beat faster (aerobic exercise) most days of the week. Activities may include walking, swimming, or biking. Include exercise to strengthen your muscles (resistance exercise), such as Pilates or lifting weights, as part of your weekly exercise routine. Try to do these types of exercises for 30 minutes at least 3 days a week. Do not use any products that contain nicotine or tobacco. These products include cigarettes, chewing tobacco, and vaping devices, such as e-cigarettes. If you need help quitting, ask your health care provider. Monitor your blood pressure at home as told by your health care provider. Keep all follow-up visits. This is important. Medicines Take over-the-counter and prescription medicines only as told by your health care provider. Follow directions carefully. Blood  pressure medicines must be taken as prescribed. Do not skip doses of blood pressure medicine. Doing this puts you at risk for problems and can make the medicine less effective. Ask your health care provider about side effects or reactions to medicines that you should watch for. Contact a health care provider if you: Think you are having a reaction to a medicine you are taking. Have headaches that keep coming back (recurring). Feel dizzy. Have swelling in your ankles. Have trouble with your vision. Get help right away if you: Develop a severe headache or confusion. Have unusual weakness or numbness. Feel faint. Have severe pain in your chest or abdomen. Vomit repeatedly. Have trouble breathing. These symptoms may be an emergency. Get help right away. Call 911. Do not wait to see if the symptoms will go away. Do not drive yourself to the hospital. Summary Hypertension is when the force of blood pumping through your arteries is too strong. If this condition is not controlled, it may put you at risk for serious complications. Your personal target blood pressure may vary depending on your medical conditions, your age, and other factors. For most people, a normal blood pressure is less than 120/80. Hypertension is treated with lifestyle changes, medicines, or a combination of both. Lifestyle changes include losing weight, eating a healthy,   low-sodium diet, exercising more, and limiting alcohol. This information is not intended to replace advice given to you by your health care provider. Make sure you discuss any questions you have with your health care provider. Document Revised: 06/21/2021 Document Reviewed: 06/21/2021 [employer] Patient Education  2024 [employer]  

## 2023-03-07 NOTE — Progress Notes (Signed)
Subjective:  Patient ID: Brandon Shannon, male    DOB: 01-Feb-1956  Age: 67 y.o. MRN: [id number]  CC: Hypertension, Hyperlipidemia, and Diabetes   HPI Fread A Buer presents for f/up ----  Discussed the use of AI scribe software for clinical note transcription with the patient, who gave verbal consent to proceed.  History of Present Illness   The patient has been taking various vitamins including B12, vitamin D, and calcium, along with Prevagen. He reports feeling generally well and has been engaging in regular exercise and weight loss efforts. He denies experiencing chest pain or shortness of breath during physical activity. However, he does report occasional episodes of cold, clammy sweats, which he suspects may be related to his medication. He also notes occasional slight dizziness, particularly when moving or standing up quickly, which he also attributes to his medication. The patient has been receiving eye care through the [facility], with his most recent exam earlier in the year and another scheduled for the following month. He plays golf and has good endurance.       Outpatient Medications Prior to Visit  Medication Sig Dispense Refill   ALPRAZolam (XANAX) 0.5 MG tablet Take 0.5 mg by mouth daily as needed for anxiety.     amoxicillin (AMOXIL) 500 MG tablet Take 500 mg by mouth 2 (two) times daily.     carvedilol (COREG) 12.5 MG tablet Take 1 tablet (12.5 mg total) by mouth 2 (two) times daily with a meal. 180 tablet 1   cetirizine (ZYRTEC) 10 MG tablet Take 1 tablet by mouth daily.     Cholecalciferol 50 MCG (2000 UT) TABS Take 1 tablet (2,000 Units total) by mouth daily. 90 tablet 1   fluticasone (FLONASE) 50 MCG/ACT nasal spray INSTILL 2 SPRAYS IN EACH NOSTRIL TWICE A DAY     gabapentin (NEURONTIN) 100 MG capsule Take 100 mg by mouth 2 (two) times daily.     Omeprazole 20 MG TBEC TAKE 1 CAPSULE BY MOUTH IN THE MORNING AS NEEDED     scopolamine (TRANSDERM-SCOP) 1 MG/3DAYS Place 1 patch  (1.5 mg total) onto the skin every 3 (three) days. 4 patch 0   sildenafil (VIAGRA) 100 MG tablet TAKE ONE TABLET BY MOUTH AS INSTRUCTED AS NEEDED (TAKE 1 HOUR PRIOR TO SEXUAL ACTIVITY *DO NOT EXCEED 1 DOSE PER 24 HOUR PERIOD*)     tamsulosin (FLOMAX) 0.4 MG CAPS capsule Take 1 capsule (0.4 mg total) by mouth daily after supper. 90 capsule 1   losartan-hydrochlorothiazide (HYZAAR) 50-12.5 MG tablet Take 1 tablet by mouth daily. 90 tablet 1   rosuvastatin (CRESTOR) 40 MG tablet Take 40 mg by mouth daily.     No facility-administered medications prior to visit.    ROS Review of Systems  Constitutional:  Negative for chills, diaphoresis, fatigue and fever.  HENT: Negative.    Eyes: Negative.   Respiratory:  Negative for cough, chest tightness, shortness of breath and wheezing.   Cardiovascular:  Negative for chest pain, palpitations and leg swelling.  Gastrointestinal:  Negative for abdominal pain, constipation, diarrhea, nausea and vomiting.  Endocrine: Negative.   Genitourinary: Negative.  Negative for difficulty urinating and dysuria.  Musculoskeletal:  Negative for arthralgias and myalgias.  Skin: Negative.   Neurological:  Negative for dizziness, weakness, light-headedness and numbness.  Hematological:  Negative for adenopathy. Does not bruise/bleed easily.  Psychiatric/Behavioral: Negative.      Objective:  BP 118/62 (BP Location: Right Arm, Patient Position: Sitting, Cuff Size: Large)   Pulse  74   Temp 98.1 F (36.7 C) (Oral)   Ht 5\' 11"  (1.803 m)   Wt 222 lb (100.7 kg)   SpO2 95%   BMI 30.96 kg/m   BP Readings from Last 3 Encounters:  03/07/23 118/62  10/31/22 134/80  08/01/22 136/86    Wt Readings from Last 3 Encounters:  03/07/23 222 lb (100.7 kg)  10/31/22 230 lb (104.3 kg)  08/01/22 235 lb (106.6 kg)    Physical Exam Vitals reviewed.  Constitutional:      Appearance: Normal appearance.  HENT:     Nose: Nose normal.     Mouth/Throat:     Mouth: Mucous  membranes are moist.  Eyes:     General: No scleral icterus.    Conjunctiva/sclera: Conjunctivae normal.  Cardiovascular:     Rate and Rhythm: Normal rate and regular rhythm.     Heart sounds: Normal heart sounds, S1 normal and S2 normal. No murmur heard.    No friction rub. No gallop.     Comments: EKG- NSR, 76 bpm ?LAE Low voltage  Septal infarct pattern is old No LVH or acute ST/T wave changes Pulmonary:     Effort: Pulmonary effort is normal.     Breath sounds: No stridor. No wheezing, rhonchi or rales.  Abdominal:     General: Abdomen is flat.     Palpations: There is no mass.     Tenderness: There is no abdominal tenderness. There is no guarding.     Hernia: No hernia is present.  Musculoskeletal:        General: Normal range of motion.     Cervical back: Neck supple.     Right lower leg: No edema.     Left lower leg: No edema.  Skin:    General: Skin is warm and dry.  Neurological:     General: No focal deficit present.     Mental Status: He is alert. Mental status is at baseline.  Psychiatric:        Mood and Affect: Mood normal.        Behavior: Behavior normal.     Lab Results  Component Value Date   WBC 8.6 10/19/2022   HGB 15.5 10/19/2022   HCT 44 10/19/2022   PLT 185 10/19/2022   GLUCOSE 127 (H) 03/07/2023   CHOL 124 10/19/2022   TRIG 93 10/19/2022   HDL 55 10/19/2022   LDLCALC 50 10/19/2022   ALT 25 10/19/2022   AST 19 10/19/2022   NA 138 03/07/2023   K 4.4 03/07/2023   CL 99 03/07/2023   CREATININE 1.30 03/07/2023   BUN 16 03/07/2023   CO2 32 03/07/2023   TSH 1.30 08/01/2022   PSA 0.41 08/01/2022   INR 1.14 08/28/2016   HGBA1C 6.9 (H) 03/07/2023   MICROALBUR <0.7 03/07/2023    CT Angio Chest/Abd/Pel for Dissection W and/or Wo Contrast  Result Date: 01/14/2022 CLINICAL DATA:  Severe chest pain radiating to back. Suspected aortic dissection. EXAM: CT ANGIOGRAPHY CHEST, ABDOMEN AND PELVIS TECHNIQUE: Non-contrast CT of the chest was  initially obtained. Multidetector CT imaging through the chest, abdomen and pelvis was performed using the standard protocol during bolus administration of intravenous contrast. Multiplanar reconstructed images and MIPs were obtained and reviewed to evaluate the vascular anatomy. RADIATION DOSE REDUCTION: This exam was performed according to the departmental dose-optimization program which includes automated exposure control, adjustment of the mA and/or kV according to patient size and/or use of iterative reconstruction technique. CONTRAST:  OMNIPAQUE IOHEXOL 350 MG/ML SOLN COMPARISON:  AP only CT on 06/27/2009 FINDINGS: CTA CHEST FINDINGS Cardiovascular: No evidence of thoracic aortic dissection or aneurysm. No pulmonary embolism identified. Mediastinum/Nodes: No masses or pathologically enlarged lymph nodes identified. Lungs/Pleura: No pulmonary mass, infiltrate, or effusion. Musculoskeletal: No suspicious bone lesions identified. Review of the MIP images confirms the above findings. CTA ABDOMEN AND PELVIS FINDINGS VASCULAR Aorta: No aortic aneurysm or dissection. Celiac: No atherosclerotic plaque or stenosis. SMA: No atherosclerotic plaque or stenosis. Renals: No atherosclerotic plaque or stenosis. IMA: Patent Inflow: Unremarkable Veins: Unremarkable Review of the MIP images confirms the above findings. NON-VASCULAR Hepatobiliary: No hepatic masses identified. Gallbladder is unremarkable. No evidence of biliary ductal dilatation. Pancreas:  No mass or inflammatory changes. Spleen: Within normal limits in size and appearance. Adrenals/Urinary Tract: No masses identified. No evidence of ureteral calculi or hydronephrosis. Stomach/Bowel: No evidence of obstruction, inflammatory process or abnormal fluid collections. Vascular/Lymphatic: No pathologically enlarged lymph nodes. No acute vascular findings. Reproductive:  No mass or other significant abnormality. Other:  None. Musculoskeletal:  No suspicious bone  lesions identified. Review of the MIP images confirms the above findings. IMPRESSION: No evidence of aortic dissection, aneurysm, or other acute findings. Electronically Signed   By: Danae Orleans M.D.   On: 01/14/2022 12:32    Assessment & Plan:   Essential hypertension- His blood pressure is very well-controlled. -     Basic metabolic panel; Future -     Urinalysis, Routine w reflex microscopic; Future -     EKG 12-Lead  Type II diabetes mellitus with manifestations (HCC) -     Microalbumin / creatinine urine ratio; Future -     Basic metabolic panel; Future -     Urinalysis, Routine w reflex microscopic; Future -     Hemoglobin A1c; Future -     Empagliflozin; Take 1 tablet (10 mg total) by mouth daily before breakfast.  Dispense: 90 tablet; Refill: 0  Abnormal electrocardiogram (ECG) (EKG)- Will evaluate with an echocardiogram. -     ECHOCARDIOGRAM COMPLETE; Future -     Troponin I (High Sensitivity); Future  Hyperlipidemia with target LDL less than 100 - LDL goal achieved. Doing well on the statin  -     Lipoprotein A (LPA); Future -     Rosuvastatin Calcium; Take 1 tablet (40 mg total) by mouth daily.  Dispense: 90 tablet; Refill: 0  Chronic renal disease, stage 2, mildly decreased glomerular filtration rate (GFR) between 60-89 mL/min/1.73 square meter- Will start an SGLT2 inhibitor. -     Empagliflozin; Take 1 tablet (10 mg total) by mouth daily before breakfast.  Dispense: 90 tablet; Refill: 0     Follow-up: Return in about 3 months (around 06/07/2023).  Sanda Linger, MD

## 2023-03-08 ENCOUNTER — Telehealth: Payer: Self-pay

## 2023-03-08 LAB — LIPOPROTEIN A (LPA): Lipoprotein (a): 60 nmol/L (ref <75)

## 2023-03-08 NOTE — Progress Notes (Signed)
   03/08/2023  Patient ID: Brandon Shannon, male   DOB: October 27, 1955, 67 y.o.   MRN: [id number]  Patient outreach to schedule telephone visit for medication review per PCP, Dr. Yetta Barre request.  Appointment scheduled for 7/30.  Lenna Gilford, PharmD, DPLA

## 2023-03-09 ENCOUNTER — Telehealth (HOSPITAL_BASED_OUTPATIENT_CLINIC_OR_DEPARTMENT_OTHER): Payer: Self-pay | Admitting: *Deleted

## 2023-03-09 ENCOUNTER — Encounter: Payer: Self-pay | Admitting: Internal Medicine

## 2023-03-09 NOTE — Telephone Encounter (Signed)
Left message for patient to call and schedule the Echocardiogram ordered by Dr. Thomas Jones 

## 2023-03-12 ENCOUNTER — Other Ambulatory Visit: Payer: Self-pay | Admitting: Internal Medicine

## 2023-03-27 ENCOUNTER — Other Ambulatory Visit: Payer: Medicare Other

## 2023-03-27 NOTE — Progress Notes (Signed)
03/27/2023 Name: Brandon Shannon MRN: [id number] DOB: April 07, 1956  Chief Complaint  Patient presents with   Medication Management   Brandon Shannon is a 67 y.o. year old male who presented for a telephone visit.   They were referred to the pharmacist by their PCP for assistance in managing diabetes, hypertension, and hyperlipidemia.   Subjective:  Care Team: Primary Care Provider: Etta Grandchild, MD ; Next Scheduled Visit: 11/14  Medication Access/Adherence  Current Pharmacy:  [facility] - [place], [place] - [address] [place] [address] Phone: [phone] Fax: [phone]  [facility] - [place], [place] - [address] [place] [address] Phone: [phone] Fax: [phone]  [facility] [phone] - [place], [place] - [address] AT CORNER OF [facility] [address] [place] [address] Phone: [phone] Fax: [phone]  Patient reports affordability concerns with their medications: No  Patient reports access/transportation concerns to their pharmacy: No  Patient reports adherence concerns with their medications:  No    Diabetes: Current medications: empagliflozin/metformin 12.5/1000mg  BID -Current glucose readings: FBG 112-130 -Patient denies hypoglycemic s/sx including dizziness, shakiness, sweating.  -Patient denies hyperglycemic symptoms including polyuria, polydipsia, polyphagia, nocturia, neuropathy, blurred vision. -Patient asking about metformin and kidney function, because he has been on this medication for quite sometime now.  Discussed safety of the medication with his current function, and informed him his kidney function has remained stable; so there is no need to stop or adjust metformin therapy at this time.  Hypertension: Current medications: carvedilol 12.5mg  BID -Patient does not have a validated, automated, upper arm home BP  cuff -Current blood pressure readings readings: 118/62 at last OV -Patient denies hypotensive s/sx including dizziness, lightheadedness.  -Patient denies hypertensive symptoms including headache, chest pain, shortness of breath -Counseled patient that meloxicam can increase BP, so limit use to only as needed  Hyperlipidemia/ASCVD Risk Reduction Current lipid lowering medications: rosuvastatin 40mg  daily Antiplatelet regimen: None  Objective: Lab Results  Component Value Date   HGBA1C 6.9 (H) 03/07/2023   Lab Results  Component Value Date   CREATININE 1.30 03/07/2023   BUN 16 03/07/2023   NA 138 03/07/2023   K 4.4 03/07/2023   CL 99 03/07/2023   CO2 32 03/07/2023   Lab Results  Component Value Date   CHOL 124 10/19/2022   HDL 55 10/19/2022   LDLCALC 50 10/19/2022   TRIG 93 10/19/2022   CHOLHDL 3 08/01/2022   Medications Reviewed Today     Reviewed by Brandon Shannon, RPH (Pharmacist) on 03/27/23 at [phone]  Med List Status: <None>   Medication Order Taking? Sig Documenting Provider Last Dose Status Informant  ALPRAZolam (XANAX) 0.5 MG tablet [id number] Yes Take 0.5 mg by mouth daily as needed for anxiety. [provider] Taking Active Self  Carboxymethylcellulose Sod PF 0.5 % SOLN [id number] Yes Apply 1 drop to eye in the morning, at noon, and at bedtime. [provider] Taking Active   carvedilol (COREG) 12.5 MG tablet [id number] Yes Take 1 tablet (12.5 mg total) by mouth 2 (two) times daily with a meal. Brandon Grandchild, MD Taking Active   cetirizine (ZYRTEC) 10 MG tablet [id number] Yes Take 1 tablet by mouth daily. [provider] Taking Active            Med Note Littie Deeds,  A   Tue Mar 27, 2023  9:11 AM) As  needed for allergies  cholecalciferol (VITAMIN D3) 25 MCG (1000 UNIT) tablet [id number] Yes Take 1,000 Units by mouth every other day. [provider] Taking Active   cyanocobalamin (VITAMIN B12) 1000 MCG tablet [id number] Yes Take  1,000 mcg by mouth every other day. [provider] Taking Active   Empagliflozin-metFORMIN HCl 12.12-998 MG TABS [id number] Yes Take 1 tablet by mouth 2 (two) times daily. [provider] Taking Active   fluticasone (FLONASE) 50 MCG/ACT nasal spray [id number] Yes INSTILL 2 SPRAYS IN EACH NOSTRIL TWICE A DAY [provider] Taking Active            Med Note Littie Deeds,  A   Tue Mar 27, 2023  9:12 AM) As needed   gabapentin (NEURONTIN) 300 MG capsule [id number] Yes Take 300 mg by mouth 3 (three) times daily. [provider] Taking Active   meloxicam (MOBIC) 15 MG tablet [id number] Yes Take 15 mg by mouth daily. As needed [provider] Taking Active   Omeprazole 20 MG TBEC [id number] Yes TAKE 1 CAPSULE BY MOUTH IN THE MORNING AS NEEDED [provider] Taking Active   rosuvastatin (CRESTOR) 40 MG tablet [id number] Yes Take 1 tablet (40 mg total) by mouth daily. Brandon Grandchild, MD Taking Active   sildenafil (VIAGRA) 100 MG tablet [id number] Yes TAKE ONE TABLET BY MOUTH AS INSTRUCTED AS NEEDED (TAKE 1 HOUR PRIOR TO SEXUAL ACTIVITY *DO NOT EXCEED 1 DOSE PER 24 HOUR PERIOD*) [provider] Taking Active   tamsulosin (FLOMAX) 0.4 MG CAPS capsule [id number] Yes Take 1 capsule (0.4 mg total) by mouth daily after supper. Brandon Grandchild, MD Taking Active Self           Assessment/Plan:   Diabetes: - Currently controlled - Continue current regimen and regular follow-up with providers  Hypertension: - Currently controlled - Continue current regimen and regular follow-up with providers  Hyperlipidemia/ASCVD Risk Reduction: - Currently controlled.  - Continue current regimen and regular follow-up with providers  Follow Up Plan: None scheduled but can as needed per PCP  Brandon Shannon, PharmD, DPLA

## 2023-03-31 ENCOUNTER — Encounter (HOSPITAL_BASED_OUTPATIENT_CLINIC_OR_DEPARTMENT_OTHER): Payer: Self-pay

## 2023-03-31 ENCOUNTER — Emergency Department (HOSPITAL_BASED_OUTPATIENT_CLINIC_OR_DEPARTMENT_OTHER)
Admission: EM | Admit: 2023-03-31 | Discharge: 2023-04-01 | Disposition: A | Discharge status: Home / Self Care | Payer: Medicare Other | Attending: Emergency Medicine | Admitting: Emergency Medicine

## 2023-03-31 DIAGNOSIS — I129 Hypertensive chronic kidney disease with stage 1 through stage 4 chronic kidney disease, or unspecified chronic kidney disease: Secondary | ICD-10-CM | POA: Diagnosis not present

## 2023-03-31 DIAGNOSIS — E1122 Type 2 diabetes mellitus with diabetic chronic kidney disease: Secondary | ICD-10-CM | POA: Insufficient documentation

## 2023-03-31 DIAGNOSIS — M545 Low back pain, unspecified: Secondary | ICD-10-CM | POA: Insufficient documentation

## 2023-03-31 DIAGNOSIS — N182 Chronic kidney disease, stage 2 (mild): Secondary | ICD-10-CM | POA: Diagnosis not present

## 2023-03-31 DIAGNOSIS — Z79899 Other long term (current) drug therapy: Secondary | ICD-10-CM | POA: Diagnosis not present

## 2023-03-31 DIAGNOSIS — M533 Sacrococcygeal disorders, not elsewhere classified: Secondary | ICD-10-CM | POA: Insufficient documentation

## 2023-03-31 MED ORDER — METHOCARBAMOL 1000 MG/10ML IJ SOLN
1000.0000 mg | Freq: Once | INTRAMUSCULAR | Status: AC
  Administered 2023-04-01: 1000 mg via INTRAVENOUS
  Filled 2023-03-31: qty 10

## 2023-03-31 MED ORDER — HYDROMORPHONE HCL 1 MG/ML IJ SOLN
0.5000 mg | Freq: Once | INTRAMUSCULAR | Status: AC
  Administered 2023-04-01: 0.5 mg via INTRAVENOUS
  Filled 2023-03-31: qty 1

## 2023-03-31 NOTE — ED Provider Notes (Signed)
[facility] Provider Note  CSN: [id number] Arrival date & time: 03/31/23 2305  Chief Complaint(s) Back Pain  HPI Brandon Shannon is a 67 y.o. male {Add pertinent medical, surgical, social history, OB history to HPI:1}    Back Pain   Past Medical History Past Medical History:  Diagnosis Date   AKI (acute kidney injury) (HCC)    Arthritis    "hands, knees" (08/28/2016)   CAP (community acquired pneumonia) 08/28/2016   GERD (gastroesophageal reflux disease)    Hyperlipidemia    Hypertension    Pneumonia ~ 2015   Seasonal allergies    Syncope    Syncope and collapse 08/28/2016   Type II diabetes mellitus (HCC)    Patient Active Problem List   Diagnosis Date Noted   Chronic renal disease, stage 2, mildly decreased glomerular filtration rate (GFR) between 60-89 mL/min/1.73 square meter 08/02/2022   Abnormal electrocardiogram (ECG) (EKG) 01/31/2022   Keloid 01/31/2022   Polyp of colon 08/05/2021   DDD (degenerative disc disease), lumbar 08/04/2021   Chronic right-sided low back pain without sciatica 08/02/2021   Irritable bowel syndrome with both constipation and diarrhea 07/18/2018   Gastroesophageal reflux disease without esophagitis 07/18/2018   BPH with obstruction/lower urinary tract symptoms 09/07/2016   Screening for colon cancer 09/17/2014   GAD (generalized anxiety disorder) 03/04/2014   Vitamin D deficiency 04/02/2013   Type II diabetes mellitus with manifestations (HCC) 09/18/2007   Hyperlipidemia with target LDL less than 100 04/01/2007   Obesity 04/01/2007   Essential hypertension 04/01/2007   Home Medication(s) Prior to Admission medications   Medication Sig Start Date End Date Taking? Authorizing Provider  ALPRAZolam Prudy Feeler) 0.5 MG tablet Take 0.5 mg by mouth daily as needed for anxiety.    [provider]  Carboxymethylcellulose Sod PF 0.5 % SOLN Apply 1 drop to eye in the morning, at noon, and at bedtime.     [provider]  carvedilol (COREG) 12.5 MG tablet Take 1 tablet (12.5 mg total) by mouth 2 (two) times daily with a meal. 12/31/18   Etta Grandchild, MD  cetirizine (ZYRTEC) 10 MG tablet Take 1 tablet by mouth daily. 09/21/21   [provider]  cholecalciferol (VITAMIN D3) 25 MCG (1000 UNIT) tablet Take 1,000 Units by mouth every other day.    [provider]  cyanocobalamin (VITAMIN B12) 1000 MCG tablet Take 1,000 mcg by mouth every other day.    [provider]  Empagliflozin-metFORMIN HCl 12.12-998 MG TABS Take 1 tablet by mouth 2 (two) times daily.    [provider]  fluticasone (FLONASE) 50 MCG/ACT nasal spray INSTILL 2 SPRAYS IN EACH NOSTRIL TWICE A DAY 10/14/21   [provider]  gabapentin (NEURONTIN) 300 MG capsule Take 300 mg by mouth 3 (three) times daily. 10/19/22   [provider]  meloxicam (MOBIC) 15 MG tablet Take 15 mg by mouth daily. As needed 10/19/22   [provider]  Omeprazole 20 MG TBEC TAKE 1 CAPSULE BY MOUTH IN THE MORNING AS NEEDED 01/12/20   [provider]  rosuvastatin (CRESTOR) 40 MG tablet Take 1 tablet (40 mg total) by mouth daily. 03/07/23   Etta Grandchild, MD  sildenafil (VIAGRA) 100 MG tablet TAKE ONE TABLET BY MOUTH AS INSTRUCTED AS NEEDED (TAKE 1 HOUR PRIOR TO SEXUAL ACTIVITY *DO NOT EXCEED 1 DOSE PER 24 HOUR PERIOD*) 09/21/21   [provider]  tamsulosin (FLOMAX) 0.4 MG CAPS capsule Take 1 capsule (  0.4 mg total) by mouth daily after supper. 09/07/16   Etta Grandchild, MD                                                                                                                                    Allergies Atorvastatin and Pravastatin  Review of Systems Review of Systems  Musculoskeletal:  Positive for back pain.   As noted in HPI  Physical Exam Vital Signs  I have reviewed the triage vital signs BP (!) 141/92 (BP Location: Right Arm)   Pulse 76   Temp 97.6 F  (36.4 C) (Oral)   Resp 18   Ht 5\' 11"  (1.803 m)   Wt 97.5 kg   SpO2 99%   BMI 29.99 kg/m  *** Physical Exam  ED Results and Treatments Labs (all labs ordered are listed, but only abnormal results are displayed) Labs Reviewed - No data to display                                                                                                                       EKG  EKG Interpretation Date/Time:    Ventricular Rate:    PR Interval:    QRS Duration:    QT Interval:    QTC Calculation:   R Axis:      Text Interpretation:         Radiology No results found.  Medications Ordered in ED Medications - No data to display Procedures Procedures  (including critical care time) Medical Decision Making / ED Course   Medical Decision Making   ***    Final Clinical Impression(s) / ED Diagnoses Final diagnoses:  None    This chart was dictated using voice recognition software.  Despite best efforts to proofread,  errors can occur which can change the documentation meaning.

## 2023-03-31 NOTE — ED Triage Notes (Signed)
Pt POV from home reporting lower back pain that shoots down both hips since last night that worsened tonight. Hx sciatica, took meloxican, tylenol and tried other pain relief methods with no relief.

## 2023-04-01 ENCOUNTER — Emergency Department (HOSPITAL_BASED_OUTPATIENT_CLINIC_OR_DEPARTMENT_OTHER): Payer: Medicare Other | Admitting: Radiology

## 2023-04-01 DIAGNOSIS — M545 Low back pain, unspecified: Secondary | ICD-10-CM | POA: Diagnosis not present

## 2023-04-01 LAB — CBC WITH DIFFERENTIAL/PLATELET
Abs Immature Granulocytes: 0.02 10*3/uL (ref 0.00–0.07)
Basophils Absolute: 0 10*3/uL (ref 0.0–0.1)
Basophils Relative: 1 %
Eosinophils Absolute: 0.2 10*3/uL (ref 0.0–0.5)
Eosinophils Relative: 3 %
HCT: 44.6 % (ref 39.0–52.0)
Hemoglobin: 15.3 g/dL (ref 13.0–17.0)
Immature Granulocytes: 0 %
Lymphocytes Relative: 38 %
Lymphs Abs: 2.8 10*3/uL (ref 0.7–4.0)
MCH: 31.4 pg (ref 26.0–34.0)
MCHC: 34.3 g/dL (ref 30.0–36.0)
MCV: 91.6 fL (ref 80.0–100.0)
Monocytes Absolute: 0.7 10*3/uL (ref 0.1–1.0)
Monocytes Relative: 9 %
Neutro Abs: 3.7 10*3/uL (ref 1.7–7.7)
Neutrophils Relative %: 49 %
Platelets: 167 10*3/uL (ref 150–400)
RBC: 4.87 MIL/uL (ref 4.22–5.81)
RDW: 12.7 % (ref 11.5–15.5)
WBC: 7.3 10*3/uL (ref 4.0–10.5)
nRBC: 0 % (ref 0.0–0.2)

## 2023-04-01 LAB — CBG MONITORING, ED: Glucose-Capillary: 110 mg/dL — ABNORMAL HIGH (ref 70–99)

## 2023-04-01 LAB — COMPREHENSIVE METABOLIC PANEL WITH GFR
ALT: 16 U/L (ref 0–44)
AST: 20 U/L (ref 15–41)
Albumin: 4.2 g/dL (ref 3.5–5.0)
Alkaline Phosphatase: 63 U/L (ref 38–126)
Anion gap: 9 (ref 5–15)
BUN: 23 mg/dL (ref 8–23)
CO2: 24 mmol/L (ref 22–32)
Calcium: 9.5 mg/dL (ref 8.9–10.3)
Chloride: 105 mmol/L (ref 98–111)
Creatinine, Ser: 1.09 mg/dL (ref 0.61–1.24)
GFR, Estimated: >60 mL/min (ref >60)
Glucose, Bld: 112 mg/dL — ABNORMAL HIGH (ref 70–99)
Potassium: 4.2 mmol/L (ref 3.5–5.1)
Sodium: 138 mmol/L (ref 135–145)
Total Bilirubin: 0.5 mg/dL (ref 0.3–1.2)
Total Protein: 7.2 g/dL (ref 6.5–8.1)

## 2023-04-01 MED ORDER — DEXAMETHASONE SODIUM PHOSPHATE 10 MG/ML IJ SOLN
8.0000 mg | Freq: Once | INTRAMUSCULAR | Status: AC
  Administered 2023-04-01: 8 mg via INTRAVENOUS
  Filled 2023-04-01: qty 1

## 2023-04-01 MED ORDER — DEXAMETHASONE 4 MG PO TABS: ORAL_TABLET | ORAL | 0 refills | Status: AC

## 2023-04-01 MED ORDER — ACETAMINOPHEN 500 MG PO TABS
1000.0000 mg | ORAL_TABLET | Freq: Once | ORAL | Status: AC
  Administered 2023-04-01: 1000 mg via ORAL
  Filled 2023-04-01: qty 2

## 2023-04-01 MED ORDER — KETOROLAC TROMETHAMINE 15 MG/ML IJ SOLN
15.0000 mg | Freq: Once | INTRAMUSCULAR | Status: AC
  Administered 2023-04-01: 15 mg via INTRAVENOUS
  Filled 2023-04-01: qty 1

## 2023-04-01 MED ORDER — DEXAMETHASONE 4 MG PO TABS: ORAL_TABLET | ORAL | 0 refills | Status: DC

## 2023-04-01 NOTE — ED Notes (Signed)
 RN reviewed discharge instructions with pt. Pt verbalized understanding and had no further questions. VSS upon discharge.  

## 2023-04-04 ENCOUNTER — Ambulatory Visit (INDEPENDENT_AMBULATORY_CARE_PROVIDER_SITE_OTHER): Payer: Medicare Other

## 2023-04-04 DIAGNOSIS — R9431 Abnormal electrocardiogram [ECG] [EKG]: Secondary | ICD-10-CM

## 2023-04-04 LAB — ECHOCARDIOGRAM COMPLETE
Area-P 1/2: 3.37 cm2
S' Lateral: 1.94 cm

## 2023-04-05 ENCOUNTER — Encounter: Payer: Self-pay | Admitting: Internal Medicine

## 2023-04-05 ENCOUNTER — Ambulatory Visit (INDEPENDENT_AMBULATORY_CARE_PROVIDER_SITE_OTHER): Payer: Medicare Other | Admitting: Internal Medicine

## 2023-04-05 VITALS — BP 136/82 | HR 71 | Temp 98.2°F | Ht 71.0 in | Wt 219.0 lb

## 2023-04-05 DIAGNOSIS — I5032 Chronic diastolic (congestive) heart failure: Secondary | ICD-10-CM | POA: Insufficient documentation

## 2023-04-05 DIAGNOSIS — M5136 Other intervertebral disc degeneration, lumbar region: Secondary | ICD-10-CM | POA: Diagnosis not present

## 2023-04-05 DIAGNOSIS — M5416 Radiculopathy, lumbar region: Secondary | ICD-10-CM | POA: Diagnosis not present

## 2023-04-05 DIAGNOSIS — R292 Abnormal reflex: Secondary | ICD-10-CM

## 2023-04-05 NOTE — Patient Instructions (Signed)
Diastolic dysfunction: This is a technical measurement that tells [place] how well your heart relaxes. The muscle must relax well before it contracts to be the most efficient. If it does not relax well enough, it can lead to fluid gains and shortness of breath. This finding is used in conjunction with a patient's clinical picture. Just because a patient has diastolic dysfunction noted on their echocardiogram, it does not necessarily mean that they have congestive heart failure. If your provider felt that a finding of diastolic dysfunction is significant for you, he/she would indicate this.

## 2023-04-05 NOTE — Progress Notes (Signed)
Subjective:  Patient ID: Brandon Shannon, male    DOB: 1956/08/22  Age: 67 y.o. MRN: [id number]  CC: Back Pain, Congestive Heart Failure, Diabetes, and Hypertension   HPI Brandon Shannon presents for f/up ---  He complains of worsening LBP that radiates into BLE. He was seen in the ED and was treated. The pain is so severe that he has trouble sleeping and standing.  Outpatient Medications Prior to Visit  Medication Sig Dispense Refill   ALPRAZolam (XANAX) 0.5 MG tablet Take 0.5 mg by mouth daily as needed for anxiety.     Carboxymethylcellulose Sod PF 0.5 % SOLN Apply 1 drop to eye in the morning, at noon, and at bedtime.     carvedilol (COREG) 12.5 MG tablet Take 1 tablet (12.5 mg total) by mouth 2 (two) times daily with a meal. 180 tablet 1   cetirizine (ZYRTEC) 10 MG tablet Take 1 tablet by mouth daily.     cholecalciferol (VITAMIN D3) 25 MCG (1000 UNIT) tablet Take 1,000 Units by mouth every other day.     cyanocobalamin (VITAMIN B12) 1000 MCG tablet Take 1,000 mcg by mouth every other day.     dexamethasone (DECADRON) 4 MG tablet Take 4 mg every other day as needed for severe pain, not controlled with usual medication 10 tablet 0   Empagliflozin-metFORMIN HCl 12.12-998 MG TABS Take 1 tablet by mouth 2 (two) times daily.     fluticasone (FLONASE) 50 MCG/ACT nasal spray INSTILL 2 SPRAYS IN EACH NOSTRIL TWICE A DAY     gabapentin (NEURONTIN) 300 MG capsule Take 300 mg by mouth 3 (three) times daily.     meloxicam (MOBIC) 15 MG tablet Take 15 mg by mouth daily. As needed     Omeprazole 20 MG TBEC TAKE 1 CAPSULE BY MOUTH IN THE MORNING AS NEEDED     rosuvastatin (CRESTOR) 40 MG tablet Take 1 tablet (40 mg total) by mouth daily. 90 tablet 0   sildenafil (VIAGRA) 100 MG tablet TAKE ONE TABLET BY MOUTH AS INSTRUCTED AS NEEDED (TAKE 1 HOUR PRIOR TO SEXUAL ACTIVITY *DO NOT EXCEED 1 DOSE PER 24 HOUR PERIOD*)     tamsulosin (FLOMAX) 0.4 MG CAPS capsule Take 1 capsule (0.4 mg total) by mouth daily  after supper. 90 capsule 1   No facility-administered medications prior to visit.    ROS Review of Systems  Constitutional: Negative.  Negative for diaphoresis and fatigue.  HENT: Negative.    Eyes: Negative.   Respiratory:  Negative for cough, chest tightness, shortness of breath and wheezing.   Cardiovascular:  Negative for chest pain, palpitations and leg swelling.  Gastrointestinal:  Negative for abdominal pain, constipation, diarrhea, nausea and vomiting.  Endocrine: Negative.   Genitourinary: Negative.  Negative for difficulty urinating.  Musculoskeletal:  Positive for back pain. Negative for arthralgias, joint swelling and myalgias.  Skin: Negative.   Neurological:  Positive for weakness. Negative for numbness.  Hematological:  Negative for adenopathy. Does not bruise/bleed easily.  Psychiatric/Behavioral: Negative.      Objective:  BP 136/82 (BP Location: Right Arm, Patient Position: Sitting, Cuff Size: Large)   Pulse 71   Temp 98.2 F (36.8 C) (Oral)   Ht 5\' 11"  (1.803 m)   Wt 219 lb (99.3 kg)   SpO2 94%   BMI 30.54 kg/m   BP Readings from Last 3 Encounters:  04/05/23 136/82  04/01/23 127/76  03/07/23 118/62    Wt Readings from Last 3 Encounters:  04/05/23 219 lb (  99.3 kg)  03/31/23 215 lb (97.5 kg)  03/07/23 222 lb (100.7 kg)    Physical Exam Vitals reviewed.  Constitutional:      Appearance: Normal appearance.  HENT:     Nose: Nose normal.     Mouth/Throat:     Mouth: Mucous membranes are moist.  Eyes:     General: No scleral icterus.    Conjunctiva/sclera: Conjunctivae normal.  Cardiovascular:     Rate and Rhythm: Normal rate and regular rhythm.     Heart sounds: No murmur heard. Pulmonary:     Effort: Pulmonary effort is normal.     Breath sounds: No wheezing, rhonchi or rales.  Abdominal:     General: Abdomen is flat.     Palpations: There is no mass.     Tenderness: There is no abdominal tenderness. There is no guarding.     Hernia: No  hernia is present.  Musculoskeletal:     Lumbar back: No tenderness or bony tenderness. Decreased range of motion. Negative right straight leg raise test and negative left straight leg raise test.     Right lower leg: No edema.     Left lower leg: No edema.  Skin:    General: Skin is warm and dry.  Neurological:     General: No focal deficit present.     Mental Status: He is alert. Mental status is at baseline.     Cranial Nerves: Cranial nerves 2-12 are intact.     Motor: Weakness present.     Coordination: Coordination is intact.     Gait: Gait is intact. Gait normal.     Deep Tendon Reflexes: Reflexes abnormal.     Reflex Scores:      Tricep reflexes are 1+ on the right side and 1+ on the left side.      Bicep reflexes are 1+ on the right side and 1+ on the left side.      Brachioradialis reflexes are 1+ on the right side and 1+ on the left side.      Patellar reflexes are 0 on the right side and 0 on the left side.      Achilles reflexes are 0 on the right side and 0 on the left side.    Comments: Weakness in BLE  Psychiatric:        Mood and Affect: Mood normal.     Lab Results  Component Value Date   WBC 7.3 04/01/2023   HGB 15.3 04/01/2023   HCT 44.6 04/01/2023   PLT 167 04/01/2023   GLUCOSE 112 (H) 04/01/2023   CHOL 124 10/19/2022   TRIG 93 10/19/2022   HDL 55 10/19/2022   LDLCALC 50 10/19/2022   ALT 16 04/01/2023   AST 20 04/01/2023   NA 138 04/01/2023   K 4.2 04/01/2023   CL 105 04/01/2023   CREATININE 1.09 04/01/2023   BUN 23 04/01/2023   CO2 24 04/01/2023   TSH 1.30 08/01/2022   PSA 0.41 08/01/2022   INR 1.14 08/28/2016   HGBA1C 6.9 (H) 03/07/2023   MICROALBUR <0.7 03/07/2023    DG Lumbar Spine 2-3 Views  Result Date: 04/01/2023 CLINICAL DATA:  Low back pain EXAM: LUMBAR SPINE - 2-3 VIEW COMPARISON:  08/02/2021 FINDINGS: Grade 1 anterolisthesis L4-5 and minimal retrolisthesis L5-S1, likely degenerative, appears stable since prior examination.  Otherwise normal lumbar lordosis. No acute fracture of the lumbar spine. Vertebral body height is preserved. Intervertebral disc space narrowing is seen at L4-5 and L5-S1,  stable since prior examination, in keeping with changes of degenerative disc disease. Remaining intervertebral disc spaces are preserved. Facet arthrosis at L4-S1 is not well profiled on this examination. Paraspinal soft tissues are unremarkable. IMPRESSION: 1. Degenerative disc and degenerative joint disease at L4-5 and L5-S1, stable since prior examination. 2. No acute fracture or listhesis. Electronically Signed   By: Helyn Numbers M.D.   On: 04/01/2023 00:30   ECHOCARDIOGRAM COMPLETE  Result Date: 04/04/2023    ECHOCARDIOGRAM REPORT   Patient Name:   Brandon Shannon Date of Exam: 04/04/2023 Medical Rec #:  [id number]      Height:       71.0 in Accession #:    [id number]     Weight:       215.0 lb Date of Birth:  1956/05/21      BSA:          2.174 m Patient Age:    67 years       BP:           125/70 mmHg Patient Gender: M              HR:           67 bpm. Exam Location:  Outpatient Procedure: 2D Echo, 3D Echo, Cardiac Doppler, Color Doppler and Strain Analysis Indications:    R94.31 Abnormal EKG  History:        Patient has prior history of Echocardiogram examinations, most                 recent 08/30/2016. Abnormal ECG; Risk Factors:Hypertension,                 Diabetes, Non-Smoker and Dyslipidemia. Chronic renal disease.  Sonographer:    Jeryl Columbia RDCS Referring Phys: [phone] Etta Grandchild IMPRESSIONS  1. Left ventricular ejection fraction, by estimation, is 60 to 65%. Left ventricular ejection fraction by 3D volume is 60 %. The left ventricle has normal function. The left ventricle has no regional wall motion abnormalities. There is mild concentric left ventricular hypertrophy. Left ventricular diastolic parameters are consistent with Grade I diastolic dysfunction (impaired relaxation). The average left ventricular global longitudinal  strain is -20.3 %. The global longitudinal strain is normal.  2. Right ventricular systolic function is mildly reduced. The right ventricular size is normal.  3. The mitral valve is normal in structure. Mild mitral valve regurgitation. No evidence of mitral stenosis.  4. The aortic valve is normal in structure. Aortic valve regurgitation is not visualized. No aortic stenosis is present.  5. The pulmonic valve was doming. Pulmonic valve regurgitation is moderate to severe.  6. The inferior vena cava is normal in size with greater than 50% respiratory variability, suggesting right atrial pressure of 3 mmHg. FINDINGS  Left Ventricle: Left ventricular ejection fraction, by estimation, is 60 to 65%. Left ventricular ejection fraction by 3D volume is 60 %. The left ventricle has normal function. The left ventricle has no regional wall motion abnormalities. The average left ventricular global longitudinal strain is -20.3 %. The global longitudinal strain is normal. The left ventricular internal cavity size was normal in size. There is mild concentric left ventricular hypertrophy. Left ventricular diastolic parameters are consistent with Grade I diastolic dysfunction (impaired relaxation). Right Ventricle: The right ventricular size is normal. No increase in right ventricular wall thickness. Right ventricular systolic function is mildly reduced. Left Atrium: Left atrial size was normal in size. Right Atrium: Right atrial size was normal in size.  Pericardium: There is no evidence of pericardial effusion. Mitral Valve: The mitral valve is normal in structure. Mild mitral valve regurgitation. No evidence of mitral valve stenosis. Tricuspid Valve: The tricuspid valve is normal in structure. Tricuspid valve regurgitation is mild . No evidence of tricuspid stenosis. Aortic Valve: The aortic valve is normal in structure. Aortic valve regurgitation is not visualized. No aortic stenosis is present. Pulmonic Valve: The pulmonic valve  was doming in systole. Pulmonic valve regurgitation is moderate to severe. No evidence of pulmonic stenosis. Aorta: The aortic root is normal in size and structure. Venous: The inferior vena cava is normal in size with greater than 50% respiratory variability, suggesting right atrial pressure of 3 mmHg. IAS/Shunts: No atrial level shunt detected by color flow Doppler.  LEFT VENTRICLE PLAX 2D LVIDd:         3.49 cm         Diastology LVIDs:         1.94 cm         LV e' medial:    6.31 cm/s LV PW:         1.09 cm         LV E/e' medial:  12.8 LV IVS:        1.28 cm         LV e' lateral:   7.62 cm/s LVOT diam:     2.00 cm         LV E/e' lateral: 10.6 LV SV:         87 LV SV Index:   40              2D LVOT Area:     3.14 cm        Longitudinal                                Strain                                2D Strain GLS  -20.3 %                                Avg:                                 3D Volume EF                                LV 3D EF:    Left                                             ventricul                                             ar  ejection                                             fraction                                             by 3D                                             volume is                                             60 %.                                 3D Volume EF:                                3D EF:        60 %                                LV EDV:       129 ml                                LV ESV:       51 ml                                LV SV:        78 ml RIGHT VENTRICLE RV Basal diam:  4.33 cm RV Mid diam:    3.36 cm RV S prime:     14.10 cm/s TAPSE (M-mode): 2.6 cm LEFT ATRIUM             Index        RIGHT ATRIUM           Index LA diam:        4.40 cm 2.02 cm/m   RA Area:     12.30 cm LA Vol (A2C):   40.2 ml 18.49 ml/m  RA Volume:   26.50 ml  12.19 ml/m LA Vol (A4C):   24.2 ml 11.13 ml/m LA Biplane  Vol: 33.6 ml 15.45 ml/m  AORTIC VALVE LVOT Vmax:   128.00 cm/s LVOT Vmean:  83.900 cm/s LVOT VTI:    0.277 m  AORTA Ao Root diam: 3.40 cm Ao Asc diam:  3.30 cm MITRAL VALVE               TRICUSPID VALVE MV Area (PHT): 3.37 cm    TR Peak grad:   25.8 mmHg MV Decel Time: 225 msec    TR Vmax:        254.00 cm/s MV E velocity: 80.80 cm/s MV  A velocity: 77.00 cm/s  SHUNTS MV E/A ratio:  1.05        Systemic VTI:  0.28 m                            Systemic Diam: 2.00 cm Kardie Tobb DO Electronically signed by Thomasene Ripple DO Signature Date/Time: 04/04/2023/2:56:06 PM    Final    DG Lumbar Spine 2-3 Views  Result Date: 04/01/2023 CLINICAL DATA:  Low back pain EXAM: LUMBAR SPINE - 2-3 VIEW COMPARISON:  08/02/2021 FINDINGS: Grade 1 anterolisthesis L4-5 and minimal retrolisthesis L5-S1, likely degenerative, appears stable since prior examination. Otherwise normal lumbar lordosis. No acute fracture of the lumbar spine. Vertebral body height is preserved. Intervertebral disc space narrowing is seen at L4-5 and L5-S1, stable since prior examination, in keeping with changes of degenerative disc disease. Remaining intervertebral disc spaces are preserved. Facet arthrosis at L4-S1 is not well profiled on this examination. Paraspinal soft tissues are unremarkable. IMPRESSION: 1. Degenerative disc and degenerative joint disease at L4-5 and L5-S1, stable since prior examination. 2. No acute fracture or listhesis. Electronically Signed   By: Helyn Numbers M.D.   On: 04/01/2023 00:30      Assessment & Plan:   Chronic heart failure with preserved ejection fraction (HCC)- Will continue the SGLT-2 inhibitor.  Absent reflexes on examination -     MR LUMBAR SPINE WO CONTRAST; Future  Lumbar radiculitis -     MR LUMBAR SPINE WO CONTRAST; Future -     Ambulatory referral to Orthopedic Surgery  DDD (degenerative disc disease), lumbar -     MR LUMBAR SPINE WO CONTRAST; Future -     Ambulatory referral to Orthopedic Surgery      Follow-up: Return in about 3 months (around 07/06/2023).  Sanda Linger, MD

## 2023-04-06 ENCOUNTER — Telehealth: Payer: Self-pay

## 2023-04-06 NOTE — Telephone Encounter (Signed)
Transition Care Management Unsuccessful Follow-up Telephone Call  Date of discharge and from where:  04/01/2023 [facility]  Attempts:  1st Attempt  Reason for unsuccessful TCM follow-up call:  Left voice message   [facility]  [facility] Population Health Community Resource Care Guide   [email or web address]  ?? [phone]   Website: [email or web address]

## 2023-04-09 ENCOUNTER — Telehealth: Payer: Self-pay

## 2023-04-09 NOTE — Telephone Encounter (Signed)
Transition Care Management Unsuccessful Follow-up Telephone Call  Date of discharge and from where:  04/01/2023 [facility].  Attempts:  2nd Attempt  Reason for unsuccessful TCM follow-up call:  Left voice message   [facility]  [facility] Population Health Community Resource Care Guide   [email or web address]  ?? [phone]   Website: [email or web address]

## 2023-04-29 ENCOUNTER — Ambulatory Visit
Admission: RE | Admit: 2023-04-29 | Discharge: 2023-04-29 | Disposition: A | Discharge status: Home / Self Care | Payer: Medicare Other | Source: Ambulatory Visit | Attending: Internal Medicine | Admitting: Internal Medicine

## 2023-04-29 DIAGNOSIS — R292 Abnormal reflex: Secondary | ICD-10-CM

## 2023-04-29 DIAGNOSIS — M5416 Radiculopathy, lumbar region: Secondary | ICD-10-CM

## 2023-04-29 DIAGNOSIS — M5136 Other intervertebral disc degeneration, lumbar region: Secondary | ICD-10-CM

## 2023-05-17 ENCOUNTER — Encounter: Payer: Self-pay | Admitting: Internal Medicine

## 2023-06-07 ENCOUNTER — Ambulatory Visit: Payer: Medicare Other | Admitting: Internal Medicine

## 2023-07-10 ENCOUNTER — Ambulatory Visit: Payer: Medicare Other | Admitting: Internal Medicine

## 2023-07-12 ENCOUNTER — Ambulatory Visit: Payer: Medicare Other | Admitting: Internal Medicine

## 2023-12-10 ENCOUNTER — Telehealth: Payer: Self-pay | Admitting: Internal Medicine

## 2023-12-10 NOTE — Telephone Encounter (Signed)
 Contacted Brandon Shannon to schedule their annual wellness visit. Patient declined to schedule AWV at this time.Transferred care to the [place].  [facility] Care Guide [facility] AWV TEAM Direct Dial: [phone]

## 2024-06-19 ENCOUNTER — Encounter (HOSPITAL_COMMUNITY): Payer: Self-pay

## 2024-06-19 ENCOUNTER — Other Ambulatory Visit: Payer: Self-pay

## 2024-06-19 ENCOUNTER — Emergency Department (HOSPITAL_COMMUNITY)
Admission: EM | Admit: 2024-06-19 | Discharge: 2024-06-19 | Disposition: A | Discharge status: Home / Self Care | Attending: Emergency Medicine | Admitting: Emergency Medicine

## 2024-06-19 ENCOUNTER — Emergency Department (HOSPITAL_COMMUNITY)

## 2024-06-19 DIAGNOSIS — Z79899 Other long term (current) drug therapy: Secondary | ICD-10-CM | POA: Diagnosis not present

## 2024-06-19 DIAGNOSIS — K5900 Constipation, unspecified: Secondary | ICD-10-CM | POA: Insufficient documentation

## 2024-06-19 DIAGNOSIS — R102 Pelvic and perineal pain unspecified side: Secondary | ICD-10-CM | POA: Diagnosis present

## 2024-06-19 DIAGNOSIS — I509 Heart failure, unspecified: Secondary | ICD-10-CM | POA: Diagnosis not present

## 2024-06-19 DIAGNOSIS — E119 Type 2 diabetes mellitus without complications: Secondary | ICD-10-CM | POA: Insufficient documentation

## 2024-06-19 DIAGNOSIS — Z7984 Long term (current) use of oral hypoglycemic drugs: Secondary | ICD-10-CM | POA: Diagnosis not present

## 2024-06-19 DIAGNOSIS — L03317 Cellulitis of buttock: Secondary | ICD-10-CM | POA: Diagnosis not present

## 2024-06-19 DIAGNOSIS — I110 Hypertensive heart disease with heart failure: Secondary | ICD-10-CM | POA: Diagnosis not present

## 2024-06-19 LAB — I-STAT CHEM 8, ED
BUN: 19 mg/dL (ref 8–23)
Calcium, Ion: 1.14 mmol/L — ABNORMAL LOW (ref 1.15–1.40)
Chloride: 104 mmol/L (ref 98–111)
Creatinine, Ser: 1 mg/dL (ref 0.61–1.24)
Glucose, Bld: 111 mg/dL — ABNORMAL HIGH (ref 70–99)
HCT: 47 % (ref 39.0–52.0)
Hemoglobin: 16 g/dL (ref 13.0–17.0)
Potassium: 3.8 mmol/L (ref 3.5–5.1)
Sodium: 140 mmol/L (ref 135–145)
TCO2: 25 mmol/L (ref 22–32)

## 2024-06-19 LAB — URINALYSIS, ROUTINE W REFLEX MICROSCOPIC
Bacteria, UA: NONE SEEN
Bilirubin Urine: NEGATIVE
Glucose, UA: >=500 mg/dL — AB
Hgb urine dipstick: NEGATIVE
Ketones, ur: NEGATIVE mg/dL
Leukocytes,Ua: NEGATIVE
Nitrite: NEGATIVE
Protein, ur: NEGATIVE mg/dL
Specific Gravity, Urine: 1.017 (ref 1.005–1.030)
pH: 7 (ref 5.0–8.0)

## 2024-06-19 LAB — CBC WITH DIFFERENTIAL/PLATELET
Abs Immature Granulocytes: 0.04 K/uL (ref 0.00–0.07)
Basophils Absolute: 0.1 K/uL (ref 0.0–0.1)
Basophils Relative: 1 %
Eosinophils Absolute: 0.1 K/uL (ref 0.0–0.5)
Eosinophils Relative: 1 %
HCT: 47.4 % (ref 39.0–52.0)
Hemoglobin: 15.8 g/dL (ref 13.0–17.0)
Immature Granulocytes: 0 %
Lymphocytes Relative: 29 %
Lymphs Abs: 2.7 K/uL (ref 0.7–4.0)
MCH: 30.7 pg (ref 26.0–34.0)
MCHC: 33.3 g/dL (ref 30.0–36.0)
MCV: 92.2 fL (ref 80.0–100.0)
Monocytes Absolute: 0.7 K/uL (ref 0.1–1.0)
Monocytes Relative: 7 %
Neutro Abs: 5.8 K/uL (ref 1.7–7.7)
Neutrophils Relative %: 62 %
Platelets: 188 K/uL (ref 150–400)
RBC: 5.14 MIL/uL (ref 4.22–5.81)
RDW: 12.8 % (ref 11.5–15.5)
WBC: 9.3 K/uL (ref 4.0–10.5)
nRBC: 0 % (ref 0.0–0.2)

## 2024-06-19 LAB — BASIC METABOLIC PANEL WITH GFR
Anion gap: 13 (ref 5–15)
BUN: 15 mg/dL (ref 8–23)
CO2: 23 mmol/L (ref 22–32)
Calcium: 9.4 mg/dL (ref 8.9–10.3)
Chloride: 101 mmol/L (ref 98–111)
Creatinine, Ser: 1.08 mg/dL (ref 0.61–1.24)
GFR, Estimated: >60 mL/min (ref >60)
Glucose, Bld: 110 mg/dL — ABNORMAL HIGH (ref 70–99)
Potassium: 3.9 mmol/L (ref 3.5–5.1)
Sodium: 137 mmol/L (ref 135–145)

## 2024-06-19 MED ORDER — AMOXICILLIN-POT CLAVULANATE 875-125 MG PO TABS: 1.0000 | ORAL_TABLET | Freq: Two times a day (BID) | ORAL | 0 refills | Status: AC

## 2024-06-19 MED ORDER — IOHEXOL 350 MG/ML SOLN
75.0000 mL | Freq: Once | INTRAVENOUS | Status: AC | PRN
  Administered 2024-06-19: 75 mL via INTRAVENOUS

## 2024-06-19 NOTE — ED Provider Notes (Signed)
 [place] EMERGENCY DEPARTMENT AT [facility] Provider Note   CSN: [id number] Arrival date & time: 06/19/24  1158     Patient presents with: Scrotum Pain   Brandon Shannon is a 68 y.o. male.  Last colonoscopy 1-2 years ago, removed some polyps. Supposed to have the next one in 8 years.     {Add pertinent medical, surgical, social history, OB history to HPI:32947} HPI     Prior to Admission medications   Medication Sig Start Date End Date Taking? Authorizing Provider  ALPRAZolam  (XANAX ) 0.5 MG tablet Take 0.5 mg by mouth daily as needed for anxiety.    [provider]  Carboxymethylcellulose Sod PF 0.5 % SOLN Apply 1 drop to eye in the morning, at noon, and at bedtime.    [provider]  carvedilol  (COREG ) 12.5 MG tablet Take 1 tablet (12.5 mg total) by mouth 2 (two) times daily with a meal. 12/31/18   Joshua Debby CROME, MD  cetirizine (ZYRTEC) 10 MG tablet Take 1 tablet by mouth daily. 09/21/21   [provider]  cholecalciferol  (VITAMIN D3) 25 MCG (1000 UNIT) tablet Take 1,000 Units by mouth every other day.    [provider]  cyanocobalamin (VITAMIN B12) 1000 MCG tablet Take 1,000 mcg by mouth every other day.    [provider]  dexamethasone  (DECADRON ) 4 MG tablet Take 4 mg every other day as needed for severe pain, not controlled with usual medication 04/01/23   Cardama, Raynell Moder, MD  Empagliflozin -metFORMIN  HCl 12.12-998 MG TABS Take 1 tablet by mouth 2 (two) times daily.    [provider]  fluticasone (FLONASE) 50 MCG/ACT nasal spray INSTILL 2 SPRAYS IN EACH NOSTRIL TWICE A DAY 10/14/21   [provider]  gabapentin (NEURONTIN) 300 MG capsule Take 300 mg by mouth 3 (three) times daily. 10/19/22   [provider]  meloxicam  (MOBIC ) 15 MG tablet Take 15 mg by mouth daily. As needed 10/19/22   [provider]  Omeprazole  20 MG TBEC TAKE 1 CAPSULE BY MOUTH IN THE MORNING AS NEEDED 01/12/20    [provider]  rosuvastatin  (CRESTOR ) 40 MG tablet Take 1 tablet (40 mg total) by mouth daily. 03/07/23   Joshua Debby CROME, MD  sildenafil  (VIAGRA ) 100 MG tablet TAKE ONE TABLET BY MOUTH AS INSTRUCTED AS NEEDED (TAKE 1 HOUR PRIOR TO SEXUAL ACTIVITY *DO NOT EXCEED 1 DOSE PER 24 HOUR PERIOD*) 09/21/21   [provider]  tamsulosin  (FLOMAX ) 0.4 MG CAPS capsule Take 1 capsule (0.4 mg total) by mouth daily after supper. 09/07/16   Joshua Debby CROME, MD    Allergies: Atorvastatin  and Pravastatin    Review of Systems  Updated Vital Signs BP (!) 142/89 (BP Location: Right Arm)   Pulse 74   Temp 97.6 F (36.4 C) (Oral)   Resp 17   Ht 5' 9 (1.753 m)   Wt 97.5 kg   SpO2 100%   BMI 31.75 kg/m   Physical Exam  (all labs ordered are listed, but only abnormal results are displayed) Labs Reviewed  BASIC METABOLIC PANEL WITH GFR - Abnormal; Notable for the following components:      Result Value   Glucose, Bld 110 (*)    All other components within normal limits  URINALYSIS, ROUTINE W REFLEX MICROSCOPIC - Abnormal; Notable for the following components:   Glucose, UA >=500 (*)    All other components within normal limits  I-STAT CHEM 8, ED - Abnormal; Notable for  the following components:   Glucose, Bld 111 (*)    Calcium , Ion 1.14 (*)    All other components within normal limits  CBC WITH DIFFERENTIAL/PLATELET  I-STAT CHEM 8, ED    EKG: None  Radiology: CT ABDOMEN PELVIS W CONTRAST Result Date: 06/19/2024 EXAM: CT ABDOMEN AND PELVIS WITH CONTRAST 06/19/2024 01:43:46 PM TECHNIQUE: CT of the abdomen and pelvis was performed with the administration of 75 mL of iohexol  (OMNIPAQUE ) 350 MG/ML injection. Multiplanar reformatted images are provided for review. Automated exposure control, iterative reconstruction, and/or weight-based adjustment of the mA/kV was utilized to reduce the radiation dose to as low as reasonably achievable. COMPARISON: CTA 01/14/2022. CLINICAL HISTORY:  Concern for abscess in the perineum area. Scrotum pain. FINDINGS: LOWER CHEST: No acute abnormality. LIVER: Tiny low-density right hepatic lobe lesion too small to characterize but most likely a cyst. GALLBLADDER AND BILE DUCTS: Gallbladder is unremarkable. No biliary ductal dilatation. SPLEEN: No acute abnormality. PANCREAS: No acute abnormality. ADRENAL GLANDS: No acute abnormality. KIDNEYS, URETERS AND BLADDER: . Lower pole right renal punctate collecting system calculus. Right renal lesion too small to characterize; the lesion is most likely a cyst and does not warrant specific imaging follow-up. No hydronephrosis. No perinephric or periureteral stranding. Urinary bladder is unremarkable. GI AND BOWEL: Normal stomach, without wall thickening. Moderate stool throughout the colon. Colonic stool burden suggests constipation. An area of soft tissue thickening within the proximal ascending colon including on image 43 of 3. No upstream obstruction. Normal terminal ileum. Appendix not visualized, but no right lower quadrant inflammation seen. Normal small bowel. There is no bowel obstruction. PERITONEUM AND RETROPERITONEUM: No significant free fluid. No free intraperitoneal air. VASCULATURE: Aortic atherosclerosis. Aorta is normal in caliber. LYMPH NODES: No lymphadenopathy. REPRODUCTIVE ORGANS: Small left scrotal hydrocele. Prostate is unremarkable. BONES AND SOFT TISSUES: Grade 1 anterolisthesis of L4 on L5 secondary to facet disease. Trace L4-S1 anterolisthesis. Lumbosacral spondylosis. Mild right convex lumbar spine curvature. Small fat containing left inguinal hernia. Subcutaneous edema is relatively mild about the posterior right perineum and gluteal crease including on image 92/3. There may be mild edema about the left gluteal region on image 85/3. No drainable fluid collection or subcutaneous gas. IMPRESSION: 1. No evidence of Fournier'sgangrene. Right and possible left-. sided subcutaneous edema about the  posterior perineum and gluteal crease. This could represent cellulitis. 2. Ascending colon apparent soft tissue fullness which could be secondary to underdistention. Correlate with colon cancer screening history and if not up to date, recommend colonoscopy with attention to the ascending colon. 3. Colonic stool burden suggests constipation. 4. Right nephrolithiasis. 5. Aortic atherosclerosis Electronically signed by: Rockey Kilts MD 06/19/2024 02:26 PM EDT RP Workstation: [id number]    {Document cardiac monitor, telemetry assessment procedure when appropriate:32947} Procedures   Medications Ordered in the ED  iohexol  (OMNIPAQUE ) 350 MG/ML injection 75 mL (75 mLs Intravenous Contrast Given 06/19/24 1344)      {Click here for ABCD2, HEART and other calculators REFRESH Note before signing:1}                              Medical Decision Making Amount and/or Complexity of Data Reviewed Radiology: ordered.   ***  {Document critical care time when appropriate  Document review of labs and clinical decision tools ie CHADS2VASC2, etc  Document your independent review of radiology images and any outside records  Document your discussion with family members, caretakers and with consultants  Document social determinants  of health affecting pt's care  Document your decision making why or why not admission, treatments were needed:32947:::1}   Final diagnoses:  None    ED Discharge Orders     None

## 2024-06-19 NOTE — ED Provider Triage Note (Cosign Needed Addendum)
 Emergency Medicine Provider Triage Evaluation Note  Brandon Shannon , a 68 y.o. male  was evaluated in triage.  Pt complains of perineum pain and redness that began a few days ago.  No evidence of obvious abscess.  No testicular pain or swelling.  No abdominal pain.  Patient states he was seen at the [facility] this morning and was referred to come to the emergency department for further evaluation.  Review of Systems  Positive: Pain, tenderness, redness Negative: Fever, chills, body aches  Physical Exam  BP (!) 159/97 (BP Location: Right Arm)   Pulse 73   Temp 97.9 F (36.6 C) (Oral)   Resp 18   Ht 5' 9 (1.753 m)   Wt 97.5 kg   SpO2 98%   BMI 31.75 kg/m  Gen:   Awake, no distress   Resp:  Normal effort  MSK:   Moves extremities without difficulty Other:  Tenderness to perineum.  No fluctuance or obvious abscess noted.  No pus, drainage.  Medical Decision Making  Medically screening exam initiated at 12:40 PM.  Appropriate orders placed.  Caelin A Cubit was informed that the remainder of the evaluation will be completed by another provider, this initial triage assessment does not replace that evaluation, and the importance of remaining in the ED until their evaluation is complete.  CT scan ordered.  Concern for Fournier's gangrene in setting of perianal pain and poorly controlled diabetes.  No concern for testicular torsion at this time.   Torrence Marry RAMAN, PA-C 06/19/24 1241    Torrence Marry RAMAN, PA-C 06/19/24 1258

## 2024-06-19 NOTE — ED Triage Notes (Signed)
 Patient was referred from the [place] [facility] to come be evaluated for scrotum pain that has been ongoing for a week. He states that the area is red. Denies swelling and injury. Denies issues with urination.

## 2024-06-19 NOTE — Discharge Instructions (Addendum)
 You were seen in the ER today for evaluation of your perineal pain. It appears that there may be some early infection. For this, I am prescribing you an antibiotic to take for the the next week. Please finish the entirety of the course. For the skin irritation, please use a cream called Desitin (preferably the purple tube). Make sure the area is stay clean and dry. Additionally, you are constipated. I recommend taking Miralax 1 scoop daily. Make sure that you are staying hydrated. Please follow up with your PCP on Monday for re-evaluation of the area. If you start to have any fever, pain to the area, trouble having a bowel movement, trouble urinating, swelling, please return to the ER for re-evaluation. If you have any other concerns, new or worsening symptoms, please return to the ER for re-evaluation.   Contact a doctor if: You have a fever. You do not start to get better after 1-2 days of treatment. Your bone or joint under the infected area starts to hurt after the skin has healed. Your infection comes back in the same area or another area. Signs of this may include: You have a swollen bump in the area. Your red area gets larger, turns dark in color, or hurts more. You have more fluid coming from the wound. Pus or a bad smell develops in your infected area. You have more pain. You feel sick and have muscle aches and weakness. You develop vomiting or watery poop that will not go away. Get help right away if: You see red streaks coming from the area. You notice the skin turns purple or black and falls off. These symptoms may be an emergency. Get help right away. Call 911. Do not wait to see if the symptoms will go away. Do not drive yourself to the hospital.
